# Patient Record
Sex: Female | Born: 1970
Health system: Southern US, Community
[De-identification: ages and names within clinical notes are randomized; demographics above are authoritative.]

## PROBLEM LIST (undated history)

## (undated) DIAGNOSIS — F988 Other specified behavioral and emotional disorders with onset usually occurring in childhood and adolescence: Secondary | ICD-10-CM

## (undated) DIAGNOSIS — I10 Essential (primary) hypertension: Secondary | ICD-10-CM

## (undated) DIAGNOSIS — M549 Dorsalgia, unspecified: Secondary | ICD-10-CM

## (undated) DIAGNOSIS — C539 Malignant neoplasm of cervix uteri, unspecified: Secondary | ICD-10-CM

## (undated) DIAGNOSIS — C55 Malignant neoplasm of uterus, part unspecified: Secondary | ICD-10-CM

## (undated) DIAGNOSIS — J302 Other seasonal allergic rhinitis: Secondary | ICD-10-CM

## (undated) DIAGNOSIS — F329 Major depressive disorder, single episode, unspecified: Secondary | ICD-10-CM

## (undated) DIAGNOSIS — B019 Varicella without complication: Secondary | ICD-10-CM

## (undated) DIAGNOSIS — K829 Disease of gallbladder, unspecified: Secondary | ICD-10-CM

## (undated) DIAGNOSIS — K219 Gastro-esophageal reflux disease without esophagitis: Secondary | ICD-10-CM

## (undated) DIAGNOSIS — F419 Anxiety disorder, unspecified: Secondary | ICD-10-CM

## (undated) HISTORY — DX: Malignant neoplasm of cervix uteri, unspecified: C53.9

## (undated) HISTORY — DX: Essential (primary) hypertension: I10

## (undated) HISTORY — DX: Anxiety disorder, unspecified: F41.9

## (undated) HISTORY — DX: Gastro-esophageal reflux disease without esophagitis: K21.9

## (undated) HISTORY — DX: Other seasonal allergic rhinitis: J30.2

## (undated) HISTORY — DX: Other specified behavioral and emotional disorders with onset usually occurring in childhood and adolescence: F98.8

## (undated) HISTORY — DX: Dorsalgia, unspecified: M54.9

## (undated) HISTORY — PX: CHOLECYSTECTOMY: SHX55

## (undated) HISTORY — DX: Varicella without complication: B01.9

## (undated) HISTORY — PX: ANKLE SURGERY: SHX546

## (undated) HISTORY — DX: Disease of gallbladder, unspecified: K82.9

## (undated) HISTORY — DX: Major depressive disorder, single episode, unspecified: F32.9

## (undated) HISTORY — PX: LEG SURGERY: SHX1003

## (undated) HISTORY — DX: Malignant neoplasm of uterus, part unspecified: C55

---

## 2000-01-12 ENCOUNTER — Other Ambulatory Visit: Admission: RE | Admit: 2000-01-12 | Discharge: 2000-01-12 | Payer: Self-pay | Admitting: Obstetrics and Gynecology

## 2001-02-07 ENCOUNTER — Other Ambulatory Visit: Admission: RE | Admit: 2001-02-07 | Discharge: 2001-02-07 | Payer: Self-pay | Admitting: Obstetrics and Gynecology

## 2002-03-27 ENCOUNTER — Other Ambulatory Visit: Admission: RE | Admit: 2002-03-27 | Discharge: 2002-03-27 | Payer: Self-pay | Admitting: Obstetrics and Gynecology

## 2003-05-05 ENCOUNTER — Other Ambulatory Visit: Admission: RE | Admit: 2003-05-05 | Discharge: 2003-05-05 | Payer: Self-pay | Admitting: Obstetrics and Gynecology

## 2004-05-19 ENCOUNTER — Other Ambulatory Visit: Admission: RE | Admit: 2004-05-19 | Discharge: 2004-05-19 | Payer: Self-pay | Admitting: Obstetrics and Gynecology

## 2005-05-25 ENCOUNTER — Other Ambulatory Visit: Admission: RE | Admit: 2005-05-25 | Discharge: 2005-05-25 | Payer: Self-pay | Admitting: Obstetrics and Gynecology

## 2005-06-23 ENCOUNTER — Observation Stay (HOSPITAL_COMMUNITY): Admission: EM | Admit: 2005-06-23 | Discharge: 2005-06-23 | Payer: Self-pay | Admitting: Emergency Medicine

## 2006-01-11 ENCOUNTER — Ambulatory Visit: Payer: Self-pay | Admitting: Family Medicine

## 2006-02-15 ENCOUNTER — Ambulatory Visit: Payer: Self-pay | Admitting: Family Medicine

## 2006-08-23 ENCOUNTER — Ambulatory Visit: Payer: Self-pay | Admitting: Family Medicine

## 2006-12-25 DIAGNOSIS — M869 Osteomyelitis, unspecified: Secondary | ICD-10-CM | POA: Insufficient documentation

## 2007-01-17 ENCOUNTER — Ambulatory Visit: Payer: Self-pay | Admitting: Family Medicine

## 2007-02-27 DIAGNOSIS — F32A Depression, unspecified: Secondary | ICD-10-CM

## 2007-02-27 HISTORY — DX: Depression, unspecified: F32.A

## 2007-10-26 ENCOUNTER — Emergency Department (HOSPITAL_COMMUNITY): Admission: EM | Admit: 2007-10-26 | Discharge: 2007-10-26 | Payer: Self-pay | Admitting: Emergency Medicine

## 2007-11-18 ENCOUNTER — Ambulatory Visit: Payer: Self-pay | Admitting: Family Medicine

## 2007-11-18 DIAGNOSIS — R300 Dysuria: Secondary | ICD-10-CM | POA: Insufficient documentation

## 2007-11-18 LAB — CONVERTED CEMR LAB
Bilirubin Urine: NEGATIVE
Blood in Urine, dipstick: NEGATIVE
Ketones, urine, test strip: NEGATIVE
Nitrite: NEGATIVE
Protein, U semiquant: NEGATIVE
Specific Gravity, Urine: <1.005
Urobilinogen, UA: 0.2
WBC Urine, dipstick: NEGATIVE

## 2007-11-19 ENCOUNTER — Encounter: Payer: Self-pay | Admitting: Family Medicine

## 2007-11-21 ENCOUNTER — Encounter (INDEPENDENT_AMBULATORY_CARE_PROVIDER_SITE_OTHER): Payer: Self-pay | Admitting: *Deleted

## 2007-11-26 ENCOUNTER — Encounter: Payer: Self-pay | Admitting: Family Medicine

## 2008-02-27 DIAGNOSIS — C55 Malignant neoplasm of uterus, part unspecified: Secondary | ICD-10-CM

## 2008-02-27 DIAGNOSIS — C539 Malignant neoplasm of cervix uteri, unspecified: Secondary | ICD-10-CM

## 2008-02-27 HISTORY — PX: ABDOMINAL HYSTERECTOMY: SHX81

## 2008-02-27 HISTORY — DX: Malignant neoplasm of cervix uteri, unspecified: C53.9

## 2008-02-27 HISTORY — DX: Malignant neoplasm of uterus, part unspecified: C55

## 2008-06-02 ENCOUNTER — Ambulatory Visit: Payer: Self-pay | Admitting: Family Medicine

## 2008-06-02 DIAGNOSIS — R3915 Urgency of urination: Secondary | ICD-10-CM | POA: Insufficient documentation

## 2008-06-02 LAB — CONVERTED CEMR LAB
Bilirubin Urine: NEGATIVE
Blood in Urine, dipstick: NEGATIVE
Protein, U semiquant: NEGATIVE
Specific Gravity, Urine: >=1.03
Urobilinogen, UA: NEGATIVE
WBC Urine, dipstick: NEGATIVE

## 2008-06-03 ENCOUNTER — Encounter: Payer: Self-pay | Admitting: Family Medicine

## 2008-06-07 ENCOUNTER — Encounter (INDEPENDENT_AMBULATORY_CARE_PROVIDER_SITE_OTHER): Payer: Self-pay | Admitting: *Deleted

## 2008-06-24 ENCOUNTER — Ambulatory Visit: Payer: Self-pay | Admitting: Family Medicine

## 2008-06-24 DIAGNOSIS — F411 Generalized anxiety disorder: Secondary | ICD-10-CM

## 2008-06-24 LAB — CONVERTED CEMR LAB
ALT: 20 units/L (ref 0–35)
AST: 21 units/L (ref 0–37)
Alkaline Phosphatase: 66 units/L (ref 39–117)
Bilirubin Urine: NEGATIVE
CO2: 27 meq/L (ref 19–32)
Chloride: 110 meq/L (ref 96–112)
Cholesterol: 150 mg/dL (ref 0–200)
Creatinine, Ser: 0.9 mg/dL (ref 0.4–1.2)
GFR calc non Af Amer: 74.43 mL/min (ref >60)
Glucose, Bld: 99 mg/dL (ref 70–99)
HDL: 54.2 mg/dL (ref >39.00)
LDL Cholesterol: 82 mg/dL (ref 0–99)
Monocytes Absolute: 0.3 10*3/uL (ref 0.1–1.0)
Monocytes Relative: 5.3 % (ref 3.0–12.0)
Neutro Abs: 4.8 10*3/uL (ref 1.4–7.7)
Sodium: 141 meq/L (ref 135–145)
Specific Gravity, Urine: 1.02
TSH: 1.86 microintl units/mL (ref 0.35–5.50)
Total Bilirubin: 0.8 mg/dL (ref 0.3–1.2)
Total CHOL/HDL Ratio: 3
Total Protein: 6.7 g/dL (ref 6.0–8.3)
Triglycerides: 70 mg/dL (ref 0.0–149.0)
Urobilinogen, UA: NEGATIVE
VLDL: 14 mg/dL (ref 0.0–40.0)
WBC: 6.5 10*3/uL (ref 4.5–10.5)

## 2008-06-25 ENCOUNTER — Encounter (INDEPENDENT_AMBULATORY_CARE_PROVIDER_SITE_OTHER): Payer: Self-pay | Admitting: *Deleted

## 2008-07-16 ENCOUNTER — Encounter: Admission: RE | Admit: 2008-07-16 | Discharge: 2008-07-16 | Payer: Self-pay | Admitting: Obstetrics and Gynecology

## 2008-11-15 ENCOUNTER — Ambulatory Visit (HOSPITAL_COMMUNITY): Admission: RE | Admit: 2008-11-15 | Discharge: 2008-11-15 | Payer: Self-pay | Admitting: Obstetrics and Gynecology

## 2008-11-15 ENCOUNTER — Encounter (INDEPENDENT_AMBULATORY_CARE_PROVIDER_SITE_OTHER): Payer: Self-pay | Admitting: Obstetrics and Gynecology

## 2008-12-10 ENCOUNTER — Ambulatory Visit (HOSPITAL_COMMUNITY): Admission: RE | Admit: 2008-12-10 | Discharge: 2008-12-11 | Payer: Self-pay | Admitting: Obstetrics and Gynecology

## 2008-12-10 ENCOUNTER — Encounter (INDEPENDENT_AMBULATORY_CARE_PROVIDER_SITE_OTHER): Payer: Self-pay | Admitting: Obstetrics and Gynecology

## 2009-02-24 ENCOUNTER — Telehealth (INDEPENDENT_AMBULATORY_CARE_PROVIDER_SITE_OTHER): Payer: Self-pay | Admitting: *Deleted

## 2009-11-23 ENCOUNTER — Encounter: Admission: RE | Admit: 2009-11-23 | Discharge: 2009-11-23 | Payer: Self-pay | Admitting: Obstetrics and Gynecology

## 2010-06-01 LAB — CBC
Hemoglobin: 11 g/dL — ABNORMAL LOW (ref 12.0–15.0)
MCHC: 34.3 g/dL (ref 30.0–36.0)
MCHC: 34.5 g/dL (ref 30.0–36.0)
MCV: 86.8 fL (ref 78.0–100.0)
Platelets: 305 10*3/uL (ref 150–400)
RBC: 3.69 MIL/uL — ABNORMAL LOW (ref 3.87–5.11)
RDW: 12.8 % (ref 11.5–15.5)

## 2010-06-01 LAB — PREGNANCY, URINE: Preg Test, Ur: NEGATIVE

## 2010-06-01 LAB — TYPE AND SCREEN: ABO/RH(D): A NEG

## 2010-06-01 LAB — ABO/RH: ABO/RH(D): A NEG

## 2010-06-02 LAB — CBC
HCT: 40 % (ref 36.0–46.0)
MCV: 86.1 fL (ref 78.0–100.0)
RDW: 12.9 % (ref 11.5–15.5)

## 2010-07-14 NOTE — Op Note (Signed)
NAME:  Kristi Ramos, Kristi Ramos NO.:  [id number]   MEDICAL RECORD NO.:  [id number]          PATIENT TYPE:  OBV   LOCATION:  1619                         FACILITY:  [facility]   PHYSICIAN:  Sharolyn Douglas, M.D.        DATE OF BIRTH:  [birth year]   DATE OF PROCEDURE:  06/23/2005  DATE OF DISCHARGE:                                 OPERATIVE REPORT   DIAGNOSIS:  Open right non displaced tibia fracture secondary to projectile  foreign body.   PROCEDURE:  1.  Incision and drainage of right tibia open fracture wound.  2.  Exploration of right posterior calf for foreign body.   SURGEON:  Sharolyn Douglas, MD   ASSISTANT:  Verlin Fester, P.A.   ANESTHESIA:  General endotracheal.   ESTIMATED BLOOD LOSS:  Minimal.   COMPLICATIONS:  None.   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  She felt severe immediate  pain in her right calf.  She looked down and there was a small pinpoint  wound.  She was taken to [facility].  She had x-rays and CT scan  made.  There was evidence of gas in the soft tissues and muscle with a chip  fracture off the tibia.  There appeared to be a small subcutaneous foreign  body in the posterior lateral calf.  We discussed various treatment  modalities including doing nothing and treating with antibiotics versus  I&Ding the open fracture and exploring for the form body.  She was having  significant pain in the emergency room and felt like she could feel  something moving around.  She has elected to proceed with exploration and  I&D of the fracture.  I discussed the risk and benefits.   PROCEDURE:  She was identified in holding area, taken to the operating room  underwent general endotracheal anesthesia without difficulty, given  prophylactic IV antibiotics in the emergency room and also a tetanus shot.  The right lower extremity was prepped and draped usual sterile fashion.   The  we made 1 cm incision over the posterior lateral calf at the site of maximal  tenderness which was identified in the holding area.  There is a small area  of ecchymosis.  The subcutaneous tissues were explored.  We could not  identify any foreign body.  The small wound was irrigated.  Closed with 4-0  interrupted nylon suture.  We then turned our attention to the open fracture  wound.  The laceration was extended in each direction 1 cm.  The edges of  the laceration were excised back to fresh skin.  We explored the wound and  there was a tract down to the tibia which had been dissected by the foreign  body.  The wound was irrigated with copious amounts of irrigation.  All  necrotic tissue was debrided.  We then closed the surgical incision with 4-0  nylon suture.  The penetrating wound was closed very loosely with the nylon  suture.  Sterile dressing was applied.  The  patient was extubated without difficulty,  transferred to recovery in stable condition.  I spoke with Dr. Ninetta Lights from  the infectious disease and he recommended Augmentin.  We will follow her  wound closely in the postoperative period for any signs of infection.      Sharolyn Douglas, M.D.  Electronically Signed     MC/MEDQ  D:  06/23/2005  T:  06/25/2005  Job:  [id number]

## 2010-07-14 NOTE — Op Note (Signed)
NAME:  Kristi Ramos, Kristi Ramos              ACCOUNT NO.:  [id number]   MEDICAL RECORD NO.:  [id number]          PATIENT TYPE:  OBV   LOCATION:  1619                         FACILITY:  [facility]   PHYSICIAN:  Max Cohen, M.D.        DATE OF BIRTH:  05/10/1970   DATE OF PROCEDURE:  06/23/2005  DATE OF DISCHARGE:                                 OPERATIVE REPORT   DIAGNOSIS:  Open right non displaced tibia fracture secondary to projectile  foreign body.   PROCEDURE:  1.  Incision and drainage of right tibia open fracture wound.  2.  Exploration of right posterior calf for foreign body.   SURGEON:  Max Cohen, MD   ASSISTANT:  Colleen Mahar, P.A.   ANESTHESIA:  General endotracheal.   ESTIMATED BLOOD LOSS:  Minimal.   COMPLICATIONS:  None.   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  She felt severe immediate  pain in her right calf.  She looked down and there was a small pinpoint  wound.  She was taken to [facility].  She had x-rays and CT scan  made.  There was evidence of gas in the soft tissues and muscle with a chip  fracture off the tibia.  There appeared to be a small subcutaneous foreign  body in the posterior lateral calf.  We discussed various treatment  modalities including doing nothing and treating with antibiotics versus  I&Ding the open fracture and exploring for the form body.  She was having  significant pain in the emergency room and felt like she could feel  something moving around.  She has elected to proceed with exploration and  I&D of the fracture.  I discussed the risk and benefits.   PROCEDURE:  She was identified in holding area, taken to the operating room  underwent general endotracheal anesthesia without difficulty, given  prophylactic IV antibiotics in the emergency room and also a tetanus shot.  The right lower extremity was prepped and draped usual sterile fashion.   The  we made 1 cm incision over the posterior lateral calf at the site of maximal  tenderness which was identified in the holding area.  There is a small area  of ecchymosis.  The subcutaneous tissues were explored.  We could not  identify any foreign body.  The small wound was irrigated.  Closed with 4-0  interrupted nylon suture.  We then turned our attention to the open fracture  wound.  The laceration was extended in each direction 1 cm.  The edges of  the laceration were excised back to fresh skin.  We explored the wound and  there was a tract down to the tibia which had been dissected by the foreign  body.  The wound was irrigated with copious amounts of irrigation.  All  necrotic tissue was debrided.  We then closed the surgical incision with 4-0  nylon suture.  The penetrating wound was closed very loosely with the nylon  suture.  Sterile dressing was applied.  The   patient was extubated without difficulty,  transferred to recovery in stable condition.  I spoke with Dr. Hatcher from  the infectious disease and he recommended Augmentin.  We will follow her  wound closely in the postoperative period for any signs of infection.      Max Cohen, M.D.  Electronically Signed     MC/MEDQ  D:  06/23/2005  T:  06/25/2005  Job:  [id number] 

## 2011-01-29 ENCOUNTER — Other Ambulatory Visit: Payer: Self-pay | Admitting: Obstetrics and Gynecology

## 2011-01-29 DIAGNOSIS — Z1231 Encounter for screening mammogram for malignant neoplasm of breast: Secondary | ICD-10-CM

## 2011-02-21 ENCOUNTER — Ambulatory Visit
Admission: RE | Admit: 2011-02-21 | Discharge: 2011-02-21 | Disposition: A | Discharge status: Home / Self Care | Payer: 59 | Source: Ambulatory Visit | Attending: Obstetrics and Gynecology | Admitting: Obstetrics and Gynecology

## 2011-02-21 DIAGNOSIS — Z1231 Encounter for screening mammogram for malignant neoplasm of breast: Secondary | ICD-10-CM

## 2012-01-15 ENCOUNTER — Other Ambulatory Visit: Payer: Self-pay | Admitting: Obstetrics and Gynecology

## 2012-01-15 DIAGNOSIS — Z1231 Encounter for screening mammogram for malignant neoplasm of breast: Secondary | ICD-10-CM

## 2012-02-22 ENCOUNTER — Ambulatory Visit: Payer: 59

## 2013-11-20 ENCOUNTER — Ambulatory Visit (INDEPENDENT_AMBULATORY_CARE_PROVIDER_SITE_OTHER): Payer: BC Managed Care – PPO | Admitting: Family Medicine

## 2013-11-20 ENCOUNTER — Ambulatory Visit (HOSPITAL_BASED_OUTPATIENT_CLINIC_OR_DEPARTMENT_OTHER)
Admission: RE | Admit: 2013-11-20 | Discharge: 2013-11-20 | Disposition: A | Discharge status: Home / Self Care | Payer: BC Managed Care – PPO | Source: Ambulatory Visit | Attending: Family Medicine | Admitting: Family Medicine

## 2013-11-20 ENCOUNTER — Encounter: Payer: Self-pay | Admitting: Family Medicine

## 2013-11-20 ENCOUNTER — Other Ambulatory Visit: Payer: Self-pay | Admitting: Family Medicine

## 2013-11-20 VITALS — BP 147/90 | HR 72 | Temp 98.3°F | Ht 63.0 in | Wt 237.4 lb

## 2013-11-20 DIAGNOSIS — Z1231 Encounter for screening mammogram for malignant neoplasm of breast: Secondary | ICD-10-CM | POA: Diagnosis present

## 2013-11-20 DIAGNOSIS — Z0000 Encounter for general adult medical examination without abnormal findings: Secondary | ICD-10-CM

## 2013-11-20 DIAGNOSIS — I10 Essential (primary) hypertension: Secondary | ICD-10-CM

## 2013-11-20 DIAGNOSIS — K219 Gastro-esophageal reflux disease without esophagitis: Secondary | ICD-10-CM

## 2013-11-20 DIAGNOSIS — Z23 Encounter for immunization: Secondary | ICD-10-CM

## 2013-11-20 DIAGNOSIS — G4700 Insomnia, unspecified: Secondary | ICD-10-CM

## 2013-11-20 LAB — CBC WITH DIFFERENTIAL/PLATELET
BASOS ABS: 0 10*3/uL (ref 0.0–0.1)
BASOS PCT: 0.5 % (ref 0.0–3.0)
EOS ABS: 0.2 10*3/uL (ref 0.0–0.7)
Eosinophils Relative: 2.8 % (ref 0.0–5.0)
HCT: 36.2 % (ref 36.0–46.0)
HEMOGLOBIN: 12.3 g/dL (ref 12.0–15.0)
LYMPHS ABS: 1.3 10*3/uL (ref 0.7–4.0)
Lymphocytes Relative: 16.7 % (ref 12.0–46.0)
MCHC: 33.9 g/dL (ref 30.0–36.0)
MCV: 87.3 fl (ref 78.0–100.0)
MONO ABS: 0.3 10*3/uL (ref 0.1–1.0)
MONOS PCT: 4.4 % (ref 3.0–12.0)
NEUTROS ABS: 5.8 10*3/uL (ref 1.4–7.7)
NEUTROS PCT: 75.6 % (ref 43.0–77.0)
PLATELETS: 279 10*3/uL (ref 150.0–400.0)
RBC: 4.15 Mil/uL (ref 3.87–5.11)
RDW: 13.4 % (ref 11.5–15.5)
WBC: 7.6 10*3/uL (ref 4.0–10.5)

## 2013-11-20 LAB — HEPATIC FUNCTION PANEL
ALT: 49 U/L — ABNORMAL HIGH (ref 0–35)
AST: 28 U/L (ref 0–37)
Albumin: 3.9 g/dL (ref 3.5–5.2)
Alkaline Phosphatase: 80 U/L (ref 39–117)
Bilirubin, Direct: 0 mg/dL (ref 0.0–0.3)
Total Bilirubin: 0.3 mg/dL (ref 0.2–1.2)
Total Protein: 6.8 g/dL (ref 6.0–8.3)

## 2013-11-20 LAB — BASIC METABOLIC PANEL
BUN: 12 mg/dL (ref 6–23)
CO2: 24 mEq/L (ref 19–32)
Calcium: 8.7 mg/dL (ref 8.4–10.5)
Chloride: 104 mEq/L (ref 96–112)
Creatinine, Ser: 1 mg/dL (ref 0.4–1.2)
GFR: 63.42 mL/min (ref >60.00)
GLUCOSE: 117 mg/dL — AB (ref 70–99)
Potassium: 3.7 mEq/L (ref 3.5–5.1)
Sodium: 135 mEq/L (ref 135–145)

## 2013-11-20 LAB — TSH: TSH: 0.96 u[IU]/mL (ref 0.35–4.50)

## 2013-11-20 LAB — LIPID PANEL
CHOL/HDL RATIO: 3
CHOLESTEROL: 172 mg/dL (ref 0–200)
HDL: 56.4 mg/dL (ref >39.00)
LDL Cholesterol: 101 mg/dL — ABNORMAL HIGH (ref 0–99)
NonHDL: 115.6
Triglycerides: 75 mg/dL (ref 0.0–149.0)
VLDL: 15 mg/dL (ref 0.0–40.0)

## 2013-11-20 LAB — POCT URINALYSIS DIPSTICK
Bilirubin, UA: NEGATIVE
Glucose, UA: NEGATIVE
Ketones, UA: NEGATIVE
Leukocytes, UA: NEGATIVE
NITRITE UA: NEGATIVE
PH UA: 5
PROTEIN UA: NEGATIVE
RBC UA: NEGATIVE
Spec Grav, UA: >=1.03
Urobilinogen, UA: 2

## 2013-11-20 MED ORDER — OMEPRAZOLE 20 MG PO CPDR: 20.0000 mg | DELAYED_RELEASE_CAPSULE | Freq: Every day | ORAL | Status: DC

## 2013-11-20 MED ORDER — TRAZODONE HCL 50 MG PO TABS: 25.0000 mg | ORAL_TABLET | Freq: Every evening | ORAL | Status: DC | PRN

## 2013-11-20 MED ORDER — LISINOPRIL-HYDROCHLOROTHIAZIDE 10-12.5 MG PO TABS: 1.0000 | ORAL_TABLET | Freq: Every day | ORAL | Status: DC

## 2013-11-20 NOTE — Progress Notes (Signed)
Pre visit review using our clinic review tool, if applicable. No additional management support is needed unless otherwise documented below in the visit note. 

## 2013-11-20 NOTE — Patient Instructions (Signed)
Preventive Care for Adults A healthy lifestyle and preventive care can promote health and wellness. Preventive health guidelines for women include the following key practices.  A routine yearly physical is a good way to check with your health care provider about your health and preventive screening. It is a chance to share any concerns and updates on your health and to receive a thorough exam.  Visit your dentist for a routine exam and preventive care every 6 months. Brush your teeth twice a day and floss once a day. Good oral hygiene prevents tooth decay and gum disease.  The frequency of eye exams is based on your age, health, family medical history, use of contact lenses, and other factors. Follow your health care provider's recommendations for frequency of eye exams.  Eat a healthy diet. Foods like vegetables, fruits, whole grains, low-fat dairy products, and lean protein foods contain the nutrients you need without too many calories. Decrease your intake of foods high in solid fats, added sugars, and salt. Eat the right amount of calories for you.Get information about a proper diet from your health care provider, if necessary.  Regular physical exercise is one of the most important things you can do for your health. Most adults should get at least 150 minutes of moderate-intensity exercise (any activity that increases your heart rate and causes you to sweat) each week. In addition, most adults need muscle-strengthening exercises on 2 or more days a week.  Maintain a healthy weight. The body mass index (BMI) is a screening tool to identify possible weight problems. It provides an estimate of body fat based on height and weight. Your health care provider can find your BMI and can help you achieve or maintain a healthy weight.For adults 20 years and older:  A BMI below 18.5 is considered underweight.  A BMI of 18.5 to 24.9 is normal.  A BMI of 25 to 29.9 is considered overweight.  A BMI of  30 and above is considered obese.  Maintain normal blood lipids and cholesterol levels by exercising and minimizing your intake of saturated fat. Eat a balanced diet with plenty of fruit and vegetables. Blood tests for lipids and cholesterol should begin at age 76 and be repeated every 5 years. If your lipid or cholesterol levels are high, you are over 50, or you are at high risk for heart disease, you may need your cholesterol levels checked more frequently.Ongoing high lipid and cholesterol levels should be treated with medicines if diet and exercise are not working.  If you smoke, find out from your health care provider how to quit. If you do not use tobacco, do not start.  Lung cancer screening is recommended for adults aged 22-80 years who are at high risk for developing lung cancer because of a history of smoking. A yearly low-dose CT scan of the lungs is recommended for people who have at least a 30-pack-year history of smoking and are a current smoker or have quit within the past 15 years. A pack year of smoking is smoking an average of 1 pack of cigarettes a day for 1 year (for example: 1 pack a day for 30 years or 2 packs a day for 15 years). Yearly screening should continue until the smoker has stopped smoking for at least 15 years. Yearly screening should be stopped for people who develop a health problem that would prevent them from having lung cancer treatment.  If you are pregnant, do not drink alcohol. If you are breastfeeding,  be very cautious about drinking alcohol. If you are not pregnant and choose to drink alcohol, do not have more than 1 drink per day. One drink is considered to be 12 ounces (355 mL) of beer, 5 ounces (148 mL) of wine, or 1.5 ounces (44 mL) of liquor.  Avoid use of street drugs. Do not share needles with anyone. Ask for help if you need support or instructions about stopping the use of drugs.  High blood pressure causes heart disease and increases the risk of  stroke. Your blood pressure should be checked at least every 1 to 2 years. Ongoing high blood pressure should be treated with medicines if weight loss and exercise do not work.  If you are 75-52 years old, ask your health care provider if you should take aspirin to prevent strokes.  Diabetes screening involves taking a blood sample to check your fasting blood sugar level. This should be done once every 3 years, after age 15, if you are within normal weight and without risk factors for diabetes. Testing should be considered at a younger age or be carried out more frequently if you are overweight and have at least 1 risk factor for diabetes.  Breast cancer screening is essential preventive care for women. You should practice "breast self-awareness." This means understanding the normal appearance and feel of your breasts and may include breast self-examination. Any changes detected, no matter how small, should be reported to a health care provider. Women in their 58s and 30s should have a clinical breast exam (CBE) by a health care provider as part of a regular health exam every 1 to 3 years. After age 16, women should have a CBE every year. Starting at age 53, women should consider having a mammogram (breast X-ray test) every year. Women who have a family history of breast cancer should talk to their health care provider about genetic screening. Women at a high risk of breast cancer should talk to their health care providers about having an MRI and a mammogram every year.  Breast cancer gene (BRCA)-related cancer risk assessment is recommended for women who have family members with BRCA-related cancers. BRCA-related cancers include breast, ovarian, tubal, and peritoneal cancers. Having family members with these cancers may be associated with an increased risk for harmful changes (mutations) in the breast cancer genes BRCA1 and BRCA2. Results of the assessment will determine the need for genetic counseling and  BRCA1 and BRCA2 testing.  Routine pelvic exams to screen for cancer are no longer recommended for nonpregnant women who are considered low risk for cancer of the pelvic organs (ovaries, uterus, and vagina) and who do not have symptoms. Ask your health care provider if a screening pelvic exam is right for you.  If you have had past treatment for cervical cancer or a condition that could lead to cancer, you need Pap tests and screening for cancer for at least 20 years after your treatment. If Pap tests have been discontinued, your risk factors (such as having a new sexual partner) need to be reassessed to determine if screening should be resumed. Some women have medical problems that increase the chance of getting cervical cancer. In these cases, your health care provider may recommend more frequent screening and Pap tests.  The HPV test is an additional test that may be used for cervical cancer screening. The HPV test looks for the virus that can cause the cell changes on the cervix. The cells collected during the Pap test can be  tested for HPV. The HPV test could be used to screen women aged 30 years and older, and should be used in women of any age who have unclear Pap test results. After the age of 30, women should have HPV testing at the same frequency as a Pap test.  Colorectal cancer can be detected and often prevented. Most routine colorectal cancer screening begins at the age of 50 years and continues through age 75 years. However, your health care provider may recommend screening at an earlier age if you have risk factors for colon cancer. On a yearly basis, your health care provider may provide home test kits to check for hidden blood in the stool. Use of a small camera at the end of a tube, to directly examine the colon (sigmoidoscopy or colonoscopy), can detect the earliest forms of colorectal cancer. Talk to your health care provider about this at age 50, when routine screening begins. Direct  exam of the colon should be repeated every 5-10 years through age 75 years, unless early forms of pre-cancerous polyps or small growths are found.  People who are at an increased risk for hepatitis B should be screened for this virus. You are considered at high risk for hepatitis B if:  You were born in a country where hepatitis B occurs often. Talk with your health care provider about which countries are considered high risk.  Your parents were born in a high-risk country and you have not received a shot to protect against hepatitis B (hepatitis B vaccine).  You have HIV or AIDS.  You use needles to inject street drugs.  You live with, or have sex with, someone who has hepatitis B.  You get hemodialysis treatment.  You take certain medicines for conditions like cancer, organ transplantation, and autoimmune conditions.  Hepatitis C blood testing is recommended for all people born from 1945 through 1965 and any individual with known risks for hepatitis C.  Practice safe sex. Use condoms and avoid high-risk sexual practices to reduce the spread of sexually transmitted infections (STIs). STIs include gonorrhea, chlamydia, syphilis, trichomonas, herpes, HPV, and human immunodeficiency virus (HIV). Herpes, HIV, and HPV are viral illnesses that have no cure. They can result in disability, cancer, and death.  You should be screened for sexually transmitted illnesses (STIs) including gonorrhea and chlamydia if:  You are sexually active and are younger than 24 years.  You are older than 24 years and your health care provider tells you that you are at risk for this type of infection.  Your sexual activity has changed since you were last screened and you are at an increased risk for chlamydia or gonorrhea. Ask your health care provider if you are at risk.  If you are at risk of being infected with HIV, it is recommended that you take a prescription medicine daily to prevent HIV infection. This is  called preexposure prophylaxis (PrEP). You are considered at risk if:  You are a heterosexual woman, are sexually active, and are at increased risk for HIV infection.  You take drugs by injection.  You are sexually active with a partner who has HIV.  Talk with your health care provider about whether you are at high risk of being infected with HIV. If you choose to begin PrEP, you should first be tested for HIV. You should then be tested every 3 months for as long as you are taking PrEP.  Osteoporosis is a disease in which the bones lose minerals and strength   with aging. This can result in serious bone fractures or breaks. The risk of osteoporosis can be identified using a bone density scan. Women ages 65 years and over and women at risk for fractures or osteoporosis should discuss screening with their health care providers. Ask your health care provider whether you should take a calcium supplement or vitamin D to reduce the rate of osteoporosis.  Menopause can be associated with physical symptoms and risks. Hormone replacement therapy is available to decrease symptoms and risks. You should talk to your health care provider about whether hormone replacement therapy is right for you.  Use sunscreen. Apply sunscreen liberally and repeatedly throughout the day. You should seek shade when your shadow is shorter than you. Protect yourself by wearing long sleeves, pants, a wide-brimmed hat, and sunglasses year round, whenever you are outdoors.  Once a month, do a whole body skin exam, using a mirror to look at the skin on your back. Tell your health care provider of new moles, moles that have irregular borders, moles that are larger than a pencil eraser, or moles that have changed in shape or color.  Stay current with required vaccines (immunizations).  Influenza vaccine. All adults should be immunized every year.  Tetanus, diphtheria, and acellular pertussis (Td, Tdap) vaccine. Pregnant women should  receive 1 dose of Tdap vaccine during each pregnancy. The dose should be obtained regardless of the length of time since the last dose. Immunization is preferred during the 27th-36th week of gestation. An adult who has not previously received Tdap or who does not know her vaccine status should receive 1 dose of Tdap. This initial dose should be followed by tetanus and diphtheria toxoids (Td) booster doses every 10 years. Adults with an unknown or incomplete history of completing a 3-dose immunization series with Td-containing vaccines should begin or complete a primary immunization series including a Tdap dose. Adults should receive a Td booster every 10 years.  Varicella vaccine. An adult without evidence of immunity to varicella should receive 2 doses or a second dose if she has previously received 1 dose. Pregnant females who do not have evidence of immunity should receive the first dose after pregnancy. This first dose should be obtained before leaving the health care facility. The second dose should be obtained 4-8 weeks after the first dose.  Human papillomavirus (HPV) vaccine. Females aged 13-26 years who have not received the vaccine previously should obtain the 3-dose series. The vaccine is not recommended for use in pregnant females. However, pregnancy testing is not needed before receiving a dose. If a female is found to be pregnant after receiving a dose, no treatment is needed. In that case, the remaining doses should be delayed until after the pregnancy. Immunization is recommended for any person with an immunocompromised condition through the age of 26 years if she did not get any or all doses earlier. During the 3-dose series, the second dose should be obtained 4-8 weeks after the first dose. The third dose should be obtained 24 weeks after the first dose and 16 weeks after the second dose.  Zoster vaccine. One dose is recommended for adults aged 60 years or older unless certain conditions are  present.  Measles, mumps, and rubella (MMR) vaccine. Adults born before 1957 generally are considered immune to measles and mumps. Adults born in 1957 or later should have 1 or more doses of MMR vaccine unless there is a contraindication to the vaccine or there is laboratory evidence of immunity to   each of the three diseases. A routine second dose of MMR vaccine should be obtained at least 28 days after the first dose for students attending postsecondary schools, health care workers, or international travelers. People who received inactivated measles vaccine or an unknown type of measles vaccine during 1963-1967 should receive 2 doses of MMR vaccine. People who received inactivated mumps vaccine or an unknown type of mumps vaccine before 1979 and are at high risk for mumps infection should consider immunization with 2 doses of MMR vaccine. For females of childbearing age, rubella immunity should be determined. If there is no evidence of immunity, females who are not pregnant should be vaccinated. If there is no evidence of immunity, females who are pregnant should delay immunization until after pregnancy. Unvaccinated health care workers born before 1957 who lack laboratory evidence of measles, mumps, or rubella immunity or laboratory confirmation of disease should consider measles and mumps immunization with 2 doses of MMR vaccine or rubella immunization with 1 dose of MMR vaccine.  Pneumococcal 13-valent conjugate (PCV13) vaccine. When indicated, a person who is uncertain of her immunization history and has no record of immunization should receive the PCV13 vaccine. An adult aged 19 years or older who has certain medical conditions and has not been previously immunized should receive 1 dose of PCV13 vaccine. This PCV13 should be followed with a dose of pneumococcal polysaccharide (PPSV23) vaccine. The PPSV23 vaccine dose should be obtained at least 8 weeks after the dose of PCV13 vaccine. An adult aged 19  years or older who has certain medical conditions and previously received 1 or more doses of PPSV23 vaccine should receive 1 dose of PCV13. The PCV13 vaccine dose should be obtained 1 or more years after the last PPSV23 vaccine dose.  Pneumococcal polysaccharide (PPSV23) vaccine. When PCV13 is also indicated, PCV13 should be obtained first. All adults aged 65 years and older should be immunized. An adult younger than age 65 years who has certain medical conditions should be immunized. Any person who resides in a nursing home or long-term care facility should be immunized. An adult smoker should be immunized. People with an immunocompromised condition and certain other conditions should receive both PCV13 and PPSV23 vaccines. People with human immunodeficiency virus (HIV) infection should be immunized as soon as possible after diagnosis. Immunization during chemotherapy or radiation therapy should be avoided. Routine use of PPSV23 vaccine is not recommended for American Indians, [place] Natives, or people younger than 65 years unless there are medical conditions that require PPSV23 vaccine. When indicated, people who have unknown immunization and have no record of immunization should receive PPSV23 vaccine. One-time revaccination 5 years after the first dose of PPSV23 is recommended for people aged 19-64 years who have chronic kidney failure, nephrotic syndrome, asplenia, or immunocompromised conditions. People who received 1-2 doses of PPSV23 before age 65 years should receive another dose of PPSV23 vaccine at age 65 years or later if at least 5 years have passed since the previous dose. Doses of PPSV23 are not needed for people immunized with PPSV23 at or after age 65 years.  Meningococcal vaccine. Adults with asplenia or persistent complement component deficiencies should receive 2 doses of quadrivalent meningococcal conjugate (MenACWY-D) vaccine. The doses should be obtained at least 2 months apart.  Microbiologists working with certain meningococcal bacteria, military recruits, people at risk during an outbreak, and people who travel to or live in countries with a high rate of meningitis should be immunized. A first-year college student up through age   21 years who is living in a residence hall should receive a dose if she did not receive a dose on or after her 16th birthday. Adults who have certain high-risk conditions should receive one or more doses of vaccine.  Hepatitis A vaccine. Adults who wish to be protected from this disease, have certain high-risk conditions, work with hepatitis A-infected animals, work in hepatitis A research labs, or travel to or work in countries with a high rate of hepatitis A should be immunized. Adults who were previously unvaccinated and who anticipate close contact with an international adoptee during the first 60 days after arrival in the [place] from a country with a high rate of hepatitis A should be immunized.  Hepatitis B vaccine. Adults who wish to be protected from this disease, have certain high-risk conditions, may be exposed to blood or other infectious body fluids, are household contacts or sex partners of hepatitis B positive people, are clients or workers in certain care facilities, or travel to or work in countries with a high rate of hepatitis B should be immunized.  Haemophilus influenzae type b (Hib) vaccine. A previously unvaccinated person with asplenia or sickle cell disease or having a scheduled splenectomy should receive 1 dose of Hib vaccine. Regardless of previous immunization, a recipient of a hematopoietic stem cell transplant should receive a 3-dose series 6-12 months after her successful transplant. Hib vaccine is not recommended for adults with HIV infection. Preventive Services / Frequency Ages 64 to 68 years  Blood pressure check.** / Every 1 to 2 years.  Lipid and cholesterol check.** / Every 5 years beginning at age  22.  Clinical breast exam.** / Every 3 years for women in their 88s and 53s.  BRCA-related cancer risk assessment.** / For women who have family members with a BRCA-related cancer (breast, ovarian, tubal, or peritoneal cancers).  Pap test.** / Every 2 years from ages 90 through 51. Every 3 years starting at age 21 through age 56 or 3 with a history of 3 consecutive normal Pap tests.  HPV screening.** / Every 3 years from ages 24 through ages 1 to 46 with a history of 3 consecutive normal Pap tests.  Hepatitis C blood test.** / For any individual with known risks for hepatitis C.  Skin self-exam. / Monthly.  Influenza vaccine. / Every year.  Tetanus, diphtheria, and acellular pertussis (Tdap, Td) vaccine.** / Consult your health care provider. Pregnant women should receive 1 dose of Tdap vaccine during each pregnancy. 1 dose of Td every 10 years.  Varicella vaccine.** / Consult your health care provider. Pregnant females who do not have evidence of immunity should receive the first dose after pregnancy.  HPV vaccine. / 3 doses over 6 months, if 72 and younger. The vaccine is not recommended for use in pregnant females. However, pregnancy testing is not needed before receiving a dose.  Measles, mumps, rubella (MMR) vaccine.** / You need at least 1 dose of MMR if you were born in 1957 or later. You may also need a 2nd dose. For females of childbearing age, rubella immunity should be determined. If there is no evidence of immunity, females who are not pregnant should be vaccinated. If there is no evidence of immunity, females who are pregnant should delay immunization until after pregnancy.  Pneumococcal 13-valent conjugate (PCV13) vaccine.** / Consult your health care provider.  Pneumococcal polysaccharide (PPSV23) vaccine.** / 1 to 2 doses if you smoke cigarettes or if you have certain conditions.  Meningococcal vaccine.** /  1 dose if you are age 19 to 21 years and a first-year college  student living in a residence hall, or have one of several medical conditions, you need to get vaccinated against meningococcal disease. You may also need additional booster doses.  Hepatitis A vaccine.** / Consult your health care provider.  Hepatitis B vaccine.** / Consult your health care provider.  Haemophilus influenzae type b (Hib) vaccine.** / Consult your health care provider. Ages 40 to 64 years  Blood pressure check.** / Every 1 to 2 years.  Lipid and cholesterol check.** / Every 5 years beginning at age 20 years.  Lung cancer screening. / Every year if you are aged 55-80 years and have a 30-pack-year history of smoking and currently smoke or have quit within the past 15 years. Yearly screening is stopped once you have quit smoking for at least 15 years or develop a health problem that would prevent you from having lung cancer treatment.  Clinical breast exam.** / Every year after age 40 years.  BRCA-related cancer risk assessment.** / For women who have family members with a BRCA-related cancer (breast, ovarian, tubal, or peritoneal cancers).  Mammogram.** / Every year beginning at age 40 years and continuing for as long as you are in good health. Consult with your health care provider.  Pap test.** / Every 3 years starting at age 30 years through age 65 or 70 years with a history of 3 consecutive normal Pap tests.  HPV screening.** / Every 3 years from ages 30 years through ages 65 to 70 years with a history of 3 consecutive normal Pap tests.  Fecal occult blood test (FOBT) of stool. / Every year beginning at age 50 years and continuing until age 75 years. You may not need to do this test if you get a colonoscopy every 10 years.  Flexible sigmoidoscopy or colonoscopy.** / Every 5 years for a flexible sigmoidoscopy or every 10 years for a colonoscopy beginning at age 50 years and continuing until age 75 years.  Hepatitis C blood test.** / For all people born from 1945 through  1965 and any individual with known risks for hepatitis C.  Skin self-exam. / Monthly.  Influenza vaccine. / Every year.  Tetanus, diphtheria, and acellular pertussis (Tdap/Td) vaccine.** / Consult your health care provider. Pregnant women should receive 1 dose of Tdap vaccine during each pregnancy. 1 dose of Td every 10 years.  Varicella vaccine.** / Consult your health care provider. Pregnant females who do not have evidence of immunity should receive the first dose after pregnancy.  Zoster vaccine.** / 1 dose for adults aged 60 years or older.  Measles, mumps, rubella (MMR) vaccine.** / You need at least 1 dose of MMR if you were born in 1957 or later. You may also need a 2nd dose. For females of childbearing age, rubella immunity should be determined. If there is no evidence of immunity, females who are not pregnant should be vaccinated. If there is no evidence of immunity, females who are pregnant should delay immunization until after pregnancy.  Pneumococcal 13-valent conjugate (PCV13) vaccine.** / Consult your health care provider.  Pneumococcal polysaccharide (PPSV23) vaccine.** / 1 to 2 doses if you smoke cigarettes or if you have certain conditions.  Meningococcal vaccine.** / Consult your health care provider.  Hepatitis A vaccine.** / Consult your health care provider.  Hepatitis B vaccine.** / Consult your health care provider.  Haemophilus influenzae type b (Hib) vaccine.** / Consult your health care provider. Ages 65   years and over  Blood pressure check.** / Every 1 to 2 years.  Lipid and cholesterol check.** / Every 5 years beginning at age 22 years.  Lung cancer screening. / Every year if you are aged 73-80 years and have a 30-pack-year history of smoking and currently smoke or have quit within the past 15 years. Yearly screening is stopped once you have quit smoking for at least 15 years or develop a health problem that would prevent you from having lung cancer  treatment.  Clinical breast exam.** / Every year after age 4 years.  BRCA-related cancer risk assessment.** / For women who have family members with a BRCA-related cancer (breast, ovarian, tubal, or peritoneal cancers).  Mammogram.** / Every year beginning at age 40 years and continuing for as long as you are in good health. Consult with your health care provider.  Pap test.** / Every 3 years starting at age 9 years through age 34 or 91 years with 3 consecutive normal Pap tests. Testing can be stopped between 65 and 70 years with 3 consecutive normal Pap tests and no abnormal Pap or HPV tests in the past 10 years.  HPV screening.** / Every 3 years from ages 57 years through ages 64 or 45 years with a history of 3 consecutive normal Pap tests. Testing can be stopped between 65 and 70 years with 3 consecutive normal Pap tests and no abnormal Pap or HPV tests in the past 10 years.  Fecal occult blood test (FOBT) of stool. / Every year beginning at age 15 years and continuing until age 17 years. You may not need to do this test if you get a colonoscopy every 10 years.  Flexible sigmoidoscopy or colonoscopy.** / Every 5 years for a flexible sigmoidoscopy or every 10 years for a colonoscopy beginning at age 86 years and continuing until age 71 years.  Hepatitis C blood test.** / For all people born from [birth year] through 1965 and any individual with known risks for hepatitis C.  Osteoporosis screening.** / A one-time screening for women ages 83 years and over and women at risk for fractures or osteoporosis.  Skin self-exam. / Monthly.  Influenza vaccine. / Every year.  Tetanus, diphtheria, and acellular pertussis (Tdap/Td) vaccine.** / 1 dose of Td every 10 years.  Varicella vaccine.** / Consult your health care provider.  Zoster vaccine.** / 1 dose for adults aged 61 years or older.  Pneumococcal 13-valent conjugate (PCV13) vaccine.** / Consult your health care provider.  Pneumococcal  polysaccharide (PPSV23) vaccine.** / 1 dose for all adults aged 28 years and older.  Meningococcal vaccine.** / Consult your health care provider.  Hepatitis A vaccine.** / Consult your health care provider.  Hepatitis B vaccine.** / Consult your health care provider.  Haemophilus influenzae type b (Hib) vaccine.** / Consult your health care provider. ** Family history and personal history of risk and conditions may change your health care provider's recommendations. Document Released: 04/10/2001 Document Revised: 06/29/2013 Document Reviewed: 07/10/2010 [facility] Patient Information 2015 [place], [place]. This information is not intended to replace advice given to you by your health care provider. Make sure you discuss any questions you have with your health care provider.

## 2013-11-20 NOTE — Progress Notes (Signed)
Subjective:     Kristi Ramos is a 43 y.o. female and is here for a comprehensive physical exam. The patient reports problems - elevated bp for last several months,  cyst on index finger.  History   Social History  . Marital Status: Divorced    Spouse Name: N/A    Number of Children: N/A  . Years of Education: N/A   Occupational History  .      dental hygiene   Social History Main Topics  . Smoking status: Never Smoker   . Smokeless tobacco: Never Used  . Alcohol Use: Yes     Comment: Occ  . Drug Use: No  . Sexual Activity: Yes    Partners: Male   Other Topics Concern  . Not on file   Social History Narrative   Exercise--- just started back   Health Maintenance  Topic Date Due  . Influenza Vaccine  09/27/2014  . Pap Smear  12/28/2014  . Tetanus/tdap  06/24/2015    The following portions of the patient's history were reviewed and updated as appropriate:  She  has a past medical history of Uterine cancer (2010); Cervical cancer (2010); Chicken pox; Depression (2009); GERD (gastroesophageal reflux disease); Seasonal allergies; and HTN (hypertension). She  does not have any pertinent problems on file. She  has past surgical history that includes Cholecystectomy; Ankle surgery (Right); Leg Surgery; and Abdominal hysterectomy (2010). Her family history includes Alcohol abuse in her maternal grandfather and paternal grandmother; Arthritis in her father and mother; Depression in her son; Drug abuse in her son; Heart attack in her paternal grandfather; Heart disease in her paternal grandfather; Hyperlipidemia in her father; Hypertension in her brother and mother; Stroke in her mother. She  reports that she has never smoked. She has never used smokeless tobacco. She reports that she drinks alcohol. She reports that she does not use illicit drugs. She has a current medication list which includes the following prescription(s): omeprazole, lisinopril-hydrochlorothiazide, omeprazole,  and trazodone. No current outpatient prescriptions on file prior to visit.   No current facility-administered medications on file prior to visit.   She is allergic to moxifloxacin..  Review of Systems Review of Systems  Constitutional: Negative for activity change, appetite change and fatigue.  HENT: Negative for hearing loss, congestion, tinnitus and ear discharge.  dentist q34m Eyes: Negative for visual disturbance (see optho q1y -- vision corrected to 20/20 with glasses).  Respiratory: Negative for cough, chest tightness and shortness of breath.   Cardiovascular: Negative for chest pain, palpitations and leg swelling.  Gastrointestinal: Negative for abdominal pain, diarrhea, constipation and abdominal distention.  Genitourinary: Negative for urgency, frequency, decreased urine volume and difficulty urinating.  Musculoskeletal: Negative for back pain, arthralgias and gait problem.  Skin: Negative for color change, pallor and rash.  Neurological: Negative for dizziness, light-headedness, numbness and headaches.  Hematological: Negative for adenopathy. Does not bruise/bleed easily.  Psychiatric/Behavioral: Negative for suicidal ideas, confusion, sleep disturbance, self-injury, dysphoric mood, decreased concentration and agitation.       Objective:    BP 147/90  Pulse 72  Temp(Src) 98.3 F (36.8 C) (Oral)  Ht 5\' 3"  (1.6 m)  Wt 237 lb 7 oz (107.7 kg)  BMI 42.07 kg/m2  SpO2 100% General appearance: alert, cooperative, appears stated age and no distress, obese Head: Normocephalic, without obvious abnormality, atraumatic Eyes: conjunctivae/corneas clear. PERRL, EOM's intact. Fundi benign. Ears: normal TM's and external ear canals both ears Nose: Nares normal. Septum midline. Mucosa normal. No drainage or  sinus tenderness. Throat: lips, mucosa, and tongue normal; teeth and gums normal Neck: no adenopathy, no carotid bruit, no JVD, supple, symmetrical, trachea midline and thyroid  not enlarged, symmetric, no tenderness/mass/nodules Back: symmetric, no curvature. ROM normal. No CVA tenderness. Lungs: clear to auscultation bilaterally Breasts: gyn Heart: regular rate and rhythm, S1, S2 normal, no murmur, click, rub or gallop Abdomen: soft, non-tender; bowel sounds normal; no masses,  no organomegaly Pelvic: deferred-gyn Extremities: extremities normal, atraumatic, no cyanosis or edema Pulses: 2+ and symmetric Skin: Skin color, texture, turgor normal. No rashes or lesions Lymph nodes: Cervical, supraclavicular, and axillary nodes normal. Neurologic: Alert and oriented X 3, normal strength and tone. Normal symmetric reflexes. Normal coordination and gait Psych--no depression      Assessment:    Healthy female exam.      Plan:    ghm utd  Check labs See After Visit Summary for Counseling Recommendations   1. Need for prophylactic vaccination and inoculation against influenza  - Flu Vaccine QUAD 36+ mos PF IM (Fluarix Quad PF)  2. Essential hypertension Start med-- recheck 2 weeks - lisinopril-hydrochlorothiazide (PRINZIDE,ZESTORETIC) 10-12.5 MG per tablet; Take 1 tablet by mouth daily.  Dispense: 30 tablet; Refill: 2 - Basic metabolic panel - CBC with Differential  3. Insomnia  - traZODone (DESYREL) 50 MG tablet; Take 0.5-1 tablets (25-50 mg total) by mouth at bedtime as needed for sleep.  Dispense: 30 tablet; Refill: 3  4. Preventative health care  - Basic metabolic panel - CBC with Differential - Hepatic function panel - Lipid panel - POCT urinalysis dipstick - TSH  5. Gastroesophageal reflux disease without esophagitis  - omeprazole (PRILOSEC) 20 MG capsule; Take 1 capsule (20 mg total) by mouth daily.  Dispense: 90 capsule; Refill: 3

## 2013-11-27 ENCOUNTER — Telehealth: Payer: Self-pay | Admitting: Family Medicine

## 2013-11-27 NOTE — Telephone Encounter (Signed)
Caller name:  Chessa Relation to pt: self Call back number: [phone] Pharmacy:  Reason for call:   Patient has questions regarding last labs. She states that she did receive the mychart message and the letter that was mailed but does not understand what they results mean

## 2013-11-30 NOTE — Telephone Encounter (Signed)
Notes Recorded by Ewing Schlein, CMA on 11/23/2013 at 2:48 PM Copy mailed to the address on file KP Notes Recorded by Rosalita Chessman, DO on 11/20/2013 at 9:24 PM Great! Recheck 1 year   Msg left to call the office     KP

## 2013-11-30 NOTE — Telephone Encounter (Signed)
Spoke with patient and she voiced understanding, she will be in the office in 3 weeks and will discuss her elevated BS at that time. Labs have already been received.      KP

## 2013-12-24 ENCOUNTER — Telehealth: Payer: Self-pay | Admitting: Family Medicine

## 2013-12-24 ENCOUNTER — Encounter: Payer: Self-pay | Admitting: Family Medicine

## 2013-12-24 ENCOUNTER — Ambulatory Visit (INDEPENDENT_AMBULATORY_CARE_PROVIDER_SITE_OTHER): Payer: BC Managed Care – PPO | Admitting: Family Medicine

## 2013-12-24 VITALS — BP 120/86 | HR 74 | Temp 98.0°F | Wt 239.0 lb

## 2013-12-24 DIAGNOSIS — R739 Hyperglycemia, unspecified: Secondary | ICD-10-CM

## 2013-12-24 DIAGNOSIS — I10 Essential (primary) hypertension: Secondary | ICD-10-CM

## 2013-12-24 DIAGNOSIS — G4700 Insomnia, unspecified: Secondary | ICD-10-CM

## 2013-12-24 LAB — BASIC METABOLIC PANEL
BUN: 12 mg/dL (ref 6–23)
CALCIUM: 8.6 mg/dL (ref 8.4–10.5)
CO2: 20 meq/L (ref 19–32)
Chloride: 103 mEq/L (ref 96–112)
Creatinine, Ser: 0.8 mg/dL (ref 0.4–1.2)
GFR: 84.18 mL/min (ref >60.00)
GLUCOSE: 106 mg/dL — AB (ref 70–99)
Potassium: 4 mEq/L (ref 3.5–5.1)
SODIUM: 136 meq/L (ref 135–145)

## 2013-12-24 LAB — HEMOGLOBIN A1C: Hgb A1c MFr Bld: 5.8 % (ref 4.6–6.5)

## 2013-12-24 MED ORDER — NALTREXONE-BUPROPION HCL ER 8-90 MG PO TB12: ORAL_TABLET | ORAL | Status: DC

## 2013-12-24 NOTE — Progress Notes (Signed)
Pre visit review using our clinic review tool, if applicable. No additional management support is needed unless otherwise documented below in the visit note. 

## 2013-12-24 NOTE — Patient Instructions (Signed)

## 2013-12-24 NOTE — Telephone Encounter (Signed)
Caller name:Broadhurst, Efrata Relation to pt: self  Call back number: [phone] Pharmacy:[facility] [phone]   Reason for call: Naltrexone-Bupropion HCl ER (CONTRAVE) 8-90 MG TB12 needs prio auth

## 2013-12-24 NOTE — Progress Notes (Signed)
  Subjective:    Patient here for follow-up of elevated blood pressure.  She is exercising and is adherent to a low-salt diet.  Blood pressure is well controlled at home. Cardiac symptoms: none. Patient denies: chest pain, chest pressure/discomfort, claudication, dyspnea, exertional chest pressure/discomfort, fatigue, irregular heart beat, lower extremity edema, near-syncope, orthopnea, palpitations, paroxysmal nocturnal dyspnea, syncope and tachypnea. Cardiovascular risk factors: hypertension and obesity (BMI >= 30 kg/m2). Use of agents associated with hypertension: none. History of target organ damage: none.  The following portions of the patient's history were reviewed and updated as appropriate: allergies, current medications, past family history, past medical history, past social history, past surgical history and problem list.  Review of Systems Pertinent items are noted in HPI.     Objective:    BP 120/86  Pulse 74  Temp(Src) 98 F (36.7 C) (Oral)  Wt 239 lb (108.41 kg)  SpO2 97% General appearance: alert, cooperative, appears stated age and no distress Throat: lips, mucosa, and tongue normal; teeth and gums normal Neck: no adenopathy, supple, symmetrical, trachea midline and thyroid not enlarged, symmetric, no tenderness/mass/nodules Lungs: clear to auscultation bilaterally Heart: S1, S2 normal Extremities: extremities normal, atraumatic, no cyanosis or edema    Assessment:    Hypertension, normal blood pressure . Evidence of target organ damage: none.    Plan:    Medication: no change. Dietary sodium restriction. Regular aerobic exercise. Follow up: 3 months and as needed.   1. Essential hypertension Check K-- eat banana or drink oj daily - Basic metabolic panel  2. Insomnia trazadone helps-- she occasionally ( on sun) has to take 100 mg to sleep  3. Hyperglycemia Check labs-  Watch simple sugars and starches  - Hemoglobin A1c  4. Morbid obesity Diet and  exercise discussed-- pt really struggles with wine and carbs - Naltrexone-Bupropion HCl ER (CONTRAVE) 8-90 MG TB12; 2 po bid  Dispense: 120 tablet; Refill: 2

## 2013-12-25 NOTE — Telephone Encounter (Signed)
Caller name: Broadhurst,Marcea  Relation to pt: self  Call back number: [phone] [facility]: [facility] [phone]  Reason for call: pt checking the status of prior-auth

## 2013-12-28 ENCOUNTER — Encounter: Payer: Self-pay | Admitting: Medical

## 2013-12-28 ENCOUNTER — Ambulatory Visit (INDEPENDENT_AMBULATORY_CARE_PROVIDER_SITE_OTHER): Payer: BC Managed Care – PPO | Admitting: Medical

## 2013-12-28 VITALS — BP 130/83 | HR 73 | Temp 98.6°F | Ht 63.0 in | Wt 237.6 lb

## 2013-12-28 DIAGNOSIS — R197 Diarrhea, unspecified: Secondary | ICD-10-CM | POA: Insufficient documentation

## 2013-12-28 DIAGNOSIS — R1033 Periumbilical pain: Secondary | ICD-10-CM

## 2013-12-28 DIAGNOSIS — R109 Unspecified abdominal pain: Secondary | ICD-10-CM | POA: Insufficient documentation

## 2013-12-28 DIAGNOSIS — R1013 Epigastric pain: Secondary | ICD-10-CM

## 2013-12-28 MED ORDER — OMEPRAZOLE 20 MG PO CPDR: 20.0000 mg | DELAYED_RELEASE_CAPSULE | Freq: Every day | ORAL | Status: DC

## 2013-12-28 MED ORDER — ONDANSETRON 8 MG PO TBDP: 8.0000 mg | ORAL_TABLET | Freq: Three times a day (TID) | ORAL | Status: DC | PRN

## 2013-12-28 MED ORDER — DIPHENOXYLATE-ATROPINE 2.5-0.025 MG PO TABS
1.0000 | ORAL_TABLET | Freq: Four times a day (QID) | ORAL | Status: DC | PRN
Start: 2013-12-28 — End: 2014-03-26

## 2013-12-28 NOTE — Telephone Encounter (Signed)
PA for contrave initiated through [employer]. Awaiting determination. JG//CMA

## 2013-12-28 NOTE — Telephone Encounter (Signed)
PT calling checking on the status of Naltrexone-Bupropion HCl ER (CONTRAVE) 8-90 MG TB12

## 2013-12-28 NOTE — Assessment & Plan Note (Signed)
Likely viral gastroenteritis. Will treat diarrhea with lomotil. Nausea and vomiting with zofran. Stool cultures pending and got labs today.(If symptoms worsen pending stool study results then consider antibiotic) Not indicated presently.

## 2013-12-28 NOTE — Progress Notes (Signed)
Pre visit review using our clinic review tool, if applicable. No additional management support is needed unless otherwise documented below in the visit note. 

## 2013-12-28 NOTE — Telephone Encounter (Signed)
Please advise patient that PA was started today and can take up to 3-7 business days to complete. JG//CMA

## 2013-12-28 NOTE — Progress Notes (Signed)
   Subjective:    Patient ID: Kristi Ramos, female    DOB: 28-Mar-1970, 43 y.o.   MRN: [id number]  HPI   Pt in sick since yesterday. She states just got some abdominal pain. Pain is around her umbilical area. Pain yesterday mid morning. Pt states pain present now. Has nausea. Pt has been vomiting a lot. Pt vomited about about 8 times yesterday. Loose stools/diarrhea yesterday. Water stools today. LMP- partial hysterectomy 5 yrs ago. No antibiotics recently. No preceding suspicious foods. No family or friends sick. Pt did eat out twice last week  Mild constant level pain. No back pain.  No urinary symptoms.      Review of Systems  Constitutional: Negative for fever, chills and fatigue.  HENT: Negative.   Respiratory: Negative for cough, chest tightness, shortness of breath and wheezing.   Cardiovascular: Negative for chest pain and palpitations.  Gastrointestinal: Positive for nausea, vomiting, abdominal pain and diarrhea. Negative for blood in stool, abdominal distention, anal bleeding and rectal pain.  Genitourinary: Negative.   Musculoskeletal: Negative.   Neurological: Negative.   Hematological: Negative for adenopathy. Does not bruise/bleed easily.  Psychiatric/Behavioral: Negative.        Objective:   Physical Exam  General Appearance- Not in acute distress.  HEENT Eyes- Scleraeral/Conjuntiva-bilat- Not Yellow. Mouth & Throat- Normal.  Chest and Lung Exam Auscultation: Breath sounds:-Normal. Adventitious sounds:- No Adventitious sounds.  Cardiovascular Auscultation:Rythm - Regular. Heart Sounds -Normal heart sounds.  Abdomen Inspection:-Inspection Normal.  Palpation/Perucssion: Palpation and Percussion of the abdomen reveal- only mild  Tender left of the umbilicus, No Rebound tenderness, No rigidity(Guarding) and No Palpable abdominal masses.  Liver:-Normal.  Spleen:- Normal.   Back- no cva tenderness.  Skin- moist.           Assessment & Plan:

## 2013-12-28 NOTE — Patient Instructions (Signed)
For your abdominal painm, nausea/vomiting and diarrhea, I prescribed prilosec, zofran and lomotil.  You likely have viral illness but I do want to go ahead and get stool studies(R/O other infectious causes). Please turn those in as soon as possible.  Please get labs today.  Follow up in 7 days or as needed.

## 2013-12-29 ENCOUNTER — Other Ambulatory Visit: Payer: Self-pay | Admitting: Medical

## 2013-12-29 LAB — COMPREHENSIVE METABOLIC PANEL
ALT: 34 U/L (ref 0–35)
AST: 25 U/L (ref 0–37)
Albumin: 3.5 g/dL (ref 3.5–5.2)
Alkaline Phosphatase: 83 U/L (ref 39–117)
BUN: 11 mg/dL (ref 6–23)
CHLORIDE: 104 meq/L (ref 96–112)
CO2: 29 mEq/L (ref 19–32)
Calcium: 8.4 mg/dL (ref 8.4–10.5)
Creatinine, Ser: 0.8 mg/dL (ref 0.4–1.2)
GFR: 82.96 mL/min (ref >60.00)
Glucose, Bld: 90 mg/dL (ref 70–99)
Potassium: 4 mEq/L (ref 3.5–5.1)
Sodium: 139 mEq/L (ref 135–145)
Total Bilirubin: 0.6 mg/dL (ref 0.2–1.2)
Total Protein: 7 g/dL (ref 6.0–8.3)

## 2013-12-29 LAB — CBC WITH DIFFERENTIAL/PLATELET
BASOS ABS: 0 10*3/uL (ref 0.0–0.1)
Basophils Relative: 0.5 % (ref 0.0–3.0)
EOS ABS: 0.1 10*3/uL (ref 0.0–0.7)
Eosinophils Relative: 1 % (ref 0.0–5.0)
HCT: 38.3 % (ref 36.0–46.0)
HEMOGLOBIN: 12.9 g/dL (ref 12.0–15.0)
LYMPHS PCT: 15.8 % (ref 12.0–46.0)
Lymphs Abs: 1.4 10*3/uL (ref 0.7–4.0)
MCHC: 33.6 g/dL (ref 30.0–36.0)
MCV: 88.3 fl (ref 78.0–100.0)
MONO ABS: 0.3 10*3/uL (ref 0.1–1.0)
Monocytes Relative: 3.9 % (ref 3.0–12.0)
NEUTROS ABS: 6.8 10*3/uL (ref 1.4–7.7)
NEUTROS PCT: 78.8 % — AB (ref 43.0–77.0)
Platelets: 324 10*3/uL (ref 150.0–400.0)
RBC: 4.33 Mil/uL (ref 3.87–5.11)
RDW: 13.6 % (ref 11.5–15.5)
WBC: 8.7 10*3/uL (ref 4.0–10.5)

## 2013-12-29 NOTE — Telephone Encounter (Signed)
PA for Contrave approved effective 12/28/2013 through 06/25/2014. Pharmacy notified patient. JG//CMA

## 2013-12-30 ENCOUNTER — Encounter: Payer: Self-pay | Admitting: Medical

## 2013-12-30 LAB — OVA AND PARASITE EXAMINATION: OP: NONE SEEN

## 2013-12-30 LAB — CLOSTRIDIUM DIFFICILE BY PCR: Toxigenic C. Difficile by PCR: NOT DETECTED

## 2014-01-02 LAB — STOOL CULTURE

## 2014-01-07 ENCOUNTER — Ambulatory Visit: Payer: BC Managed Care – PPO | Admitting: Medical

## 2014-01-07 NOTE — Telephone Encounter (Signed)
Did not understand her message?? She states she did not see panel and checked. If not back yet will you check with lab.

## 2014-02-15 ENCOUNTER — Telehealth: Payer: Self-pay

## 2014-02-15 DIAGNOSIS — I10 Essential (primary) hypertension: Secondary | ICD-10-CM

## 2014-02-15 MED ORDER — LISINOPRIL-HYDROCHLOROTHIAZIDE 10-12.5 MG PO TABS: 1.0000 | ORAL_TABLET | Freq: Every day | ORAL | Status: DC

## 2014-02-15 NOTE — Telephone Encounter (Signed)
[facility]   [facility] called about a refill on Kristi Ramos's lisinopril-hydrochlorothiazide (PRINZIDE,ZESTORETIC) 10-12.5 MG per tablet, he tried to fax in and their fax is down.

## 2014-02-15 NOTE — Telephone Encounter (Signed)
Rx called into [facility] at [id number] with 5 refills.       KP

## 2014-03-09 ENCOUNTER — Other Ambulatory Visit: Payer: Self-pay | Admitting: Family Medicine

## 2014-03-10 NOTE — Telephone Encounter (Signed)
Patient requesting Trazodone.   Patient last seen on 12/24/13.  Rx refilled 11/20/13 for 30 and 3 refills.  Please advise.      eal

## 2014-03-26 ENCOUNTER — Ambulatory Visit (INDEPENDENT_AMBULATORY_CARE_PROVIDER_SITE_OTHER): Payer: 59 | Admitting: Family Medicine

## 2014-03-26 ENCOUNTER — Encounter: Payer: Self-pay | Admitting: Family Medicine

## 2014-03-26 DIAGNOSIS — K219 Gastro-esophageal reflux disease without esophagitis: Secondary | ICD-10-CM

## 2014-03-26 DIAGNOSIS — G4700 Insomnia, unspecified: Secondary | ICD-10-CM

## 2014-03-26 DIAGNOSIS — I10 Essential (primary) hypertension: Secondary | ICD-10-CM

## 2014-03-26 MED ORDER — TRAZODONE HCL 50 MG PO TABS: ORAL_TABLET | ORAL | Status: DC

## 2014-03-26 MED ORDER — NALTREXONE-BUPROPION HCL ER 8-90 MG PO TB12: ORAL_TABLET | ORAL | Status: DC

## 2014-03-26 MED ORDER — OMEPRAZOLE 20 MG PO CPDR: 20.0000 mg | DELAYED_RELEASE_CAPSULE | Freq: Every day | ORAL | Status: DC

## 2014-03-26 MED ORDER — LISINOPRIL-HYDROCHLOROTHIAZIDE 10-12.5 MG PO TABS: 1.0000 | ORAL_TABLET | Freq: Every day | ORAL | Status: DC

## 2014-03-26 NOTE — Progress Notes (Signed)
Pre visit review using our clinic review tool, if applicable. No additional management support is needed unless otherwise documented below in the visit note. 

## 2014-03-26 NOTE — Progress Notes (Signed)
Subjective:    Patient ID: Kristi Ramos, female    DOB: Sep 17, 1970, 44 y.o.   MRN: [id number]  HPI  Patient here for weight check and f/u bp .  She is c/o of pain in r breast -- feels like a bruise.  No trauma noted.  She had her mammogram in October and it was normal.     Past Medical History  Diagnosis Date  . Uterine cancer 2010  . Cervical cancer 2010  . Chicken pox   . Depression 2009  . GERD (gastroesophageal reflux disease)   . Seasonal allergies   . HTN (hypertension)     Review of Systems  Constitutional: Negative for fever, chills, diaphoresis, activity change, appetite change, fatigue and unexpected weight change.  Respiratory: Negative for cough and shortness of breath.   Cardiovascular: Negative for chest pain, palpitations and leg swelling.  Genitourinary: Negative for dysuria, urgency, frequency, hematuria, flank pain, decreased urine volume, vaginal bleeding, vaginal discharge, enuresis, difficulty urinating, vaginal pain and dyspareunia.  Psychiatric/Behavioral: Negative for behavioral problems and dysphoric mood. The patient is not nervous/anxious.        Objective:    Physical Exam  Constitutional: She is oriented to person, place, and time. She appears well-developed and well-nourished. No distress.  HENT:  Right Ear: External ear normal.  Left Ear: External ear normal.  Nose: Nose normal.  Mouth/Throat: Oropharynx is clear and moist.  Eyes: EOM are normal. Pupils are equal, round, and reactive to light.  Neck: Normal range of motion. Neck supple.  Cardiovascular: Normal rate, regular rhythm and normal heart sounds.   No murmur heard. Pulmonary/Chest: Effort normal and breath sounds normal. No respiratory distress. She has no wheezes. She has no rales. She exhibits no tenderness.  Genitourinary: No breast swelling, tenderness, discharge or bleeding.  Breast tenderness at 6 o'clock R breast No errythema, no mass palpated No nipple discharge     Neurological: She is alert and oriented to person, place, and time.  Psychiatric: She has a normal mood and affect. Her behavior is normal. Judgment and thought content normal.  Nursing note and vitals reviewed.   BP 122/84 mmHg  Pulse 88  Temp(Src) 98.5 F (36.9 C) (Oral)  Wt 224 lb 6.4 oz (101.787 kg)  SpO2 95% Wt Readings from Last 3 Encounters:  03/26/14 224 lb 6.4 oz (101.787 kg)  12/28/13 237 lb 9.6 oz (107.775 kg)  12/24/13 239 lb (108.41 kg)     Lab Results  Component Value Date   WBC 8.7 12/28/2013   HGB 12.9 12/28/2013   HCT 38.3 12/28/2013   PLT 324.0 12/28/2013   GLUCOSE 90 12/28/2013   CHOL 172 11/20/2013   TRIG 75.0 11/20/2013   HDL 56.40 11/20/2013   LDLCALC 101* 11/20/2013   ALT 34 12/28/2013   AST 25 12/28/2013   NA 139 12/28/2013   K 4.0 12/28/2013   CL 104 12/28/2013   CREATININE 0.8 12/28/2013   BUN 11 12/28/2013   CO2 29 12/28/2013   TSH 0.96 11/20/2013   HGBA1C 5.8 12/24/2013    Mm Digital Screening Bilateral  11/23/2013   CLINICAL DATA:  Screening.  EXAM: DIGITAL SCREENING BILATERAL MAMMOGRAM WITH CAD  COMPARISON:  Previous exam(s).  ACR Breast Density Category b: There are scattered areas of fibroglandular density.  FINDINGS: There are no findings suspicious for malignancy. Images were processed with CAD.  IMPRESSION: No mammographic evidence of malignancy. A result letter of this screening mammogram will be mailed directly to the patient.  RECOMMENDATION: Screening mammogram in one year. (Code:SM-B-01Y)  BI-RADS CATEGORY  1: Negative.   Electronically Signed   By: Conchita Paris M.D.   On: 11/23/2013 15:08       Assessment & Plan:   Problem List Items Addressed This Visit    Morbid obesity - Primary    con't diet and exercise      Relevant Medications   Naltrexone-Bupropion HCl ER (CONTRAVE) 8-90 MG TB12   Insomnia   Relevant Medications   traZODone (DESYREL) tablet    Other Visit Diagnoses    Essential hypertension         Relevant Medications    lisinopril-hydrochlorothiazide (PRINZIDE,ZESTORETIC) 10-12.5 MG per tablet    Gastroesophageal reflux disease without esophagitis        Relevant Medications    omeprazole (PRILOSEC) capsule        Garnet Koyanagi, DO

## 2014-03-26 NOTE — Patient Instructions (Signed)

## 2014-03-27 DIAGNOSIS — G4700 Insomnia, unspecified: Secondary | ICD-10-CM | POA: Insufficient documentation

## 2014-03-27 NOTE — Assessment & Plan Note (Signed)
con't diet and exercise  

## 2014-04-20 ENCOUNTER — Other Ambulatory Visit: Payer: Self-pay | Admitting: Family Medicine

## 2014-06-04 ENCOUNTER — Encounter: Payer: Self-pay | Admitting: Family Medicine

## 2014-06-04 ENCOUNTER — Ambulatory Visit (INDEPENDENT_AMBULATORY_CARE_PROVIDER_SITE_OTHER): Payer: 59 | Admitting: Family Medicine

## 2014-06-04 VITALS — BP 130/82 | HR 82 | Temp 98.3°F

## 2014-06-04 DIAGNOSIS — I10 Essential (primary) hypertension: Secondary | ICD-10-CM | POA: Diagnosis not present

## 2014-06-04 DIAGNOSIS — G4700 Insomnia, unspecified: Secondary | ICD-10-CM

## 2014-06-04 DIAGNOSIS — L237 Allergic contact dermatitis due to plants, except food: Secondary | ICD-10-CM | POA: Diagnosis not present

## 2014-06-04 MED ORDER — TRAZODONE HCL 50 MG PO TABS: ORAL_TABLET | ORAL | Status: DC

## 2014-06-04 MED ORDER — PREDNISONE 10 MG PO TABS: ORAL_TABLET | ORAL | Status: DC

## 2014-06-04 MED ORDER — METHYLPREDNISOLONE ACETATE 80 MG/ML IJ SUSP
80.0000 mg | Freq: Once | INTRAMUSCULAR | Status: AC
  Administered 2014-06-04: 80 mg via INTRAMUSCULAR

## 2014-06-04 MED ORDER — LISINOPRIL-HYDROCHLOROTHIAZIDE 10-12.5 MG PO TABS: 1.0000 | ORAL_TABLET | Freq: Every day | ORAL | Status: DC

## 2014-06-04 NOTE — Patient Instructions (Signed)

## 2014-06-04 NOTE — Progress Notes (Signed)
Pre visit review using our clinic review tool, if applicable. No additional management support is needed unless otherwise documented below in the visit note. 

## 2014-06-04 NOTE — Progress Notes (Signed)
Subjective:    Patient ID: Kristi Ramos, female    DOB: [birth year], 44 y.o.   MRN: [id number]  HPI  Patient here c/o poison sumac/ ivy.    Past Medical History  Diagnosis Date  . Uterine cancer 2010  . Cervical cancer 2010  . Chicken pox   . Depression 2009  . GERD (gastroesophageal reflux disease)   . Seasonal allergies   . HTN (hypertension)     Review of Systems  Constitutional: Negative for activity change, appetite change, fatigue and unexpected weight change.  Respiratory: Negative for cough and shortness of breath.   Cardiovascular: Negative for chest pain and palpitations.  Skin: Positive for rash. Negative for color change.  Psychiatric/Behavioral: Negative for behavioral problems and dysphoric mood. The patient is not nervous/anxious.     Current Outpatient Prescriptions on File Prior to Visit  Medication Sig Dispense Refill  . Naltrexone-Bupropion HCl ER (CONTRAVE) 8-90 MG TB12 2 po bid 120 tablet 2  . omeprazole (PRILOSEC) 20 MG capsule Take 1 capsule (20 mg total) by mouth daily. 30 capsule 11   No current facility-administered medications on file prior to visit.       Objective:    Physical Exam  Constitutional: She is oriented to person, place, and time. She appears well-developed and well-nourished. No distress.  HENT:  Right Ear: External ear normal.  Left Ear: External ear normal.  Nose: Nose normal.  Mouth/Throat: Oropharynx is clear and moist.  Eyes: EOM are normal. Pupils are equal, round, and reactive to light.  Neck: Normal range of motion. Neck supple.  Cardiovascular: Normal rate, regular rhythm and normal heart sounds.   No murmur heard. Pulmonary/Chest: Effort normal and breath sounds normal. No respiratory distress. She has no wheezes. She has no rales. She exhibits no tenderness.  Neurological: She is alert and oriented to person, place, and time.  Skin:     Psychiatric: She has a normal mood and affect. Her behavior is normal.  Judgment and thought content normal.    BP 130/82 mmHg  Pulse 82  Temp(Src) 98.3 F (36.8 C) (Oral)  Wt   SpO2 97% Wt Readings from Last 3 Encounters:  03/26/14 224 lb 6.4 oz (101.787 kg)  12/28/13 237 lb 9.6 oz (107.775 kg)  12/24/13 239 lb (108.41 kg)     Lab Results  Component Value Date   WBC 8.7 12/28/2013   HGB 12.9 12/28/2013   HCT 38.3 12/28/2013   PLT 324.0 12/28/2013   GLUCOSE 90 12/28/2013   CHOL 172 11/20/2013   TRIG 75.0 11/20/2013   HDL 56.40 11/20/2013   LDLCALC 101* 11/20/2013   ALT 34 12/28/2013   AST 25 12/28/2013   NA 139 12/28/2013   K 4.0 12/28/2013   CL 104 12/28/2013   CREATININE 0.8 12/28/2013   BUN 11 12/28/2013   CO2 29 12/28/2013   TSH 0.96 11/20/2013   HGBA1C 5.8 12/24/2013       Assessment & Plan:   Problem List Items Addressed This Visit    Insomnia   Relevant Medications   traZODone (DESYREL) tablet    Other Visit Diagnoses    Essential hypertension    -  Primary    Relevant Medications    lisinopril-hydrochlorothiazide (PRINZIDE,ZESTORETIC) 10-12.5 MG per tablet    Poison sumac        Relevant Medications    methylPREDNISolone acetate (DEPO-MEDROL) injection 80 mg (Completed) (Start on 06/04/2014  1:45 PM)    predniSONE (DELTASONE) tablet  I am having Ms. Kristi Ramos start on predniSONE. I am also having her maintain her Naltrexone-Bupropion HCl ER, omeprazole, lisinopril-hydrochlorothiazide, and traZODone. We administered methylPREDNISolone acetate.  Meds ordered this encounter  Medications  . lisinopril-hydrochlorothiazide (PRINZIDE,ZESTORETIC) 10-12.5 MG per tablet    Sig: Take 1 tablet by mouth daily.    Dispense:  30 tablet    Refill:  5    D/C PREVIOUS SCRIPTS FOR THIS MEDICATION  . traZODone (DESYREL) 50 MG tablet    Sig: 1-2 po qhs prn for sleep    Dispense:  60 tablet    Refill:  1  . methylPREDNISolone acetate (DEPO-MEDROL) injection 80 mg    Sig:   . predniSONE (DELTASONE) 10 MG tablet    Sig: 3 po  qd for 3 days then 2 po qd for 3 days the 1 po qd for 3 days    Dispense:  18 tablet    Refill:  0     Garnet Koyanagi, DO

## 2014-06-10 ENCOUNTER — Other Ambulatory Visit: Payer: Self-pay | Admitting: Family Medicine

## 2014-08-17 ENCOUNTER — Other Ambulatory Visit: Payer: Self-pay | Admitting: Family Medicine

## 2014-08-18 NOTE — Telephone Encounter (Signed)
Last seen 06/04/14 and filled 03/26/14 #120 with 2 refills.   Please advise    KP

## 2014-08-31 ENCOUNTER — Other Ambulatory Visit: Payer: Self-pay | Admitting: Family Medicine

## 2014-08-31 NOTE — Telephone Encounter (Signed)
Okay #60 and one refill 

## 2014-08-31 NOTE — Telephone Encounter (Signed)
Pt is requesting refill on Trazodone. Dr. Etter Sjogren Pt.   Last OV: 06/04/2014 Last Fill: 06/04/2014 #60 1RF   Please advise.

## 2014-09-24 ENCOUNTER — Encounter: Payer: Self-pay | Admitting: Family Medicine

## 2014-09-24 ENCOUNTER — Ambulatory Visit (INDEPENDENT_AMBULATORY_CARE_PROVIDER_SITE_OTHER): Payer: 59 | Admitting: Family Medicine

## 2014-09-24 DIAGNOSIS — D2211 Melanocytic nevi of right eyelid, including canthus: Secondary | ICD-10-CM

## 2014-09-24 DIAGNOSIS — G4700 Insomnia, unspecified: Secondary | ICD-10-CM

## 2014-09-24 DIAGNOSIS — R0683 Snoring: Secondary | ICD-10-CM

## 2014-09-24 DIAGNOSIS — D2210 Melanocytic nevi of unspecified eyelid, including canthus: Secondary | ICD-10-CM

## 2014-09-24 MED ORDER — SUVOREXANT 20 MG PO TABS: 1.0000 | ORAL_TABLET | Freq: Every evening | ORAL | Status: DC | PRN

## 2014-09-24 MED ORDER — NALTREXONE-BUPROPION HCL ER 8-90 MG PO TB12: 2.0000 | ORAL_TABLET | Freq: Two times a day (BID) | ORAL | Status: DC

## 2014-09-24 NOTE — Patient Instructions (Signed)

## 2014-09-24 NOTE — Progress Notes (Signed)
Patient ID: Kristi Ramos, female    DOB: [birth year]  Age: 44 y.o. MRN: [id number]    Subjective:  Subjective HPI Kristi Ramos presents for f/u bp , anxiety/depression , weight and c/o mole on eye lid.  She feels the wellbutrin in the contrave is reallly helping.  She is not sleeping well because of all the stress with her son and is waking up to go to [place] and is snoring a lot.    Review of Systems  Constitutional: Negative for fever, chills, activity change, appetite change, fatigue and unexpected weight change.  HENT: Negative for congestion, postnasal drip, rhinorrhea and sinus pressure.   Respiratory: Negative for cough, chest tightness, shortness of breath and wheezing.   Cardiovascular: Negative for chest pain, palpitations and leg swelling.  Gastrointestinal: Negative for abdominal pain and abdominal distention.  Endocrine: Negative for polydipsia, polyphagia and polyuria.  Genitourinary: Negative for hematuria, flank pain, difficulty urinating, genital sores, vaginal pain, menstrual problem, pelvic pain and dyspareunia.  Musculoskeletal: Negative for back pain.  Allergic/Immunologic: Negative for environmental allergies.  Psychiatric/Behavioral: Negative for behavioral problems and dysphoric mood. The patient is not nervous/anxious.     History Past Medical History  Diagnosis Date  . Uterine cancer 2010  . Cervical cancer 2010  . Chicken pox   . Depression 2009  . GERD (gastroesophageal reflux disease)   . Seasonal allergies   . HTN (hypertension)     She has past surgical history that includes Cholecystectomy; Ankle surgery (Right); Leg Surgery; and Abdominal hysterectomy (2010).   Her family history includes Alcohol abuse in her maternal grandfather and paternal grandmother; Arthritis in her father and mother; Depression in her son; Drug abuse in her son; Heart attack in her paternal grandfather; Heart disease in her paternal grandfather; Hyperlipidemia in her father;  Hypertension in her brother and mother; Stroke in her mother.She reports that she has never smoked. She has never used smokeless tobacco. She reports that she drinks alcohol. She reports that she does not use illicit drugs.  Current Outpatient Prescriptions on File Prior to Visit  Medication Sig Dispense Refill  . lisinopril-hydrochlorothiazide (PRINZIDE,ZESTORETIC) 10-12.5 MG per tablet Take 1 tablet by mouth daily. 30 tablet 5  . omeprazole (PRILOSEC) 20 MG capsule Take 1 capsule (20 mg total) by mouth daily. 30 capsule 11   No current facility-administered medications on file prior to visit.     Objective:  Objective Physical Exam  Constitutional: She is oriented to person, place, and time. She appears well-developed and well-nourished.  HENT:  Head: Normocephalic and atraumatic.  Eyes: Conjunctivae and EOM are normal.  Neck: Normal range of motion. Neck supple. No JVD present. Carotid bruit is not present. No thyromegaly present.  Cardiovascular: Normal rate, regular rhythm and normal heart sounds.   No murmur heard. Pulmonary/Chest: Effort normal and breath sounds normal. No respiratory distress. She has no wheezes. She has no rales. She exhibits no tenderness.  Musculoskeletal: She exhibits no edema.  Neurological: She is alert and oriented to person, place, and time.  Psychiatric: She has a normal mood and affect. Her behavior is normal.   BP 120/80 mmHg  Pulse 70  Temp(Src) 99 F (37.2 C) (Oral)  Ht 5\' 3"  (1.6 m)  Wt 234 lb (106.142 kg)  BMI 41.46 kg/m2  SpO2 98% Wt Readings from Last 3 Encounters:  09/24/14 234 lb (106.142 kg)  03/26/14 224 lb 6.4 oz (101.787 kg)  12/28/13 237 lb 9.6 oz (107.775 kg)     Lab Results  Component Value Date   WBC 8.7 12/28/2013   HGB 12.9 12/28/2013   HCT 38.3 12/28/2013   PLT 324.0 12/28/2013   GLUCOSE 90 12/28/2013   CHOL 172 11/20/2013   TRIG 75.0 11/20/2013   HDL 56.40 11/20/2013   LDLCALC 101* 11/20/2013   ALT 34  12/28/2013   AST 25 12/28/2013   NA 139 12/28/2013   K 4.0 12/28/2013   CL 104 12/28/2013   CREATININE 0.8 12/28/2013   BUN 11 12/28/2013   CO2 29 12/28/2013   TSH 0.96 11/20/2013   HGBA1C 5.8 12/24/2013    Mm Digital Screening Bilateral  11/23/2013   CLINICAL DATA:  Screening.  EXAM: DIGITAL SCREENING BILATERAL MAMMOGRAM WITH CAD  COMPARISON:  Previous exam(s).  ACR Breast Density Category b: There are scattered areas of fibroglandular density.  FINDINGS: There are no findings suspicious for malignancy. Images were processed with CAD.  IMPRESSION: No mammographic evidence of malignancy. A result letter of this screening mammogram will be mailed directly to the patient.  RECOMMENDATION: Screening mammogram in one year. (Code:SM-B-01Y)  BI-RADS CATEGORY  1: Negative.   Electronically Signed   By: Conchita Paris M.D.   On: 11/23/2013 15:08     Assessment & Plan:  Plan I have discontinued Kristi Ramos's predniSONE and traZODone. I have also changed her CONTRAVE to Naltrexone-Bupropion HCl ER. Additionally, I am having her start on Suvorexant and Suvorexant. Lastly, I am having her maintain her omeprazole and lisinopril-hydrochlorothiazide.  Meds ordered this encounter  Medications  . Naltrexone-Bupropion HCl ER (CONTRAVE) 8-90 MG TB12    Sig: Take 2 tablets by mouth 2 (two) times daily.    Dispense:  120 tablet    Refill:  5  . Suvorexant (BELSOMRA) 20 MG TABS    Sig: Take 1 tablet by mouth at bedtime as needed.    Dispense:  30 tablet    Refill:  5  . Suvorexant (BELSOMRA) 20 MG TABS    Sig: Take 1 tablet by mouth at bedtime as needed.    Dispense:  10 tablet    Refill:  0    Problem List Items Addressed This Visit    Morbid obesity - Primary   Relevant Medications   Naltrexone-Bupropion HCl ER (CONTRAVE) 8-90 MG TB12   Insomnia   Relevant Medications   Suvorexant (BELSOMRA) 20 MG TABS   Suvorexant (BELSOMRA) 20 MG TABS   Other Relevant Orders   Ambulatory referral to  Pulmonology    Other Visit Diagnoses    Snoring        Relevant Orders    Ambulatory referral to Pulmonology    Nevus of eyelid, right        Relevant Orders    Ambulatory referral to Ophthalmology       Follow-up: Return in about 6 months (around 03/27/2015), or if symptoms worsen or fail to improve, for annual exam, fasting.  Garnet Koyanagi, DO

## 2014-09-24 NOTE — Progress Notes (Signed)
Pre visit review using our clinic review tool, if applicable. No additional management support is needed unless otherwise documented below in the visit note. 

## 2014-11-03 ENCOUNTER — Telehealth: Payer: Self-pay | Admitting: Family Medicine

## 2014-11-03 ENCOUNTER — Other Ambulatory Visit: Payer: Self-pay | Admitting: Internal Medicine

## 2014-11-03 NOTE — Telephone Encounter (Signed)
traZODone (DESYREL) 50 MG tablet [Pharmacy Med Name: TRAZODONE 50MG  TABLETS]  Pt states that she thinks the Contrave was disrupting her sleeping because she was taking it too late. She is taking it as soon as she gets home from work now and she is sleeping better. 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 She has 2 nights of trazodone.

## 2014-11-04 MED ORDER — TRAZODONE HCL 50 MG PO TABS: 50.0000 mg | ORAL_TABLET | Freq: Every evening | ORAL | Status: DC | PRN

## 2014-11-04 NOTE — Telephone Encounter (Signed)
Had we given her the coupon for belsomra? We can refill trazadone x1

## 2014-11-04 NOTE — Telephone Encounter (Signed)
Last seen 09/24/14. Please advise    KP

## 2014-11-04 NOTE — Telephone Encounter (Signed)
Trazodone has been sent to the pharmacy, and a message for the patient to call the office.      KP

## 2014-11-05 ENCOUNTER — Other Ambulatory Visit: Payer: Self-pay | Admitting: Family Medicine

## 2014-11-05 DIAGNOSIS — Z1231 Encounter for screening mammogram for malignant neoplasm of breast: Secondary | ICD-10-CM

## 2014-11-22 ENCOUNTER — Telehealth: Payer: Self-pay | Admitting: *Deleted

## 2014-11-22 NOTE — Telephone Encounter (Signed)
PA for Contrave initiated with [facility]. Awaiting determination. JG//CMA

## 2014-11-24 NOTE — Telephone Encounter (Signed)
Let pt know--- she can call her ins co and find out if they will pay for anything

## 2014-11-24 NOTE — Telephone Encounter (Signed)
Contrave is an exclusion on pt's plan and insurance will not cover. Please advise. JG//CMA

## 2014-11-26 ENCOUNTER — Inpatient Hospital Stay (HOSPITAL_BASED_OUTPATIENT_CLINIC_OR_DEPARTMENT_OTHER): Admission: RE | Admit: 2014-11-26 | Payer: 59 | Source: Ambulatory Visit

## 2014-12-09 ENCOUNTER — Ambulatory Visit (HOSPITAL_BASED_OUTPATIENT_CLINIC_OR_DEPARTMENT_OTHER): Payer: 59

## 2014-12-14 ENCOUNTER — Other Ambulatory Visit: Payer: Self-pay | Admitting: Family Medicine

## 2014-12-16 ENCOUNTER — Ambulatory Visit (HOSPITAL_BASED_OUTPATIENT_CLINIC_OR_DEPARTMENT_OTHER)
Admission: RE | Admit: 2014-12-16 | Discharge: 2014-12-16 | Disposition: A | Discharge status: Home / Self Care | Payer: PRIVATE HEALTH INSURANCE | Source: Ambulatory Visit | Attending: Family Medicine | Admitting: Family Medicine

## 2014-12-16 ENCOUNTER — Other Ambulatory Visit: Payer: Self-pay | Admitting: Family Medicine

## 2014-12-16 ENCOUNTER — Telehealth: Payer: Self-pay | Admitting: Family Medicine

## 2014-12-16 DIAGNOSIS — R928 Other abnormal and inconclusive findings on diagnostic imaging of breast: Secondary | ICD-10-CM | POA: Insufficient documentation

## 2014-12-16 DIAGNOSIS — K219 Gastro-esophageal reflux disease without esophagitis: Secondary | ICD-10-CM

## 2014-12-16 DIAGNOSIS — Z1231 Encounter for screening mammogram for malignant neoplasm of breast: Secondary | ICD-10-CM | POA: Diagnosis not present

## 2014-12-16 MED ORDER — OMEPRAZOLE 20 MG PO CPDR: 20.0000 mg | DELAYED_RELEASE_CAPSULE | Freq: Every day | ORAL | Status: DC

## 2014-12-16 MED ORDER — LISINOPRIL-HYDROCHLOROTHIAZIDE 10-12.5 MG PO TABS: 1.0000 | ORAL_TABLET | Freq: Every day | ORAL | Status: DC

## 2014-12-16 NOTE — Telephone Encounter (Signed)
Caller name: Vikki  Relation to pt: self Call back number: [phone]  Pharmacy: [facility] [address], [facility]   Reason for call: Pt is requesting med refill on LISINOPRIL-10/12.5 mg Tablets and Omeprazole 20 mg. Please advise.

## 2014-12-16 NOTE — Telephone Encounter (Signed)
Rx faxed.    KP 

## 2014-12-17 ENCOUNTER — Other Ambulatory Visit: Payer: Self-pay | Admitting: Family Medicine

## 2014-12-17 NOTE — Telephone Encounter (Signed)
Last seen 09/24/14 and filled 11/04/14#60 with 1 refill   Please advise    KP

## 2014-12-21 ENCOUNTER — Other Ambulatory Visit: Payer: Self-pay | Admitting: Family Medicine

## 2014-12-21 DIAGNOSIS — R928 Other abnormal and inconclusive findings on diagnostic imaging of breast: Secondary | ICD-10-CM

## 2014-12-23 ENCOUNTER — Ambulatory Visit
Admission: RE | Admit: 2014-12-23 | Discharge: 2014-12-23 | Disposition: A | Discharge status: Home / Self Care | Payer: PRIVATE HEALTH INSURANCE | Source: Ambulatory Visit | Attending: Family Medicine | Admitting: Family Medicine

## 2014-12-23 ENCOUNTER — Ambulatory Visit: Payer: PRIVATE HEALTH INSURANCE | Admitting: Family Medicine

## 2014-12-23 DIAGNOSIS — R928 Other abnormal and inconclusive findings on diagnostic imaging of breast: Secondary | ICD-10-CM

## 2015-01-07 ENCOUNTER — Other Ambulatory Visit (HOSPITAL_COMMUNITY)
Admission: RE | Admit: 2015-01-07 | Discharge: 2015-01-07 | Disposition: A | Discharge status: Home / Self Care | Payer: PRIVATE HEALTH INSURANCE | Source: Ambulatory Visit | Attending: Family Medicine | Admitting: Family Medicine

## 2015-01-07 ENCOUNTER — Ambulatory Visit (INDEPENDENT_AMBULATORY_CARE_PROVIDER_SITE_OTHER): Payer: PRIVATE HEALTH INSURANCE | Admitting: Family Medicine

## 2015-01-07 ENCOUNTER — Encounter: Payer: Self-pay | Admitting: Family Medicine

## 2015-01-07 ENCOUNTER — Ambulatory Visit (INDEPENDENT_AMBULATORY_CARE_PROVIDER_SITE_OTHER): Payer: PRIVATE HEALTH INSURANCE | Admitting: Psychology

## 2015-01-07 VITALS — BP 130/80 | HR 93 | Temp 98.3°F | Ht 63.0 in | Wt 237.4 lb

## 2015-01-07 DIAGNOSIS — F4323 Adjustment disorder with mixed anxiety and depressed mood: Secondary | ICD-10-CM | POA: Diagnosis not present

## 2015-01-07 DIAGNOSIS — Z124 Encounter for screening for malignant neoplasm of cervix: Secondary | ICD-10-CM

## 2015-01-07 DIAGNOSIS — Z01419 Encounter for gynecological examination (general) (routine) without abnormal findings: Secondary | ICD-10-CM | POA: Insufficient documentation

## 2015-01-07 DIAGNOSIS — G4700 Insomnia, unspecified: Secondary | ICD-10-CM

## 2015-01-07 DIAGNOSIS — Z0000 Encounter for general adult medical examination without abnormal findings: Secondary | ICD-10-CM | POA: Diagnosis not present

## 2015-01-07 DIAGNOSIS — I10 Essential (primary) hypertension: Secondary | ICD-10-CM

## 2015-01-07 MED ORDER — PHENTERMINE HCL 37.5 MG PO CAPS: 37.5000 mg | ORAL_CAPSULE | ORAL | Status: DC

## 2015-01-07 MED ORDER — TRAZODONE HCL 50 MG PO TABS: ORAL_TABLET | ORAL | Status: DC

## 2015-01-07 NOTE — Progress Notes (Signed)
Subjective:     Kristi Ramos is a 44 y.o. female and is here for a comprehensive physical exam. The patient reports no problems.  Social History   Social History  . Marital Status: Divorced    Spouse Name: N/A  . Number of Children: N/A  . Years of Education: N/A   Occupational History  .      dental hygiene   Social History Main Topics  . Smoking status: Never Smoker   . Smokeless tobacco: Never Used  . Alcohol Use: Yes     Comment: Occ  . Drug Use: No  . Sexual Activity:    Partners: Male   Other Topics Concern  . Not on file   Social History Narrative   Exercise--- just started back   Health Maintenance  Topic Date Due  . PAP SMEAR  12/28/2014  . HIV Screening  06/04/2015 (Originally [birth year]/1985)  . TETANUS/TDAP  06/24/2015  . INFLUENZA VACCINE  09/27/2015  . MAMMOGRAM  12/16/2015    The following portions of the patient's history were reviewed and updated as appropriate:  She  has a past medical history of Uterine cancer ([place]) (2010); Cervical cancer ([place]) (2010); Chicken pox; Depression (2009); GERD (gastroesophageal reflux disease); Seasonal allergies; and HTN (hypertension). She  does not have any pertinent problems on file. She  has past surgical history that includes Cholecystectomy; Ankle surgery (Right); Leg Surgery; and Abdominal hysterectomy (2010). Her family history includes Alcohol abuse in her maternal grandfather and paternal grandmother; Arthritis in her father and mother; Depression in her son; Drug abuse in her son; Heart attack in her paternal grandfather; Heart attack (age of onset: 77) in her father; Heart disease in her father and paternal grandfather; Hyperlipidemia in her father; Hypertension in her brother and mother; Stroke in her mother; Thyroid disease in her mother. She  reports that she has never smoked. She has never used smokeless tobacco. She reports that she drinks alcohol. She reports that she does not use illicit drugs. She  has a current medication list which includes the following prescription(s): lisinopril-hydrochlorothiazide, omeprazole, and trazodone. Current Outpatient Prescriptions on File Prior to Visit  Medication Sig Dispense Refill  . lisinopril-hydrochlorothiazide (PRINZIDE,ZESTORETIC) 10-12.5 MG tablet Take 1 tablet by mouth daily. 30 tablet 5  . omeprazole (PRILOSEC) 20 MG capsule Take 1 capsule (20 mg total) by mouth daily. 30 capsule 11  . traZODone (DESYREL) 50 MG tablet TAKE 1 TO 2 TABLETS(50 TO 100 MG) BY MOUTH AT BEDTIME AS NEEDED FOR SLEEP 60 tablet 0   No current facility-administered medications on file prior to visit.   She is allergic to moxifloxacin..  Review of Systems Review of Systems  Constitutional: Negative for activity change, appetite change and fatigue.  HENT: Negative for hearing loss, congestion, tinnitus and ear discharge.  dentist q56mEyes: Negative for visual disturbance (see optho q1y -- vision corrected to 20/20 with glasses).  Respiratory: Negative for cough, chest tightness and shortness of breath.   Cardiovascular: Negative for chest pain, palpitations and leg swelling.  Gastrointestinal: Negative for abdominal pain, diarrhea, constipation and abdominal distention.  Genitourinary: Negative for urgency, frequency, decreased urine volume and difficulty urinating.  Musculoskeletal: Negative for back pain, arthralgias and gait problem.  Skin: Negative for color change, pallor and rash.  Neurological: Negative for dizziness, light-headedness, numbness and headaches.  Hematological: Negative for adenopathy. Does not bruise/bleed easily.  Psychiatric/Behavioral: Negative for suicidal ideas, confusion, sleep disturbance, self-injury, dysphoric mood, decreased concentration and agitation.  Objective:    BP 130/80 mmHg  Pulse 93  Temp(Src) 98.3 F (36.8 C) (Oral)  Ht 5' 3" (1.6 m)  Wt 237 lb 6.4 oz (107.684 kg)  BMI 42.06 kg/m2  SpO2 98% General appearance:  alert, cooperative, appears stated age and no distress Head: Normocephalic, without obvious abnormality, atraumatic Eyes: conjunctivae/corneas clear. PERRL, EOM's intact. Fundi benign. Ears: normal TM's and external ear canals both ears Nose: Nares normal. Septum midline. Mucosa normal. No drainage or sinus tenderness. Throat: lips, mucosa, and tongue normal; teeth and gums normal Neck: no adenopathy, no carotid bruit, no JVD, supple, symmetrical, trachea midline and thyroid not enlarged, symmetric, no tenderness/mass/nodules Back: symmetric, no curvature. ROM normal. No CVA tenderness. Lungs: clear to auscultation bilaterally Breasts: normal appearance, no masses or tenderness Heart: regular rate and rhythm, S1, S2 normal, no murmur, click, rub or gallop Abdomen: soft, non-tender; bowel sounds normal; no masses,  no organomegaly Pelvic: deferred Extremities: extremities normal, atraumatic, no cyanosis or edema Pulses: 2+ and symmetric Skin: Skin color, texture, turgor normal. No rashes or lesions Lymph nodes: Cervical, supraclavicular, and axillary nodes normal. Neurologic: Alert and oriented X 3, normal strength and tone. Normal symmetric reflexes. Normal coordination and gait Psych- no depression, no anxiety      Assessment:    Healthy female exam.      Plan:    ghm utd Check labs See After Visit Summary for Counseling Recommendations    1. Essential hypertension con't lisinopril hct stable - Comp Met (CMET); Future - CBC with Differential/Platelet; Future - Lipid panel; Future - POCT urinalysis dipstick; Future - TSH; Future  2. Preventative health care   - Comp Met (CMET); Future - CBC with Differential/Platelet; Future - Lipid panel; Future - POCT urinalysis dipstick; Future - TSH; Future  3. Obesity, Class III, BMI 40-49.9 (morbid obesity) (HCC) Discussed diet and exercise - phentermine 37.5 MG capsule; Take 1 capsule (37.5 mg total) by mouth every morning.   Dispense: 30 capsule; Refill: 0  4. Insomnia   - traZODone (DESYREL) 50 MG tablet; TAKE 1 TO 2 TABLETS(50 TO 100 MG) BY MOUTH AT BEDTIME AS NEEDED FOR SLEEP  Dispense: 60 tablet; Refill: 0  5. Screening for malignant neoplasm of cervix   - Cytology - PAP  

## 2015-01-07 NOTE — Patient Instructions (Signed)
Preventive Care for Adults, Female A healthy lifestyle and preventive care can promote health and wellness. Preventive health guidelines for women include the following key practices.  A routine yearly physical is a good way to check with your health care provider about your health and preventive screening. It is a chance to share any concerns and updates on your health and to receive a thorough exam.  Visit your dentist for a routine exam and preventive care every 6 months. Brush your teeth twice a day and floss once a day. Good oral hygiene prevents tooth decay and gum disease.  The frequency of eye exams is based on your age, health, family medical history, use of contact lenses, and other factors. Follow your health care provider's recommendations for frequency of eye exams.  Eat a healthy diet. Foods like vegetables, fruits, whole grains, low-fat dairy products, and lean protein foods contain the nutrients you need without too many calories. Decrease your intake of foods high in solid fats, added sugars, and salt. Eat the right amount of calories for you.Get information about a proper diet from your health care provider, if necessary.  Regular physical exercise is one of the most important things you can do for your health. Most adults should get at least 150 minutes of moderate-intensity exercise (any activity that increases your heart rate and causes you to sweat) each week. In addition, most adults need muscle-strengthening exercises on 2 or more days a week.  Maintain a healthy weight. The body mass index (BMI) is a screening tool to identify possible weight problems. It provides an estimate of body fat based on height and weight. Your health care provider can find your BMI and can help you achieve or maintain a healthy weight.For adults 20 years and older:  A BMI below 18.5 is considered underweight.  A BMI of 18.5 to 24.9 is normal.  A BMI of 25 to 29.9 is considered overweight.  A  BMI of 30 and above is considered obese.  Maintain normal blood lipids and cholesterol levels by exercising and minimizing your intake of saturated fat. Eat a balanced diet with plenty of fruit and vegetables. Blood tests for lipids and cholesterol should begin at age 45 and be repeated every 5 years. If your lipid or cholesterol levels are high, you are over 50, or you are at high risk for heart disease, you may need your cholesterol levels checked more frequently.Ongoing high lipid and cholesterol levels should be treated with medicines if diet and exercise are not working.  If you smoke, find out from your health care provider how to quit. If you do not use tobacco, do not start.  Lung cancer screening is recommended for adults aged 45-80 years who are at high risk for developing lung cancer because of a history of smoking. A yearly low-dose CT scan of the lungs is recommended for people who have at least a 30-pack-year history of smoking and are a current smoker or have quit within the past 15 years. A pack year of smoking is smoking an average of 1 pack of cigarettes a day for 1 year (for example: 1 pack a day for 30 years or 2 packs a day for 15 years). Yearly screening should continue until the smoker has stopped smoking for at least 15 years. Yearly screening should be stopped for people who develop a health problem that would prevent them from having lung cancer treatment.  If you are pregnant, do not drink alcohol. If you are  breastfeeding, be very cautious about drinking alcohol. If you are not pregnant and choose to drink alcohol, do not have more than 1 drink per day. One drink is considered to be 12 ounces (355 mL) of beer, 5 ounces (148 mL) of wine, or 1.5 ounces (44 mL) of liquor.  Avoid use of street drugs. Do not share needles with anyone. Ask for help if you need support or instructions about stopping the use of drugs.  High blood pressure causes heart disease and increases the risk  of stroke. Your blood pressure should be checked at least every 1 to 2 years. Ongoing high blood pressure should be treated with medicines if weight loss and exercise do not work.  If you are 55-79 years old, ask your health care provider if you should take aspirin to prevent strokes.  Diabetes screening is done by taking a blood sample to check your blood glucose level after you have not eaten for a certain period of time (fasting). If you are not overweight and you do not have risk factors for diabetes, you should be screened once every 3 years starting at age 45. If you are overweight or obese and you are 40-70 years of age, you should be screened for diabetes every year as part of your cardiovascular risk assessment.  Breast cancer screening is essential preventive care for women. You should practice "breast self-awareness." This means understanding the normal appearance and feel of your breasts and may include breast self-examination. Any changes detected, no matter how small, should be reported to a health care provider. Women in their 20s and 30s should have a clinical breast exam (CBE) by a health care provider as part of a regular health exam every 1 to 3 years. After age 40, women should have a CBE every year. Starting at age 40, women should consider having a mammogram (breast X-ray test) every year. Women who have a family history of breast cancer should talk to their health care provider about genetic screening. Women at a high risk of breast cancer should talk to their health care providers about having an MRI and a mammogram every year.  Breast cancer gene (BRCA)-related cancer risk assessment is recommended for women who have family members with BRCA-related cancers. BRCA-related cancers include breast, ovarian, tubal, and peritoneal cancers. Having family members with these cancers may be associated with an increased risk for harmful changes (mutations) in the breast cancer genes BRCA1 and  BRCA2. Results of the assessment will determine the need for genetic counseling and BRCA1 and BRCA2 testing.  Your health care provider may recommend that you be screened regularly for cancer of the pelvic organs (ovaries, uterus, and vagina). This screening involves a pelvic examination, including checking for microscopic changes to the surface of your cervix (Pap test). You may be encouraged to have this screening done every 3 years, beginning at age 21.  For women ages 30-65, health care providers may recommend pelvic exams and Pap testing every 3 years, or they may recommend the Pap and pelvic exam, combined with testing for human papilloma virus (HPV), every 5 years. Some types of HPV increase your risk of cervical cancer. Testing for HPV may also be done on women of any age with unclear Pap test results.  Other health care providers may not recommend any screening for nonpregnant women who are considered low risk for pelvic cancer and who do not have symptoms. Ask your health care provider if a screening pelvic exam is right for   you.  If you have had past treatment for cervical cancer or a condition that could lead to cancer, you need Pap tests and screening for cancer for at least 20 years after your treatment. If Pap tests have been discontinued, your risk factors (such as having a new sexual partner) need to be reassessed to determine if screening should resume. Some women have medical problems that increase the chance of getting cervical cancer. In these cases, your health care provider may recommend more frequent screening and Pap tests.  Colorectal cancer can be detected and often prevented. Most routine colorectal cancer screening begins at the age of 50 years and continues through age 75 years. However, your health care provider may recommend screening at an earlier age if you have risk factors for colon cancer. On a yearly basis, your health care provider may provide home test kits to check  for hidden blood in the stool. Use of a small camera at the end of a tube, to directly examine the colon (sigmoidoscopy or colonoscopy), can detect the earliest forms of colorectal cancer. Talk to your health care provider about this at age 50, when routine screening begins. Direct exam of the colon should be repeated every 5-10 years through age 75 years, unless early forms of precancerous polyps or small growths are found.  People who are at an increased risk for hepatitis B should be screened for this virus. You are considered at high risk for hepatitis B if:  You were born in a country where hepatitis B occurs often. Talk with your health care provider about which countries are considered high risk.  Your parents were born in a high-risk country and you have not received a shot to protect against hepatitis B (hepatitis B vaccine).  You have HIV or AIDS.  You use needles to inject street drugs.  You live with, or have sex with, someone who has hepatitis B.  You get hemodialysis treatment.  You take certain medicines for conditions like cancer, organ transplantation, and autoimmune conditions.  Hepatitis C blood testing is recommended for all people born from 1945 through 1965 and any individual with known risks for hepatitis C.  Practice safe sex. Use condoms and avoid high-risk sexual practices to reduce the spread of sexually transmitted infections (STIs). STIs include gonorrhea, chlamydia, syphilis, trichomonas, herpes, HPV, and human immunodeficiency virus (HIV). Herpes, HIV, and HPV are viral illnesses that have no cure. They can result in disability, cancer, and death.  You should be screened for sexually transmitted illnesses (STIs) including gonorrhea and chlamydia if:  You are sexually active and are younger than 24 years.  You are older than 24 years and your health care provider tells you that you are at risk for this type of infection.  Your sexual activity has changed  since you were last screened and you are at an increased risk for chlamydia or gonorrhea. Ask your health care provider if you are at risk.  If you are at risk of being infected with HIV, it is recommended that you take a prescription medicine daily to prevent HIV infection. This is called preexposure prophylaxis (PrEP). You are considered at risk if:  You are sexually active and do not regularly use condoms or know the HIV status of your partner(s).  You take drugs by injection.  You are sexually active with a partner who has HIV.  Talk with your health care provider about whether you are at high risk of being infected with HIV. If   you choose to begin PrEP, you should first be tested for HIV. You should then be tested every 3 months for as long as you are taking PrEP.  Osteoporosis is a disease in which the bones lose minerals and strength with aging. This can result in serious bone fractures or breaks. The risk of osteoporosis can be identified using a bone density scan. Women ages 67 years and over and women at risk for fractures or osteoporosis should discuss screening with their health care providers. Ask your health care provider whether you should take a calcium supplement or vitamin D to reduce the rate of osteoporosis.  Menopause can be associated with physical symptoms and risks. Hormone replacement therapy is available to decrease symptoms and risks. You should talk to your health care provider about whether hormone replacement therapy is right for you.  Use sunscreen. Apply sunscreen liberally and repeatedly throughout the day. You should seek shade when your shadow is shorter than you. Protect yourself by wearing long sleeves, pants, a wide-brimmed hat, and sunglasses year round, whenever you are outdoors.  Once a month, do a whole body skin exam, using a mirror to look at the skin on your back. Tell your health care provider of new moles, moles that have irregular borders, moles that  are larger than a pencil eraser, or moles that have changed in shape or color.  Stay current with required vaccines (immunizations).  Influenza vaccine. All adults should be immunized every year.  Tetanus, diphtheria, and acellular pertussis (Td, Tdap) vaccine. Pregnant women should receive 1 dose of Tdap vaccine during each pregnancy. The dose should be obtained regardless of the length of time since the last dose. Immunization is preferred during the 27th-36th week of gestation. An adult who has not previously received Tdap or who does not know her vaccine status should receive 1 dose of Tdap. This initial dose should be followed by tetanus and diphtheria toxoids (Td) booster doses every 10 years. Adults with an unknown or incomplete history of completing a 3-dose immunization series with Td-containing vaccines should begin or complete a primary immunization series including a Tdap dose. Adults should receive a Td booster every 10 years.  Varicella vaccine. An adult without evidence of immunity to varicella should receive 2 doses or a second dose if she has previously received 1 dose. Pregnant females who do not have evidence of immunity should receive the first dose after pregnancy. This first dose should be obtained before leaving the health care facility. The second dose should be obtained 4-8 weeks after the first dose.  Human papillomavirus (HPV) vaccine. Females aged 13-26 years who have not received the vaccine previously should obtain the 3-dose series. The vaccine is not recommended for use in pregnant females. However, pregnancy testing is not needed before receiving a dose. If a female is found to be pregnant after receiving a dose, no treatment is needed. In that case, the remaining doses should be delayed until after the pregnancy. Immunization is recommended for any person with an immunocompromised condition through the age of 61 years if she did not get any or all doses earlier. During the  3-dose series, the second dose should be obtained 4-8 weeks after the first dose. The third dose should be obtained 24 weeks after the first dose and 16 weeks after the second dose.  Zoster vaccine. One dose is recommended for adults aged 30 years or older unless certain conditions are present.  Measles, mumps, and rubella (MMR) vaccine. Adults born  before 1957 generally are considered immune to measles and mumps. Adults born in 1957 or later should have 1 or more doses of MMR vaccine unless there is a contraindication to the vaccine or there is laboratory evidence of immunity to each of the three diseases. A routine second dose of MMR vaccine should be obtained at least 28 days after the first dose for students attending postsecondary schools, health care workers, or international travelers. People who received inactivated measles vaccine or an unknown type of measles vaccine during 1963-1967 should receive 2 doses of MMR vaccine. People who received inactivated mumps vaccine or an unknown type of mumps vaccine before 1979 and are at high risk for mumps infection should consider immunization with 2 doses of MMR vaccine. For females of childbearing age, rubella immunity should be determined. If there is no evidence of immunity, females who are not pregnant should be vaccinated. If there is no evidence of immunity, females who are pregnant should delay immunization until after pregnancy. Unvaccinated health care workers born before 1957 who lack laboratory evidence of measles, mumps, or rubella immunity or laboratory confirmation of disease should consider measles and mumps immunization with 2 doses of MMR vaccine or rubella immunization with 1 dose of MMR vaccine.  Pneumococcal 13-valent conjugate (PCV13) vaccine. When indicated, a person who is uncertain of his immunization history and has no record of immunization should receive the PCV13 vaccine. All adults 65 years of age and older should receive this  vaccine. An adult aged 19 years or older who has certain medical conditions and has not been previously immunized should receive 1 dose of PCV13 vaccine. This PCV13 should be followed with a dose of pneumococcal polysaccharide (PPSV23) vaccine. Adults who are at high risk for pneumococcal disease should obtain the PPSV23 vaccine at least 8 weeks after the dose of PCV13 vaccine. Adults older than 44 years of age who have normal immune system function should obtain the PPSV23 vaccine dose at least 1 year after the dose of PCV13 vaccine.  Pneumococcal polysaccharide (PPSV23) vaccine. When PCV13 is also indicated, PCV13 should be obtained first. All adults aged 65 years and older should be immunized. An adult younger than age 65 years who has certain medical conditions should be immunized. Any person who resides in a nursing home or long-term care facility should be immunized. An adult smoker should be immunized. People with an immunocompromised condition and certain other conditions should receive both PCV13 and PPSV23 vaccines. People with human immunodeficiency virus (HIV) infection should be immunized as soon as possible after diagnosis. Immunization during chemotherapy or radiation therapy should be avoided. Routine use of PPSV23 vaccine is not recommended for American Indians, [place] Natives, or people younger than 65 years unless there are medical conditions that require PPSV23 vaccine. When indicated, people who have unknown immunization and have no record of immunization should receive PPSV23 vaccine. One-time revaccination 5 years after the first dose of PPSV23 is recommended for people aged 19-64 years who have chronic kidney failure, nephrotic syndrome, asplenia, or immunocompromised conditions. People who received 1-2 doses of PPSV23 before age 65 years should receive another dose of PPSV23 vaccine at age 65 years or later if at least 5 years have passed since the previous dose. Doses of PPSV23 are not  needed for people immunized with PPSV23 at or after age 65 years.  Meningococcal vaccine. Adults with asplenia or persistent complement component deficiencies should receive 2 doses of quadrivalent meningococcal conjugate (MenACWY-D) vaccine. The doses should be obtained   at least 2 months apart. Microbiologists working with certain meningococcal bacteria, [place] recruits, people at risk during an outbreak, and people who travel to or live in countries with a high rate of meningitis should be immunized. A first-year college student up through age 34 years who is living in a residence hall should receive a dose if she did not receive a dose on or after her 16th birthday. Adults who have certain high-risk conditions should receive one or more doses of vaccine.  Hepatitis A vaccine. Adults who wish to be protected from this disease, have certain high-risk conditions, work with hepatitis A-infected animals, work in hepatitis A research labs, or travel to or work in countries with a high rate of hepatitis A should be immunized. Adults who were previously unvaccinated and who anticipate close contact with an international adoptee during the first 60 days after arrival in the [place] from a country with a high rate of hepatitis A should be immunized.  Hepatitis B vaccine. Adults who wish to be protected from this disease, have certain high-risk conditions, may be exposed to blood or other infectious body fluids, are household contacts or sex partners of hepatitis B positive people, are clients or workers in certain care facilities, or travel to or work in countries with a high rate of hepatitis B should be immunized.  Haemophilus influenzae type b (Hib) vaccine. A previously unvaccinated person with asplenia or sickle cell disease or having a scheduled splenectomy should receive 1 dose of Hib vaccine. Regardless of previous immunization, a recipient of a hematopoietic stem cell transplant should receive a  3-dose series 6-12 months after her successful transplant. Hib vaccine is not recommended for adults with HIV infection. Preventive Services / Frequency Ages 35 to 4 years  Blood pressure check.** / Every 3-5 years.  Lipid and cholesterol check.** / Every 5 years beginning at age 60.  Clinical breast exam.** / Every 3 years for women in their 71s and 10s.  BRCA-related cancer risk assessment.** / For women who have family members with a BRCA-related cancer (breast, ovarian, tubal, or peritoneal cancers).  Pap test.** / Every 2 years from ages 76 through 26. Every 3 years starting at age 61 through age 76 or 93 with a history of 3 consecutive normal Pap tests.  HPV screening.** / Every 3 years from ages 37 through ages 60 to 51 with a history of 3 consecutive normal Pap tests.  Hepatitis C blood test.** / For any individual with known risks for hepatitis C.  Skin self-exam. / Monthly.  Influenza vaccine. / Every year.  Tetanus, diphtheria, and acellular pertussis (Tdap, Td) vaccine.** / Consult your health care provider. Pregnant women should receive 1 dose of Tdap vaccine during each pregnancy. 1 dose of Td every 10 years.  Varicella vaccine.** / Consult your health care provider. Pregnant females who do not have evidence of immunity should receive the first dose after pregnancy.  HPV vaccine. / 3 doses over 6 months, if 93 and younger. The vaccine is not recommended for use in pregnant females. However, pregnancy testing is not needed before receiving a dose.  Measles, mumps, rubella (MMR) vaccine.** / You need at least 1 dose of MMR if you were born in 1957 or later. You may also need a 2nd dose. For females of childbearing age, rubella immunity should be determined. If there is no evidence of immunity, females who are not pregnant should be vaccinated. If there is no evidence of immunity, females who are  pregnant should delay immunization until after pregnancy.  Pneumococcal  13-valent conjugate (PCV13) vaccine.** / Consult your health care provider.  Pneumococcal polysaccharide (PPSV23) vaccine.** / 1 to 2 doses if you smoke cigarettes or if you have certain conditions.  Meningococcal vaccine.** / 1 dose if you are age 68 to 8 years and a Market researcher living in a residence hall, or have one of several medical conditions, you need to get vaccinated against meningococcal disease. You may also need additional booster doses.  Hepatitis A vaccine.** / Consult your health care provider.  Hepatitis B vaccine.** / Consult your health care provider.  Haemophilus influenzae type b (Hib) vaccine.** / Consult your health care provider. Ages 7 to 53 years  Blood pressure check.** / Every year.  Lipid and cholesterol check.** / Every 5 years beginning at age 25 years.  Lung cancer screening. / Every year if you are aged 11-80 years and have a 30-pack-year history of smoking and currently smoke or have quit within the past 15 years. Yearly screening is stopped once you have quit smoking for at least 15 years or develop a health problem that would prevent you from having lung cancer treatment.  Clinical breast exam.** / Every year after age 48 years.  BRCA-related cancer risk assessment.** / For women who have family members with a BRCA-related cancer (breast, ovarian, tubal, or peritoneal cancers).  Mammogram.** / Every year beginning at age 41 years and continuing for as long as you are in good health. Consult with your health care provider.  Pap test.** / Every 3 years starting at age 65 years through age 37 or 70 years with a history of 3 consecutive normal Pap tests.  HPV screening.** / Every 3 years from ages 72 years through ages 60 to 40 years with a history of 3 consecutive normal Pap tests.  Fecal occult blood test (FOBT) of stool. / Every year beginning at age 21 years and continuing until age 5 years. You may not need to do this test if you get  a colonoscopy every 10 years.  Flexible sigmoidoscopy or colonoscopy.** / Every 5 years for a flexible sigmoidoscopy or every 10 years for a colonoscopy beginning at age 35 years and continuing until age 48 years.  Hepatitis C blood test.** / For all people born from [birth year] through 1965 and any individual with known risks for hepatitis C.  Skin self-exam. / Monthly.  Influenza vaccine. / Every year.  Tetanus, diphtheria, and acellular pertussis (Tdap/Td) vaccine.** / Consult your health care provider. Pregnant women should receive 1 dose of Tdap vaccine during each pregnancy. 1 dose of Td every 10 years.  Varicella vaccine.** / Consult your health care provider. Pregnant females who do not have evidence of immunity should receive the first dose after pregnancy.  Zoster vaccine.** / 1 dose for adults aged 30 years or older.  Measles, mumps, rubella (MMR) vaccine.** / You need at least 1 dose of MMR if you were born in 1957 or later. You may also need a second dose. For females of childbearing age, rubella immunity should be determined. If there is no evidence of immunity, females who are not pregnant should be vaccinated. If there is no evidence of immunity, females who are pregnant should delay immunization until after pregnancy.  Pneumococcal 13-valent conjugate (PCV13) vaccine.** / Consult your health care provider.  Pneumococcal polysaccharide (PPSV23) vaccine.** / 1 to 2 doses if you smoke cigarettes or if you have certain conditions.  Meningococcal vaccine.** /  Consult your health care provider.  Hepatitis A vaccine.** / Consult your health care provider.  Hepatitis B vaccine.** / Consult your health care provider.  Haemophilus influenzae type b (Hib) vaccine.** / Consult your health care provider. Ages 64 years and over  Blood pressure check.** / Every year.  Lipid and cholesterol check.** / Every 5 years beginning at age 23 years.  Lung cancer screening. / Every year if you  are aged 16-80 years and have a 30-pack-year history of smoking and currently smoke or have quit within the past 15 years. Yearly screening is stopped once you have quit smoking for at least 15 years or develop a health problem that would prevent you from having lung cancer treatment.  Clinical breast exam.** / Every year after age 74 years.  BRCA-related cancer risk assessment.** / For women who have family members with a BRCA-related cancer (breast, ovarian, tubal, or peritoneal cancers).  Mammogram.** / Every year beginning at age 44 years and continuing for as long as you are in good health. Consult with your health care provider.  Pap test.** / Every 3 years starting at age 58 years through age 22 or 39 years with 3 consecutive normal Pap tests. Testing can be stopped between 65 and 70 years with 3 consecutive normal Pap tests and no abnormal Pap or HPV tests in the past 10 years.  HPV screening.** / Every 3 years from ages 64 years through ages 70 or 61 years with a history of 3 consecutive normal Pap tests. Testing can be stopped between 65 and 70 years with 3 consecutive normal Pap tests and no abnormal Pap or HPV tests in the past 10 years.  Fecal occult blood test (FOBT) of stool. / Every year beginning at age 40 years and continuing until age 27 years. You may not need to do this test if you get a colonoscopy every 10 years.  Flexible sigmoidoscopy or colonoscopy.** / Every 5 years for a flexible sigmoidoscopy or every 10 years for a colonoscopy beginning at age 7 years and continuing until age 32 years.  Hepatitis C blood test.** / For all people born from [birth year] through 1965 and any individual with known risks for hepatitis C.  Osteoporosis screening.** / A one-time screening for women ages 30 years and over and women at risk for fractures or osteoporosis.  Skin self-exam. / Monthly.  Influenza vaccine. / Every year.  Tetanus, diphtheria, and acellular pertussis (Tdap/Td)  vaccine.** / 1 dose of Td every 10 years.  Varicella vaccine.** / Consult your health care provider.  Zoster vaccine.** / 1 dose for adults aged 35 years or older.  Pneumococcal 13-valent conjugate (PCV13) vaccine.** / Consult your health care provider.  Pneumococcal polysaccharide (PPSV23) vaccine.** / 1 dose for all adults aged 46 years and older.  Meningococcal vaccine.** / Consult your health care provider.  Hepatitis A vaccine.** / Consult your health care provider.  Hepatitis B vaccine.** / Consult your health care provider.  Haemophilus influenzae type b (Hib) vaccine.** / Consult your health care provider. ** Family history and personal history of risk and conditions may change your health care provider's recommendations.   This information is not intended to replace advice given to you by your health care provider. Make sure you discuss any questions you have with your health care provider.   Document Released: 04/10/2001 Document Revised: 03/05/2014 Document Reviewed: 07/10/2010 Elsevier Interactive Patient Education [employer].

## 2015-01-07 NOTE — Progress Notes (Signed)
Pre visit review using our clinic review tool, if applicable. No additional management support is needed unless otherwise documented below in the visit note. 

## 2015-01-12 LAB — CYTOLOGY - PAP

## 2015-01-13 ENCOUNTER — Other Ambulatory Visit: Payer: Self-pay | Admitting: Family Medicine

## 2015-01-13 ENCOUNTER — Telehealth: Payer: Self-pay | Admitting: Family Medicine

## 2015-01-13 ENCOUNTER — Other Ambulatory Visit (INDEPENDENT_AMBULATORY_CARE_PROVIDER_SITE_OTHER): Payer: PRIVATE HEALTH INSURANCE

## 2015-01-13 DIAGNOSIS — I10 Essential (primary) hypertension: Secondary | ICD-10-CM

## 2015-01-13 DIAGNOSIS — Z0000 Encounter for general adult medical examination without abnormal findings: Secondary | ICD-10-CM | POA: Diagnosis not present

## 2015-01-13 LAB — CBC WITH DIFFERENTIAL/PLATELET
BASOS ABS: 0 10*3/uL (ref 0.0–0.1)
BASOS PCT: 0.4 % (ref 0.0–3.0)
EOS ABS: 0.1 10*3/uL (ref 0.0–0.7)
Eosinophils Relative: 1.4 % (ref 0.0–5.0)
HEMATOCRIT: 39.3 % (ref 36.0–46.0)
HEMOGLOBIN: 13.3 g/dL (ref 12.0–15.0)
LYMPHS PCT: 19.8 % (ref 12.0–46.0)
Lymphs Abs: 1.3 10*3/uL (ref 0.7–4.0)
MCHC: 33.7 g/dL (ref 30.0–36.0)
MCV: 87.1 fl (ref 78.0–100.0)
MONO ABS: 0.4 10*3/uL (ref 0.1–1.0)
Monocytes Relative: 6.5 % (ref 3.0–12.0)
Neutro Abs: 4.9 10*3/uL (ref 1.4–7.7)
Neutrophils Relative %: 71.9 % (ref 43.0–77.0)
Platelets: 304 10*3/uL (ref 150.0–400.0)
RBC: 4.51 Mil/uL (ref 3.87–5.11)
RDW: 12.8 % (ref 11.5–15.5)
WBC: 6.8 10*3/uL (ref 4.0–10.5)

## 2015-01-13 LAB — COMPREHENSIVE METABOLIC PANEL
ALBUMIN: 4.2 g/dL (ref 3.5–5.2)
ALK PHOS: 70 U/L (ref 39–117)
ALT: 47 U/L — AB (ref 0–35)
AST: 28 U/L (ref 0–37)
BILIRUBIN TOTAL: 0.5 mg/dL (ref 0.2–1.2)
BUN: 12 mg/dL (ref 6–23)
CALCIUM: 8.9 mg/dL (ref 8.4–10.5)
CO2: 25 mEq/L (ref 19–32)
CREATININE: 0.82 mg/dL (ref 0.40–1.20)
Chloride: 102 mEq/L (ref 96–112)
GFR: 80.24 mL/min (ref >60.00)
Glucose, Bld: 110 mg/dL — ABNORMAL HIGH (ref 70–99)
Potassium: 3.8 mEq/L (ref 3.5–5.1)
SODIUM: 136 meq/L (ref 135–145)
TOTAL PROTEIN: 6.8 g/dL (ref 6.0–8.3)

## 2015-01-13 LAB — LIPID PANEL
CHOLESTEROL: 163 mg/dL (ref 0–200)
HDL: 47.9 mg/dL (ref >39.00)
LDL CALC: 100 mg/dL — AB (ref 0–99)
NonHDL: 114.78
TRIGLYCERIDES: 74 mg/dL (ref 0.0–149.0)
Total CHOL/HDL Ratio: 3
VLDL: 14.8 mg/dL (ref 0.0–40.0)

## 2015-01-13 LAB — POCT URINALYSIS DIPSTICK
BILIRUBIN UA: NEGATIVE
Blood, UA: NEGATIVE
GLUCOSE UA: NEGATIVE
Ketones, UA: NEGATIVE
Leukocytes, UA: NEGATIVE
Nitrite, UA: NEGATIVE
Protein, UA: NEGATIVE
UROBILINOGEN UA: 0.2
pH, UA: 5.5

## 2015-01-13 LAB — TSH: TSH: 1.87 u[IU]/mL (ref 0.35–4.50)

## 2015-01-13 NOTE — Telephone Encounter (Signed)
Form filled out as much as possible, awaiting lab results to complete form. JG/CMA

## 2015-01-13 NOTE — Telephone Encounter (Signed)
Pt dropped off a preventive care confirmation form for Dr. Etter Sjogren to fill out states to mail the form to her, form was left in your tray at the front desk.

## 2015-01-28 ENCOUNTER — Ambulatory Visit (INDEPENDENT_AMBULATORY_CARE_PROVIDER_SITE_OTHER): Payer: PRIVATE HEALTH INSURANCE | Admitting: Psychology

## 2015-01-28 DIAGNOSIS — F4323 Adjustment disorder with mixed anxiety and depressed mood: Secondary | ICD-10-CM | POA: Diagnosis not present

## 2015-02-01 ENCOUNTER — Other Ambulatory Visit: Payer: Self-pay | Admitting: Family Medicine

## 2015-02-02 NOTE — Telephone Encounter (Signed)
Pt is requesting refill on Phentermine. Lowne Pt.  Last OV: 01/07/2015 Last Fill: 01/07/2015 #30 and 0RF   Please advise.

## 2015-02-02 NOTE — Telephone Encounter (Signed)
Completed form mailed to pt's home address, per pt request. Copy sent for scanning. JG//CMA

## 2015-02-02 NOTE — Telephone Encounter (Signed)
Rx faxed to [facility] pharmacy.  

## 2015-02-02 NOTE — Telephone Encounter (Signed)
OK #15, further refills per PCP

## 2015-02-10 ENCOUNTER — Ambulatory Visit (INDEPENDENT_AMBULATORY_CARE_PROVIDER_SITE_OTHER): Payer: PRIVATE HEALTH INSURANCE | Admitting: Family Medicine

## 2015-02-10 ENCOUNTER — Encounter: Payer: Self-pay | Admitting: Family Medicine

## 2015-02-10 DIAGNOSIS — G4700 Insomnia, unspecified: Secondary | ICD-10-CM

## 2015-02-10 MED ORDER — TRAZODONE HCL 50 MG PO TABS: ORAL_TABLET | ORAL | Status: DC

## 2015-02-10 MED ORDER — PHENTERMINE HCL 37.5 MG PO CAPS: 37.5000 mg | ORAL_CAPSULE | Freq: Every morning | ORAL | Status: DC

## 2015-02-10 NOTE — Assessment & Plan Note (Signed)
Refill phenteramine  con't diet and exercise. rto 1 month

## 2015-02-10 NOTE — Progress Notes (Signed)
Pre visit review using our clinic review tool, if applicable. No additional management support is needed unless otherwise documented below in the visit note. 

## 2015-02-10 NOTE — Progress Notes (Signed)
Patient ID: Kristi Ramos, female    DOB: 1970/07/10  Age: 44 y.o. MRN: [id number]    Subjective:  Subjective HPI Kristi Ramos presents for weight check.  She is doing a clean eating diet.  Pt has been doing well with diet and they are walking the dogs.     Review of Systems  Constitutional: Negative for diaphoresis, appetite change, fatigue and unexpected weight change.  Eyes: Negative for pain, redness and visual disturbance.  Respiratory: Negative for cough, chest tightness, shortness of breath and wheezing.   Cardiovascular: Negative for chest pain, palpitations and leg swelling.  Endocrine: Negative for cold intolerance, heat intolerance, polydipsia, polyphagia and polyuria.  Genitourinary: Negative for dysuria, frequency and difficulty urinating.  Neurological: Negative for dizziness, light-headedness, numbness and headaches.    History Past Medical History  Diagnosis Date  . Uterine cancer ([place]) 2010  . Cervical cancer ([place]) 2010  . Chicken pox   . Depression 2009  . GERD (gastroesophageal reflux disease)   . Seasonal allergies   . HTN (hypertension)     She has past surgical history that includes Cholecystectomy; Ankle surgery (Right); Leg Surgery; and Abdominal hysterectomy (2010).   Her family history includes Alcohol abuse in her maternal grandfather and paternal grandmother; Arthritis in her father and mother; Depression in her son; Drug abuse in her son; Heart attack in her paternal grandfather; Heart attack (age of onset: 71) in her father; Heart disease in her father and paternal grandfather; Hyperlipidemia in her father; Hypertension in her brother and mother; Stroke in her mother; Thyroid disease in her mother.She reports that she has never smoked. She has never used smokeless tobacco. She reports that she drinks alcohol. She reports that she does not use illicit drugs.  Current Outpatient Prescriptions on File Prior to Visit  Medication Sig  Dispense Refill  . lisinopril-hydrochlorothiazide (PRINZIDE,ZESTORETIC) 10-12.5 MG tablet TAKE 1 TABLET BY MOUTH DAILY 30 tablet 5  . omeprazole (PRILOSEC) 20 MG capsule Take 1 capsule (20 mg total) by mouth daily. 30 capsule 11   No current facility-administered medications on file prior to visit.     Objective:  Objective Physical Exam  Constitutional: She is oriented to person, place, and time. She appears well-developed and well-nourished.  HENT:  Head: Normocephalic and atraumatic.  Eyes: Conjunctivae and EOM are normal.  Neck: Normal range of motion. Neck supple. No JVD present. Carotid bruit is not present. No thyromegaly present.  Cardiovascular: Normal rate, regular rhythm and normal heart sounds.   No murmur heard. Pulmonary/Chest: Effort normal and breath sounds normal. No respiratory distress. She has no wheezes. She has no rales. She exhibits no tenderness.  Musculoskeletal: She exhibits no edema.  Neurological: She is alert and oriented to person, place, and time.  Psychiatric: She has a normal mood and affect. Her behavior is normal.  Nursing note and vitals reviewed.  BP 120/70 mmHg  Pulse 81  Temp(Src) 98.2 F (36.8 C) (Oral)  Ht 5\' 3"  (1.6 m)  Wt 229 lb 12.8 oz (104.237 kg)  BMI 40.72 kg/m2  SpO2 98% Wt Readings from Last 3 Encounters:  02/10/15 229 lb 12.8 oz (104.237 kg)  01/07/15 237 lb 6.4 oz (107.684 kg)  09/24/14 234 lb (106.142 kg)     Lab Results  Component Value Date   WBC 6.8 01/13/2015   HGB 13.3 01/13/2015   HCT 39.3 01/13/2015   PLT 304.0 01/13/2015   GLUCOSE 110* 01/13/2015   CHOL 163 01/13/2015   TRIG 74.0  01/13/2015   HDL 47.90 01/13/2015   LDLCALC 100* 01/13/2015   ALT 47* 01/13/2015   AST 28 01/13/2015   NA 136 01/13/2015   K 3.8 01/13/2015   CL 102 01/13/2015   CREATININE 0.82 01/13/2015   BUN 12 01/13/2015   CO2 25 01/13/2015   TSH 1.87 01/13/2015   HGBA1C 5.8 12/24/2013    No results found.   Assessment & Plan:    Plan I am having Ms. Stecklein maintain her omeprazole, lisinopril-hydrochlorothiazide, phentermine, and traZODone.  Meds ordered this encounter  Medications  . phentermine 37.5 MG capsule    Sig: Take 1 capsule (37.5 mg total) by mouth every morning.    Dispense:  30 capsule    Refill:  0  . traZODone (DESYREL) 50 MG tablet    Sig: TAKE 1 TO 2 TABLETS(50 TO 100 MG) BY MOUTH AT BEDTIME AS NEEDED FOR SLEEP    Dispense:  60 tablet    Refill:  3    Problem List Items Addressed This Visit    Morbid obesity ([place]) - Primary    Refill phenteramine  con't diet and exercise. rto 1 month      Relevant Medications   phentermine 37.5 MG capsule   Insomnia   Relevant Medications   traZODone (DESYREL) 50 MG tablet      Follow-up: Return in about 4 weeks (around 03/10/2015), or if symptoms worsen or fail to improve.  Garnet Koyanagi, DO

## 2015-02-10 NOTE — Patient Instructions (Signed)
Obesity Obesity is defined as having too much total body fat and a body mass index (BMI) of 30 or more. BMI is an estimate of body fat and is calculated from your height and weight. BMI is typically calculated by your health care provider during regular wellness visits. Obesity happens when you consume more calories than you can burn by exercising or performing daily physical tasks. Prolonged obesity can cause major illnesses or emergencies, such as:  Stroke.  Heart disease.  Diabetes.  Cancer.  Arthritis.  High blood pressure (hypertension).  High cholesterol.  Sleep apnea.  Erectile dysfunction.  Infertility problems. CAUSES   Regularly eating unhealthy foods.  Physical inactivity.  Certain disorders, such as an underactive thyroid (hypothyroidism), Cushing's syndrome, and polycystic ovarian syndrome.  Certain medicines, such as steroids, some depression medicines, and antipsychotics.  Genetics.  Lack of sleep. DIAGNOSIS A health care provider can diagnose obesity after calculating your BMI. Obesity will be diagnosed if your BMI is 30 or higher. There are other methods of measuring obesity levels. Some other methods include measuring your skinfold thickness, your waist circumference, and comparing your hip circumference to your waist circumference. TREATMENT  A healthy treatment program includes some or all of the following:  Long-term dietary changes.  Exercise and physical activity.  Behavioral and lifestyle changes.  Medicine only under the supervision of your health care provider. Medicines may help, but only if they are used with diet and exercise programs. If your BMI is 40 or higher, your health care provider may recommend specialized surgery or programs to help with weight loss. An unhealthy treatment program includes:  Fasting.  Fad diets.  Supplements and drugs. These choices do not succeed in long-term weight control. HOME CARE  INSTRUCTIONS  Exercise and perform physical activity as directed by your health care provider. To increase physical activity, try the following:  Use stairs instead of elevators.  Park farther away from store entrances.  Garden, bike, or walk instead of watching television or using the computer.  Eat healthy, low-calorie foods and drinks on a regular basis. Eat more fruits and vegetables. Use low-calorie cookbooks or take healthy cooking classes.  Limit fast food, sweets, and processed snack foods.  Eat smaller portions.  Keep a daily journal of everything you eat. There are many free websites to help you with this. It may be helpful to measure your foods so you can determine if you are eating the correct portion sizes.  Avoid drinking alcohol. Drink more water and drinks without calories.  Take vitamins and supplements only as recommended by your health care provider.  Weight-loss support groups, registered dietitians, counselors, and stress reduction education can also be very helpful. SEEK IMMEDIATE MEDICAL CARE IF:  You have chest pain or tightness.  You have trouble breathing or feel short of breath.  You have weakness or leg numbness.  You feel confused or have trouble talking.  You have sudden changes in your vision.   This information is not intended to replace advice given to you by your health care provider. Make sure you discuss any questions you have with your health care provider.   Document Released: 03/22/2004 Document Revised: 03/05/2014 Document Reviewed: 03/21/2011 Elsevier Interactive Patient Education 2016 Elsevier Inc.  

## 2015-02-11 ENCOUNTER — Ambulatory Visit: Payer: PRIVATE HEALTH INSURANCE | Admitting: Psychology

## 2015-03-10 ENCOUNTER — Ambulatory Visit (INDEPENDENT_AMBULATORY_CARE_PROVIDER_SITE_OTHER): Payer: Managed Care, Other (non HMO) | Admitting: Family Medicine

## 2015-03-10 ENCOUNTER — Encounter: Payer: Self-pay | Admitting: Family Medicine

## 2015-03-10 DIAGNOSIS — I10 Essential (primary) hypertension: Secondary | ICD-10-CM

## 2015-03-10 MED ORDER — PHENTERMINE HCL 37.5 MG PO CAPS: 37.5000 mg | ORAL_CAPSULE | Freq: Every morning | ORAL | Status: DC

## 2015-03-10 NOTE — Assessment & Plan Note (Signed)
con't with diet and exercise Refill med rto 1 month

## 2015-03-10 NOTE — Assessment & Plan Note (Signed)
Stable Cont' lisinopril hct  

## 2015-03-10 NOTE — Progress Notes (Signed)
Pre visit review using our clinic review tool, if applicable. No additional management support is needed unless otherwise documented below in the visit note. 

## 2015-03-10 NOTE — Progress Notes (Signed)
Patient ID: Kristi Ramos, female    DOB: 12/15/1970  Age: 45 y.o. MRN: [id number]    Subjective:  Subjective HPI Kristi Ramos presents to discuss weight loss -- she started "the next 56 days" and needs a refill  Review of Systems  Constitutional: Negative for diaphoresis, appetite change, fatigue and unexpected weight change.  Eyes: Negative for pain, redness and visual disturbance.  Respiratory: Negative for cough, chest tightness, shortness of breath and wheezing.   Cardiovascular: Negative for chest pain, palpitations and leg swelling.  Endocrine: Negative for cold intolerance, heat intolerance, polydipsia, polyphagia and polyuria.  Genitourinary: Negative for dysuria, frequency and difficulty urinating.  Neurological: Negative for dizziness, light-headedness, numbness and headaches.    History Past Medical History  Diagnosis Date  . Uterine cancer ([place]) 2010  . Cervical cancer ([place]) 2010  . Chicken pox   . Depression 2009  . GERD (gastroesophageal reflux disease)   . Seasonal allergies   . HTN (hypertension)     She has past surgical history that includes Cholecystectomy; Ankle surgery (Right); Leg Surgery; and Abdominal hysterectomy (2010).   Her family history includes Alcohol abuse in her maternal grandfather and paternal grandmother; Arthritis in her father and mother; Depression in her son; Drug abuse in her son; Heart attack in her paternal grandfather; Heart attack (age of onset: 72) in her father; Heart disease in her father and paternal grandfather; Hyperlipidemia in her father; Hypertension in her brother and mother; Stroke in her mother; Thyroid disease in her mother.She reports that she has never smoked. She has never used smokeless tobacco. She reports that she drinks alcohol. She reports that she does not use illicit drugs.  Current Outpatient Prescriptions on File Prior to Visit  Medication Sig Dispense Refill  . lisinopril-hydrochlorothiazide  (PRINZIDE,ZESTORETIC) 10-12.5 MG tablet TAKE 1 TABLET BY MOUTH DAILY 30 tablet 5  . omeprazole (PRILOSEC) 20 MG capsule Take 1 capsule (20 mg total) by mouth daily. 30 capsule 11  . traZODone (DESYREL) 50 MG tablet TAKE 1 TO 2 TABLETS(50 TO 100 MG) BY MOUTH AT BEDTIME AS NEEDED FOR SLEEP 60 tablet 3   No current facility-administered medications on file prior to visit.     Objective:  Objective Physical Exam  Constitutional: She is oriented to person, place, and time. She appears well-developed and well-nourished.  HENT:  Head: Normocephalic and atraumatic.  Eyes: Conjunctivae and EOM are normal.  Neck: Normal range of motion. Neck supple. No JVD present. Carotid bruit is not present. No thyromegaly present.  Cardiovascular: Normal rate, regular rhythm and normal heart sounds.   No murmur heard. Pulmonary/Chest: Effort normal and breath sounds normal. No respiratory distress. She has no wheezes. She has no rales. She exhibits no tenderness.  Musculoskeletal: She exhibits no edema.  Neurological: She is alert and oriented to person, place, and time.  Psychiatric: She has a normal mood and affect. Her behavior is normal.  Nursing note and vitals reviewed.  BP 120/78 mmHg  Pulse 100  Temp(Src) 98.2 F (36.8 C) (Oral)  Ht 5\' 3"  (1.6 m)  Wt 225 lb 9.6 oz (102.331 kg)  BMI 39.97 kg/m2  SpO2 98% Wt Readings from Last 3 Encounters:  03/10/15 225 lb 9.6 oz (102.331 kg)  02/10/15 229 lb 12.8 oz (104.237 kg)  01/07/15 237 lb 6.4 oz (107.684 kg)     Lab Results  Component Value Date   WBC 6.8 01/13/2015   HGB 13.3 01/13/2015   HCT 39.3 01/13/2015   PLT 304.0  01/13/2015   GLUCOSE 110* 01/13/2015   CHOL 163 01/13/2015   TRIG 74.0 01/13/2015   HDL 47.90 01/13/2015   LDLCALC 100* 01/13/2015   ALT 47* 01/13/2015   AST 28 01/13/2015   NA 136 01/13/2015   K 3.8 01/13/2015   CL 102 01/13/2015   CREATININE 0.82 01/13/2015   BUN 12 01/13/2015   CO2 25 01/13/2015   TSH 1.87  01/13/2015   HGBA1C 5.8 12/24/2013    No results found.   Assessment & Plan:  Plan I am having Kristi Ramos maintain her omeprazole, lisinopril-hydrochlorothiazide, traZODone, and phentermine.  Meds ordered this encounter  Medications  . phentermine 37.5 MG capsule    Sig: Take 1 capsule (37.5 mg total) by mouth every morning.    Dispense:  30 capsule    Refill:  0    Problem List Items Addressed This Visit    Morbid obesity ([place]) - Primary    con't with diet and exercise Refill med rto 1 month      Relevant Medications   phentermine 37.5 MG capsule   HTN (hypertension)    Stable Con'[t lisinopril hct         Follow-up: Return in about 4 weeks (around 04/07/2015), or if symptoms worsen or fail to improve.  Garnet Koyanagi, DO

## 2015-03-10 NOTE — Patient Instructions (Signed)

## 2015-03-11 ENCOUNTER — Encounter: Payer: 59 | Admitting: Family Medicine

## 2015-03-17 ENCOUNTER — Telehealth: Payer: Self-pay | Admitting: Family Medicine

## 2015-03-17 MED ORDER — PHENTERMINE HCL 37.5 MG PO CAPS: 37.5000 mg | ORAL_CAPSULE | Freq: Every morning | ORAL | Status: DC

## 2015-03-17 NOTE — Telephone Encounter (Signed)
Relation to [id number]  Call back [phone]   Reason for call:  Patient states she is sorry but she threw away the Rx for phentermine 37.5 MG capsule, requesting to pick up Rx today.

## 2015-03-17 NOTE — Telephone Encounter (Signed)
Patient aware Rx  Being faxed.    KP

## 2015-03-17 NOTE — Telephone Encounter (Signed)
We will do it one time

## 2015-03-17 NOTE — Telephone Encounter (Signed)
Last seen 03/10/15. Please advise      KP

## 2015-04-07 ENCOUNTER — Encounter: Payer: Self-pay | Admitting: Family Medicine

## 2015-04-07 ENCOUNTER — Ambulatory Visit (INDEPENDENT_AMBULATORY_CARE_PROVIDER_SITE_OTHER): Payer: Managed Care, Other (non HMO) | Admitting: Family Medicine

## 2015-04-07 VITALS — BP 114/74 | HR 78 | Temp 98.0°F | Ht 63.0 in | Wt 214.8 lb

## 2015-04-07 DIAGNOSIS — R350 Frequency of micturition: Secondary | ICD-10-CM | POA: Diagnosis not present

## 2015-04-07 LAB — POC URINALSYSI DIPSTICK (AUTOMATED)
Bilirubin, UA: NEGATIVE
Glucose, UA: NEGATIVE
Ketones, UA: NEGATIVE
LEUKOCYTES UA: NEGATIVE
NITRITE UA: NEGATIVE
PH UA: 6
PROTEIN UA: NEGATIVE
RBC UA: NEGATIVE
Spec Grav, UA: 1.025
UROBILINOGEN UA: 2

## 2015-04-07 MED ORDER — PHENTERMINE HCL 37.5 MG PO CAPS: 37.5000 mg | ORAL_CAPSULE | Freq: Every morning | ORAL | Status: DC

## 2015-04-07 NOTE — Progress Notes (Signed)
Patient ID: Kristi Ramos, female    DOB: 12-Dec-1970  Age: 45 y.o. MRN: [id number]    Subjective:  Subjective HPI Rashidah Tortoriello presents for weight check ---- she is doing well with diet and has lost 23 lbs.    Review of Systems  Constitutional: Negative for diaphoresis, appetite change, fatigue and unexpected weight change.  Eyes: Negative for pain, redness and visual disturbance.  Respiratory: Negative for cough, chest tightness, shortness of breath and wheezing.   Cardiovascular: Negative for chest pain, palpitations and leg swelling.  Endocrine: Negative for cold intolerance, heat intolerance, polydipsia, polyphagia and polyuria.  Genitourinary: Negative for dysuria, frequency and difficulty urinating.  Neurological: Negative for dizziness, light-headedness, numbness and headaches.    History Past Medical History  Diagnosis Date  . Uterine cancer ([place]) 2010  . Cervical cancer ([place]) 2010  . Chicken pox   . Depression 2009  . GERD (gastroesophageal reflux disease)   . Seasonal allergies   . HTN (hypertension)     She has past surgical history that includes Cholecystectomy; Ankle surgery (Right); Leg Surgery; and Abdominal hysterectomy (2010).   Her family history includes Alcohol abuse in her maternal grandfather and paternal grandmother; Arthritis in her father and mother; Depression in her son; Drug abuse in her son; Heart attack in her paternal grandfather; Heart attack (age of onset: 21) in her father; Heart disease in her father and paternal grandfather; Hyperlipidemia in her father; Hypertension in her brother and mother; Stroke in her mother; Thyroid disease in her mother.She reports that she has never smoked. She has never used smokeless tobacco. She reports that she drinks alcohol. She reports that she does not use illicit drugs.  Current Outpatient Prescriptions on File Prior to Visit  Medication Sig Dispense Refill  . lisinopril-hydrochlorothiazide  (PRINZIDE,ZESTORETIC) 10-12.5 MG tablet TAKE 1 TABLET BY MOUTH DAILY 30 tablet 5  . omeprazole (PRILOSEC) 20 MG capsule Take 1 capsule (20 mg total) by mouth daily. 30 capsule 11  . traZODone (DESYREL) 50 MG tablet TAKE 1 TO 2 TABLETS(50 TO 100 MG) BY MOUTH AT BEDTIME AS NEEDED FOR SLEEP 60 tablet 3   No current facility-administered medications on file prior to visit.     Objective:  Objective Physical Exam  Constitutional: She is oriented to person, place, and time. She appears well-developed and well-nourished.  HENT:  Head: Normocephalic and atraumatic.  Eyes: Conjunctivae and EOM are normal.  Neck: Normal range of motion. Neck supple. No JVD present. Carotid bruit is not present. No thyromegaly present.  Cardiovascular: Normal rate, regular rhythm and normal heart sounds.   No murmur heard. Pulmonary/Chest: Effort normal and breath sounds normal. No respiratory distress. She has no wheezes. She has no rales. She exhibits no tenderness.  Musculoskeletal: She exhibits no edema.  Neurological: She is alert and oriented to person, place, and time.  Psychiatric: She has a normal mood and affect. Her behavior is normal. Judgment and thought content normal.  Nursing note and vitals reviewed.  BP 114/74 mmHg  Pulse 78  Temp(Src) 98 F (36.7 C) (Oral)  Ht 5\' 3"  (1.6 m)  Wt 214 lb 12.8 oz (97.433 kg)  BMI 38.06 kg/m2  SpO2 98% Wt Readings from Last 3 Encounters:  04/07/15 214 lb 12.8 oz (97.433 kg)  03/10/15 225 lb 9.6 oz (102.331 kg)  02/10/15 229 lb 12.8 oz (104.237 kg)     Lab Results  Component Value Date   WBC 6.8 01/13/2015   HGB 13.3 01/13/2015  HCT 39.3 01/13/2015   PLT 304.0 01/13/2015   GLUCOSE 110* 01/13/2015   CHOL 163 01/13/2015   TRIG 74.0 01/13/2015   HDL 47.90 01/13/2015   LDLCALC 100* 01/13/2015   ALT 47* 01/13/2015   AST 28 01/13/2015   NA 136 01/13/2015   K 3.8 01/13/2015   CL 102 01/13/2015   CREATININE 0.82 01/13/2015   BUN 12 01/13/2015   CO2  25 01/13/2015   TSH 1.87 01/13/2015   HGBA1C 5.8 12/24/2013    No results found.   Assessment & Plan:  Plan I am having Ms. Brittingham maintain her omeprazole, lisinopril-hydrochlorothiazide, traZODone, and phentermine.  Meds ordered this encounter  Medications  . phentermine 37.5 MG capsule    Sig: Take 1 capsule (37.5 mg total) by mouth every morning.    Dispense:  30 capsule    Refill:  0    Problem List Items Addressed This Visit      Unprioritized   Morbid obesity ([place])    phenteramine daily rto 1 month con't exercise and diet      Relevant Medications   phentermine 37.5 MG capsule    Other Visit Diagnoses    Urine frequency    -  Primary    Relevant Orders    POCT Urinalysis Dipstick (Automated) (Completed)       Follow-up: No Follow-up on file.  Garnet Koyanagi, DO

## 2015-04-07 NOTE — Assessment & Plan Note (Signed)
phenteramine daily rto 1 month con't exercise and diet

## 2015-04-07 NOTE — Progress Notes (Signed)
Pre visit review using our clinic review tool, if applicable. No additional management support is needed unless otherwise documented below in the visit note. 

## 2015-04-15 ENCOUNTER — Ambulatory Visit: Payer: Self-pay | Admitting: Family Medicine

## 2015-05-05 ENCOUNTER — Encounter: Payer: Self-pay | Admitting: Family Medicine

## 2015-05-05 ENCOUNTER — Ambulatory Visit (INDEPENDENT_AMBULATORY_CARE_PROVIDER_SITE_OTHER): Payer: 59 | Admitting: Family Medicine

## 2015-05-05 DIAGNOSIS — I10 Essential (primary) hypertension: Secondary | ICD-10-CM | POA: Diagnosis not present

## 2015-05-05 DIAGNOSIS — J069 Acute upper respiratory infection, unspecified: Secondary | ICD-10-CM | POA: Diagnosis not present

## 2015-05-05 DIAGNOSIS — R1031 Right lower quadrant pain: Secondary | ICD-10-CM | POA: Diagnosis not present

## 2015-05-05 MED ORDER — PHENTERMINE HCL 37.5 MG PO CAPS: 37.5000 mg | ORAL_CAPSULE | Freq: Every morning | ORAL | Status: DC

## 2015-05-05 MED FILL — PHENTERMINE 37.5 MG CAPSULE: 37.5 | 30 days supply | Qty: 30 | Fill #0

## 2015-05-05 NOTE — Assessment & Plan Note (Signed)
Refill phenteramine Discussed diet and exercise rto 1 month

## 2015-05-05 NOTE — Patient Instructions (Signed)

## 2015-05-05 NOTE — Assessment & Plan Note (Signed)
Antihistamine  mucinex Drink plenty of fluids

## 2015-05-05 NOTE — Progress Notes (Signed)
Patient ID: Kristi Ramos, female    DOB: [birth year]  Age: 45 y.o. MRN: [id number]    Subjective:  Subjective HPI Beulah Sybert presents for weight check.  She is doing great with diet and will start exercising after the wedding.  She is c/o cough and clear mucous.  She had the flu a few weeks ago-- she had bodyaches and fever -- that has resolved -- her boss gave her a z pack and she is still coughing.   She also c/o pain in RLQ x several days.  She noticed it is worse with walking etc.  No nv, no more fever.    Review of Systems  Constitutional: Negative for diaphoresis, appetite change, fatigue and unexpected weight change.  Eyes: Negative for pain, redness and visual disturbance.  Respiratory: Negative for cough, chest tightness, shortness of breath and wheezing.   Cardiovascular: Negative for chest pain, palpitations and leg swelling.  Gastrointestinal: Positive for abdominal pain. Negative for nausea, vomiting, diarrhea, constipation, blood in stool, abdominal distention and rectal pain.  Endocrine: Negative for cold intolerance, heat intolerance, polydipsia, polyphagia and polyuria.  Genitourinary: Negative for dysuria, frequency and difficulty urinating.  Neurological: Negative for dizziness, light-headedness, numbness and headaches.    History Past Medical History  Diagnosis Date  . Uterine cancer ([place]) 2010  . Cervical cancer ([place]) 2010  . Chicken pox   . Depression 2009  . GERD (gastroesophageal reflux disease)   . Seasonal allergies   . HTN (hypertension)     She has past surgical history that includes Cholecystectomy; Ankle surgery (Right); Leg Surgery; and Abdominal hysterectomy (2010).   Her family history includes Alcohol abuse in her maternal grandfather and paternal grandmother; Arthritis in her father and mother; Depression in her son; Drug abuse in her son; Heart attack in her paternal grandfather; Heart attack (age of onset: 68) in her father;  Heart disease in her father and paternal grandfather; Hyperlipidemia in her father; Hypertension in her brother and mother; Stroke in her mother; Thyroid disease in her mother.She reports that she has never smoked. She has never used smokeless tobacco. She reports that she drinks alcohol. She reports that she does not use illicit drugs.  Current Outpatient Prescriptions on File Prior to Visit  Medication Sig Dispense Refill  . lisinopril-hydrochlorothiazide (PRINZIDE,ZESTORETIC) 10-12.5 MG tablet TAKE 1 TABLET BY MOUTH DAILY 30 tablet 5  . omeprazole (PRILOSEC) 20 MG capsule Take 1 capsule (20 mg total) by mouth daily. 30 capsule 11  . traZODone (DESYREL) 50 MG tablet TAKE 1 TO 2 TABLETS(50 TO 100 MG) BY MOUTH AT BEDTIME AS NEEDED FOR SLEEP 60 tablet 3   No current facility-administered medications on file prior to visit.     Objective:  Objective Physical Exam  Constitutional: She is oriented to person, place, and time. She appears well-developed and well-nourished.  HENT:  Head: Normocephalic and atraumatic.  Eyes: Conjunctivae and EOM are normal.  Neck: Normal range of motion. Neck supple. No JVD present. Carotid bruit is not present. No thyromegaly present.  Cardiovascular: Normal rate, regular rhythm and normal heart sounds.   No murmur heard. Pulmonary/Chest: Effort normal and breath sounds normal. No respiratory distress. She has no wheezes. She has no rales. She exhibits no tenderness.  Abdominal: Soft. Bowel sounds are normal. She exhibits no distension and no mass. There is no tenderness. There is no rebound and no guarding.  Musculoskeletal: She exhibits no edema.  Neurological: She is alert and oriented to person, place, and  time.  Psychiatric: She has a normal mood and affect.  Nursing note and vitals reviewed.  BP 120/76 mmHg  Pulse 96  Temp(Src) 98 F (36.7 C) (Oral)  Ht 5\' 3"  (1.6 m)  Wt 210 lb 6.4 oz (95.437 kg)  BMI 37.28 kg/m2  SpO2 99% Wt Readings from Last 3  Encounters:  05/05/15 210 lb 6.4 oz (95.437 kg)  04/07/15 214 lb 12.8 oz (97.433 kg)  03/10/15 225 lb 9.6 oz (102.331 kg)     Lab Results  Component Value Date   WBC 6.8 01/13/2015   HGB 13.3 01/13/2015   HCT 39.3 01/13/2015   PLT 304.0 01/13/2015   GLUCOSE 110* 01/13/2015   CHOL 163 01/13/2015   TRIG 74.0 01/13/2015   HDL 47.90 01/13/2015   LDLCALC 100* 01/13/2015   ALT 47* 01/13/2015   AST 28 01/13/2015   NA 136 01/13/2015   K 3.8 01/13/2015   CL 102 01/13/2015   CREATININE 0.82 01/13/2015   BUN 12 01/13/2015   CO2 25 01/13/2015   TSH 1.87 01/13/2015   HGBA1C 5.8 12/24/2013    No results found.   Assessment & Plan:  Plan I am having Ms. Mroczka maintain her omeprazole, lisinopril-hydrochlorothiazide, traZODone, and phentermine.  Meds ordered this encounter  Medications  . phentermine 37.5 MG capsule    Sig: Take 1 capsule (37.5 mg total) by mouth every morning.    Dispense:  30 capsule    Refill:  0    Problem List Items Addressed This Visit    Morbid obesity ([place]) - Primary    Refill phenteramine Discussed diet and exercise rto 1 month      Relevant Medications   phentermine 37.5 MG capsule   Acute upper respiratory infection    Antihistamine  mucinex Drink plenty of fluids       Other Visit Diagnoses    RLQ abdominal pain        Relevant Orders    US Pelvis Complete    US Transvaginal Non-OB       Follow-up: Return in about 4 weeks (around 06/02/2015), or if symptoms worsen or fail to improve, for weight loss.  Garnet Koyanagi, DO

## 2015-05-05 NOTE — Assessment & Plan Note (Signed)
Stable con't lisinopril hct con't diet and exercise

## 2015-05-05 NOTE — Progress Notes (Signed)
Pre visit review using our clinic review tool, if applicable. No additional management support is needed unless otherwise documented below in the visit note. 

## 2015-05-07 ENCOUNTER — Ambulatory Visit (HOSPITAL_BASED_OUTPATIENT_CLINIC_OR_DEPARTMENT_OTHER)
Admission: RE | Admit: 2015-05-07 | Discharge: 2015-05-07 | Disposition: A | Discharge status: Home / Self Care | Payer: 59 | Source: Ambulatory Visit | Attending: Family Medicine | Admitting: Family Medicine

## 2015-05-07 ENCOUNTER — Ambulatory Visit (HOSPITAL_BASED_OUTPATIENT_CLINIC_OR_DEPARTMENT_OTHER): Admission: RE | Admit: 2015-05-07 | Payer: 59 | Source: Ambulatory Visit

## 2015-05-07 DIAGNOSIS — R1031 Right lower quadrant pain: Secondary | ICD-10-CM

## 2015-05-07 DIAGNOSIS — Z90710 Acquired absence of both cervix and uterus: Secondary | ICD-10-CM | POA: Insufficient documentation

## 2015-05-11 ENCOUNTER — Other Ambulatory Visit: Payer: Self-pay

## 2015-05-11 DIAGNOSIS — N8300 Follicular cyst of ovary, unspecified side: Secondary | ICD-10-CM

## 2015-05-12 ENCOUNTER — Telehealth: Payer: Self-pay | Admitting: Family Medicine

## 2015-05-12 NOTE — Telephone Encounter (Signed)
If she thinks it could be AP she the pain is severe--- she should be seen or go ER

## 2015-05-12 NOTE — Telephone Encounter (Signed)
Relation to [id number] Call back number:[phone]   Reason for call:  Patient experiencing lower abdominal pain periodical and would like to rule out appendicitis. Please advise.

## 2015-05-12 NOTE — Telephone Encounter (Signed)
Spoke with patient and she stated she has pain in the pelvis on the right side that comes and goes and she wanted to know if it could potentially come from the ovarian cyst. I advised yes and she could take OTC ibuprofen to see if it helps with the discomfort. She verbalized understanding and has an apt with the GYN on Tuesday.    KP

## 2015-05-13 NOTE — Telephone Encounter (Signed)
Original note from tiffany said r/o AP

## 2015-05-13 NOTE — Telephone Encounter (Signed)
She did not say anything about appendicitis, she said she had a cyst on her ovary.    KP

## 2015-06-02 ENCOUNTER — Other Ambulatory Visit: Payer: Self-pay | Admitting: Family Medicine

## 2015-06-02 ENCOUNTER — Other Ambulatory Visit: Payer: Self-pay | Admitting: Obstetrics and Gynecology

## 2015-06-02 DIAGNOSIS — N63 Unspecified lump in unspecified breast: Secondary | ICD-10-CM

## 2015-06-03 ENCOUNTER — Encounter: Payer: Self-pay | Admitting: Family Medicine

## 2015-06-03 ENCOUNTER — Ambulatory Visit (INDEPENDENT_AMBULATORY_CARE_PROVIDER_SITE_OTHER): Payer: 59 | Admitting: Family Medicine

## 2015-06-03 DIAGNOSIS — I10 Essential (primary) hypertension: Secondary | ICD-10-CM | POA: Diagnosis not present

## 2015-06-03 DIAGNOSIS — G4700 Insomnia, unspecified: Secondary | ICD-10-CM | POA: Diagnosis not present

## 2015-06-03 MED ORDER — TRAZODONE HCL 50 MG PO TABS: ORAL_TABLET | ORAL | Status: DC

## 2015-06-03 MED ORDER — LISINOPRIL-HYDROCHLOROTHIAZIDE 10-12.5 MG PO TABS: 1.0000 | ORAL_TABLET | Freq: Every day | ORAL | Status: DC

## 2015-06-03 MED ORDER — PHENTERMINE HCL 37.5 MG PO CAPS: 37.5000 mg | ORAL_CAPSULE | Freq: Every morning | ORAL | Status: DC

## 2015-06-03 MED FILL — PHENTERMINE 37.5 MG CAPSULE: 37.5 | 30 days supply | Qty: 30 | Fill #0

## 2015-06-03 NOTE — Progress Notes (Signed)
Patient ID: Kristi Ramos, female    DOB: November 13, 1970  Age: 45 y.o. MRN: [id number]    Subjective:  Subjective HPI Kristi Ramos presents for f/u weight.  She gained 8 lbs because of wedding showers etc.     Review of Systems  Constitutional: Negative for diaphoresis, appetite change, fatigue and unexpected weight change.  Eyes: Negative for pain, redness and visual disturbance.  Respiratory: Negative for cough, chest tightness, shortness of breath and wheezing.   Cardiovascular: Negative for chest pain, palpitations and leg swelling.  Endocrine: Negative for cold intolerance, heat intolerance, polydipsia, polyphagia and polyuria.  Genitourinary: Negative for dysuria, frequency and difficulty urinating.  Neurological: Negative for dizziness, light-headedness, numbness and headaches.    History Past Medical History  Diagnosis Date  . Uterine cancer ([place]) 2010  . Cervical cancer ([place]) 2010  . Chicken pox   . Depression 2009  . GERD (gastroesophageal reflux disease)   . Seasonal allergies   . HTN (hypertension)     She has past surgical history that includes Cholecystectomy; Ankle surgery (Right); Leg Surgery; and Abdominal hysterectomy (2010).   Her family history includes Alcohol abuse in her maternal grandfather and paternal grandmother; Arthritis in her father and mother; Depression in her son; Drug abuse in her son; Heart attack in her paternal grandfather; Heart attack (age of onset: 61) in her father; Heart disease in her father and paternal grandfather; Hyperlipidemia in her father; Hypertension in her brother and mother; Stroke in her mother; Thyroid disease in her mother.She reports that she has never smoked. She has never used smokeless tobacco. She reports that she drinks alcohol. She reports that she does not use illicit drugs.  Current Outpatient Prescriptions on File Prior to Visit  Medication Sig Dispense Refill  . omeprazole (PRILOSEC) 20 MG capsule  Take 1 capsule (20 mg total) by mouth daily. 30 capsule 11   No current facility-administered medications on file prior to visit.     Objective:  Objective Physical Exam  Constitutional: She is oriented to person, place, and time. She appears well-developed and well-nourished.  HENT:  Head: Normocephalic and atraumatic.  Eyes: Conjunctivae and EOM are normal.  Neck: Normal range of motion. Neck supple. No JVD present. Carotid bruit is not present. No thyromegaly present.  Cardiovascular: Normal rate, regular rhythm and normal heart sounds.   No murmur heard. Pulmonary/Chest: Effort normal and breath sounds normal. No respiratory distress. She has no wheezes. She has no rales. She exhibits no tenderness.  Musculoskeletal: She exhibits no edema.  Neurological: She is alert and oriented to person, place, and time.  Psychiatric: She has a normal mood and affect. Her behavior is normal. Judgment and thought content normal.  Nursing note and vitals reviewed.  BP 120/74 mmHg  Pulse 85  Temp(Src) 98.1 F (36.7 C) (Oral)  Ht 5\' 3"  (1.6 m)  Wt 218 lb (98.884 kg)  BMI 38.63 kg/m2  SpO2 98% Wt Readings from Last 3 Encounters:  06/03/15 218 lb (98.884 kg)  05/05/15 210 lb 6.4 oz (95.437 kg)  04/07/15 214 lb 12.8 oz (97.433 kg)     Lab Results  Component Value Date   WBC 6.8 01/13/2015   HGB 13.3 01/13/2015   HCT 39.3 01/13/2015   PLT 304.0 01/13/2015   GLUCOSE 110* 01/13/2015   CHOL 163 01/13/2015   TRIG 74.0 01/13/2015   HDL 47.90 01/13/2015   LDLCALC 100* 01/13/2015   ALT 47* 01/13/2015   AST 28 01/13/2015   NA 136  01/13/2015   K 3.8 01/13/2015   CL 102 01/13/2015   CREATININE 0.82 01/13/2015   BUN 12 01/13/2015   CO2 25 01/13/2015   TSH 1.87 01/13/2015   HGBA1C 5.8 12/24/2013    US Transvaginal Non-ob  05/07/2015  CLINICAL DATA:  Right lower quadrant pain intermittent for 1 month. History of uterine cancer and hysterectomy EXAM: TRANSABDOMINAL AND TRANSVAGINAL  ULTRASOUND OF PELVIS TECHNIQUE: Both transabdominal and transvaginal ultrasound examinations of the pelvis were performed. Transabdominal technique was performed for global imaging of the pelvis including uterus, ovaries, adnexal regions, and pelvic cul-de-sac. It was necessary to proceed with endovaginal exam following the transabdominal exam to visualize the ovaries. COMPARISON:  None FINDINGS: Uterus Measurements: Prior hysterectomy. Endometrium Thickness: N/A. Right ovary Measurements: 3.3 x 2.9 x 2.6 cm. Small follicles. 2.3 cm complex area within the right ovary likely reflects a hemorrhagic follicle or cyst. Left ovary Measurements: 3.6 x 1.6 x 2.1 cm. Normal appearance/no adnexal mass. Other findings Trace free fluid. IMPRESSION: Small follicles including a complex follicle within the right ovary. Prior hysterectomy. No acute findings. Electronically Signed   By: Rolm Baptise M.D.   On: 05/07/2015 17:17   US Pelvis Complete  05/07/2015  CLINICAL DATA:  Right lower quadrant pain intermittent for 1 month. History of uterine cancer and hysterectomy EXAM: TRANSABDOMINAL AND TRANSVAGINAL ULTRASOUND OF PELVIS TECHNIQUE: Both transabdominal and transvaginal ultrasound examinations of the pelvis were performed. Transabdominal technique was performed for global imaging of the pelvis including uterus, ovaries, adnexal regions, and pelvic cul-de-sac. It was necessary to proceed with endovaginal exam following the transabdominal exam to visualize the ovaries. COMPARISON:  None FINDINGS: Uterus Measurements: Prior hysterectomy. Endometrium Thickness: N/A. Right ovary Measurements: 3.3 x 2.9 x 2.6 cm. Small follicles. 2.3 cm complex area within the right ovary likely reflects a hemorrhagic follicle or cyst. Left ovary Measurements: 3.6 x 1.6 x 2.1 cm. Normal appearance/no adnexal mass. Other findings Trace free fluid. IMPRESSION: Small follicles including a complex follicle within the right ovary. Prior hysterectomy.  No acute findings. Electronically Signed   By: Rolm Baptise M.D.   On: 05/07/2015 17:17     Assessment & Plan:  Plan I am having Ms. Panos maintain her omeprazole, phentermine, traZODone, and lisinopril-hydrochlorothiazide.  Meds ordered this encounter  Medications  . phentermine 37.5 MG capsule    Sig: Take 1 capsule (37.5 mg total) by mouth every morning.    Dispense:  30 capsule    Refill:  0  . traZODone (DESYREL) 50 MG tablet    Sig: TAKE 1 TO 2 TABLETS(50 TO 100 MG) BY MOUTH AT BEDTIME AS NEEDED FOR SLEEP    Dispense:  60 tablet    Refill:  3  . lisinopril-hydrochlorothiazide (PRINZIDE,ZESTORETIC) 10-12.5 MG tablet    Sig: Take 1 tablet by mouth daily.    Dispense:  30 tablet    Refill:  5    Problem List Items Addressed This Visit      Unprioritized   Morbid obesity ([place]) - Primary   Relevant Medications   phentermine 37.5 MG capsule   Insomnia   Relevant Medications   traZODone (DESYREL) 50 MG tablet    Other Visit Diagnoses    Essential hypertension        Relevant Medications    lisinopril-hydrochlorothiazide (PRINZIDE,ZESTORETIC) 10-12.5 MG tablet     pt will con't diet and exercise  Follow-up: Return in about 4 weeks (around 07/01/2015), or if symptoms worsen or fail to improve, for weight loss.  Ann Held, DO

## 2015-06-03 NOTE — Patient Instructions (Addendum)

## 2015-06-03 NOTE — Progress Notes (Signed)
Pre visit review using our clinic review tool, if applicable. No additional management support is needed unless otherwise documented below in the visit note. 

## 2015-06-16 ENCOUNTER — Ambulatory Visit
Admission: RE | Admit: 2015-06-16 | Discharge: 2015-06-16 | Disposition: A | Discharge status: Home / Self Care | Payer: 59 | Source: Ambulatory Visit | Attending: Obstetrics and Gynecology | Admitting: Obstetrics and Gynecology

## 2015-06-16 DIAGNOSIS — N63 Unspecified lump in unspecified breast: Secondary | ICD-10-CM

## 2015-06-21 ENCOUNTER — Encounter: Payer: Self-pay | Admitting: Family Medicine

## 2015-06-21 ENCOUNTER — Ambulatory Visit (INDEPENDENT_AMBULATORY_CARE_PROVIDER_SITE_OTHER): Payer: 59 | Admitting: Family Medicine

## 2015-06-21 VITALS — BP 126/74 | HR 83 | Temp 98.4°F | Ht 63.0 in | Wt 218.0 lb

## 2015-06-21 DIAGNOSIS — L918 Other hypertrophic disorders of the skin: Secondary | ICD-10-CM

## 2015-06-21 NOTE — Patient Instructions (Signed)
Keep dry 24 h--- then you can shower as usual

## 2015-06-21 NOTE — Progress Notes (Signed)
  Skin Tag Removal Procedure Note  Pre-operative Diagnosis: Classic skin tags (acrochordon)  Post-operative Diagnosis: Classic skin tags (acrochordon)  Locations: 1 in both axillas, L side of neck and R side chest   Indications: irritated and red  Anesthesia: Lidocaine 1% with epinephrine without added sodium bicarbonate  Procedure Details  The risks (including bleeding and infection) and benefits of the procedure and Written informed consent obtained. Using sterile iris scissors, multiple skin tags were snipped off at their bases after cleansing with Betadine.  Bleeding was controlled by pressure and silver nitrate stick.     Findings: Pathognomonic benign lesions  not sent for pathological exam.  Condition: Stable  Complications: none.  Plan: 1. Instructed to keep the wounds dry and covered for 24-48h and clean thereafter. 2. Warning signs of infection were reviewed.   3. Recommended that the patient use OTC acetaminophen as needed for pain.  4. Return as needed.

## 2015-06-21 NOTE — Progress Notes (Signed)
Pre visit review using our clinic review tool, if applicable. No additional management support is needed unless otherwise documented below in the visit note. 

## 2015-07-01 ENCOUNTER — Ambulatory Visit (INDEPENDENT_AMBULATORY_CARE_PROVIDER_SITE_OTHER): Payer: 59 | Admitting: Family Medicine

## 2015-07-01 ENCOUNTER — Encounter: Payer: Self-pay | Admitting: Family Medicine

## 2015-07-01 MED ORDER — PHENTERMINE HCL 37.5 MG PO CAPS: 37.5000 mg | ORAL_CAPSULE | Freq: Every morning | ORAL | Status: DC

## 2015-07-01 MED FILL — PHENTERMINE 37.5 MG CAPSULE: 37.5 | 30 days supply | Qty: 30 | Fill #0

## 2015-07-01 NOTE — Progress Notes (Signed)
Patient ID: Kristi Ramos, female    DOB: [birth year]  Age: 45 y.o. MRN: [id number]    Subjective:  Subjective HPI Kristi Ramos presents for weight check.  Pt has had a stressful month.  She plans to get better with diet and exercise.    Review of Systems  Constitutional: Negative for diaphoresis, appetite change, fatigue and unexpected weight change.  Eyes: Negative for pain, redness and visual disturbance.  Respiratory: Negative for cough, chest tightness, shortness of breath and wheezing.   Cardiovascular: Negative for chest pain, palpitations and leg swelling.  Endocrine: Negative for cold intolerance, heat intolerance, polydipsia, polyphagia and polyuria.  Genitourinary: Negative for dysuria, frequency and difficulty urinating.  Neurological: Negative for dizziness, light-headedness, numbness and headaches.    History Past Medical History  Diagnosis Date  . Uterine cancer ([place]) 2010  . Cervical cancer ([place]) 2010  . Chicken pox   . Depression 2009  . GERD (gastroesophageal reflux disease)   . Seasonal allergies   . HTN (hypertension)     She has past surgical history that includes Cholecystectomy; Ankle surgery (Right); Leg Surgery; and Abdominal hysterectomy (2010).   Her family history includes Alcohol abuse in her maternal grandfather and paternal grandmother; Arthritis in her father and mother; Depression in her son; Drug abuse in her son; Heart attack in her paternal grandfather; Heart attack (age of onset: 64) in her father; Heart disease in her father and paternal grandfather; Hyperlipidemia in her father; Hypertension in her brother and mother; Stroke in her mother; Thyroid disease in her mother.She reports that she has never smoked. She has never used smokeless tobacco. She reports that she drinks alcohol. She reports that she does not use illicit drugs.  Current Outpatient Prescriptions on File Prior to Visit  Medication Sig Dispense Refill  .  lisinopril-hydrochlorothiazide (PRINZIDE,ZESTORETIC) 10-12.5 MG tablet Take 1 tablet by mouth daily. 30 tablet 5  . omeprazole (PRILOSEC) 20 MG capsule Take 1 capsule (20 mg total) by mouth daily. 30 capsule 11  . traZODone (DESYREL) 50 MG tablet TAKE 1 TO 2 TABLETS(50 TO 100 MG) BY MOUTH AT BEDTIME AS NEEDED FOR SLEEP 60 tablet 3   No current facility-administered medications on file prior to visit.     Objective:  Objective Physical Exam  Constitutional: She is oriented to person, place, and time. She appears well-developed and well-nourished.  HENT:  Head: Normocephalic and atraumatic.  Eyes: Conjunctivae and EOM are normal.  Neck: Normal range of motion. Neck supple. No JVD present. Carotid bruit is not present. No thyromegaly present.  Cardiovascular: Normal rate, regular rhythm and normal heart sounds.   No murmur heard. Pulmonary/Chest: Effort normal and breath sounds normal. No respiratory distress. She has no wheezes. She has no rales. She exhibits no tenderness.  Musculoskeletal: She exhibits no edema.  Neurological: She is alert and oriented to person, place, and time.  Psychiatric: She has a normal mood and affect. Her behavior is normal.  Nursing note and vitals reviewed.  BP 132/84 mmHg  Pulse 76  Temp(Src) 98.1 F (36.7 C) (Oral)  Ht 5\' 3"  (1.6 m)  Wt 218 lb (98.884 kg)  BMI 38.63 kg/m2  SpO2 98% Wt Readings from Last 3 Encounters:  07/01/15 218 lb (98.884 kg)  06/21/15 218 lb (98.884 kg)  06/03/15 218 lb (98.884 kg)     Lab Results  Component Value Date   WBC 6.8 01/13/2015   HGB 13.3 01/13/2015   HCT 39.3 01/13/2015   PLT 304.0 01/13/2015  GLUCOSE 110* 01/13/2015   CHOL 163 01/13/2015   TRIG 74.0 01/13/2015   HDL 47.90 01/13/2015   LDLCALC 100* 01/13/2015   ALT 47* 01/13/2015   AST 28 01/13/2015   NA 136 01/13/2015   K 3.8 01/13/2015   CL 102 01/13/2015   CREATININE 0.82 01/13/2015   BUN 12 01/13/2015   CO2 25 01/13/2015   TSH 1.87 01/13/2015    HGBA1C 5.8 12/24/2013    Mm Diag Breast Tomo Uni Left  06/16/2015  CLINICAL DATA:  Six-month follow-up for a probably benign left breast asymmetry. EXAM: 2D DIGITAL DIAGNOSTIC UNILATERAL LEFT MAMMOGRAM WITH CAD AND ADJUNCT TOMO COMPARISON:  Previous exam(s). ACR Breast Density Category b: There are scattered areas of fibroglandular density. FINDINGS: The small asymmetry in the medial far posterior left breast is stable. No suspicious calcifications, masses or areas of distortion are seen in the left breast. Mammographic images were processed with CAD. IMPRESSION: The asymmetry in the medial far posterior left breast is stable. RECOMMENDATION: Six-month follow-up bilateral diagnostic mammogram with possible left breast ultrasound is recommended to ensure stability of the small probably benign asymmetry. I have discussed the findings and recommendations with the patient. Results were also provided in writing at the conclusion of the visit. If applicable, a reminder letter will be sent to the patient regarding the next appointment. BI-RADS CATEGORY  3: Probably benign. Electronically Signed   By: Ammie Ferrier M.D.   On: 06/16/2015 11:58     Assessment & Plan:  Plan I am having Ms. Tomerlin maintain her omeprazole, traZODone, lisinopril-hydrochlorothiazide, and phentermine.  Meds ordered this encounter  Medications  . phentermine 37.5 MG capsule    Sig: Take 1 capsule (37.5 mg total) by mouth every morning.    Dispense:  30 capsule    Refill:  0    Problem List Items Addressed This Visit      Unprioritized   Morbid obesity ([place]) - Primary   Relevant Medications   phentermine 37.5 MG capsule    pt to get back on her diet and exercise rto 1 month  Follow-up: Return in about 4 weeks (around 07/29/2015).  Ann Held, DO     +

## 2015-07-01 NOTE — Progress Notes (Signed)
Pre visit review using our clinic review tool, if applicable. No additional management support is needed unless otherwise documented below in the visit note. 

## 2015-07-08 ENCOUNTER — Telehealth: Payer: Self-pay

## 2015-07-08 NOTE — Telephone Encounter (Signed)
Patient called to notify us that due to insurance changes she will be having [facility] handle her medication and will need 90 supplies going forward for qualified medication just an fyi

## 2015-07-11 ENCOUNTER — Other Ambulatory Visit: Payer: Self-pay | Admitting: *Deleted

## 2015-07-11 DIAGNOSIS — K219 Gastro-esophageal reflux disease without esophagitis: Secondary | ICD-10-CM

## 2015-07-11 DIAGNOSIS — G4700 Insomnia, unspecified: Secondary | ICD-10-CM

## 2015-07-11 DIAGNOSIS — I10 Essential (primary) hypertension: Secondary | ICD-10-CM

## 2015-07-12 MED ORDER — TRAZODONE HCL 50 MG PO TABS: ORAL_TABLET | ORAL | Status: DC

## 2015-07-12 MED ORDER — LISINOPRIL-HYDROCHLOROTHIAZIDE 10-12.5 MG PO TABS: 1.0000 | ORAL_TABLET | Freq: Every day | ORAL | Status: DC

## 2015-07-12 MED ORDER — OMEPRAZOLE 20 MG PO CPDR: 20.0000 mg | DELAYED_RELEASE_CAPSULE | Freq: Every day | ORAL | Status: DC

## 2015-07-12 NOTE — Telephone Encounter (Signed)
Rx's approved and sent to the pharmacy by e-script.//AB/CMA 

## 2015-07-21 ENCOUNTER — Ambulatory Visit (INDEPENDENT_AMBULATORY_CARE_PROVIDER_SITE_OTHER): Payer: 59 | Admitting: Medical

## 2015-07-21 ENCOUNTER — Encounter: Payer: Self-pay | Admitting: Medical

## 2015-07-21 VITALS — BP 120/78 | HR 76 | Temp 98.1°F | Ht 63.0 in | Wt 218.0 lb

## 2015-07-21 DIAGNOSIS — T7840XA Allergy, unspecified, initial encounter: Secondary | ICD-10-CM

## 2015-07-21 MED ORDER — PREDNISONE 10 MG PO TABS: ORAL_TABLET | ORAL | Status: DC

## 2015-07-21 MED ORDER — METHYLPREDNISOLONE ACETATE 40 MG/ML IJ SUSP
40.0000 mg | Freq: Once | INTRAMUSCULAR | Status: AC
  Administered 2015-07-21: 40 mg via INTRAMUSCULAR

## 2015-07-21 MED ORDER — TRIAMCINOLONE ACETONIDE 0.1 % EX CREA: 1.0000 "application " | TOPICAL_CREAM | Freq: Two times a day (BID) | CUTANEOUS | Status: DC

## 2015-07-21 MED ORDER — HYDROXYZINE HCL 25 MG PO TABS: 25.0000 mg | ORAL_TABLET | Freq: Three times a day (TID) | ORAL | Status: DC | PRN

## 2015-07-21 NOTE — Addendum Note (Signed)
Addended by: Tasia Catchings on: 07/21/2015 03:10 PM   Modules accepted: Orders

## 2015-07-21 NOTE — Progress Notes (Signed)
Subjective:    Patient ID: Kristi Ramos, female    DOB: [birth year], 45 y.o.   MRN: [id number]  HPI  Pt has recent rash that itches. Reports rash occurred after direct exposure to poison ivy or oak about 10  days ago. Pt rash is in the area of arms, buttox, forearms. rt side of neck and buttox Pt reports no shortness of breath or wheezing. Reports history of known reaction to poison ivy. Pt is not diabetic. Pt used some steroid cream from her dentist borrowed.  LMP- hysterectomy.   Review of Systems  Constitutional: Negative for fever, chills and fatigue.  Respiratory: Negative for cough, chest tightness, shortness of breath and wheezing.   Cardiovascular: Negative for chest pain and palpitations.  Gastrointestinal: Negative for abdominal pain.  Skin: Positive for rash.  Hematological: Negative for adenopathy. Does not bruise/bleed easily.    Past Medical History  Diagnosis Date  . Uterine cancer ([place]) 2010  . Cervical cancer ([place]) 2010  . Chicken pox   . Depression 2009  . GERD (gastroesophageal reflux disease)   . Seasonal allergies   . HTN (hypertension)      Social History   Social History  . Marital Status: Divorced    Spouse Name: N/A  . Number of Children: N/A  . Years of Education: N/A   Occupational History  .      dental hygiene   Social History Main Topics  . Smoking status: Never Smoker   . Smokeless tobacco: Never Used  . Alcohol Use: Yes     Comment: Occ  . Drug Use: No  . Sexual Activity:    Partners: Male   Other Topics Concern  . Not on file   Social History Narrative   Exercise--- just started back    Past Surgical History  Procedure Laterality Date  . Cholecystectomy    . Ankle surgery Right     Osteomyelitis  . Leg surgery      Calf Muscle Surgery  . Abdominal hysterectomy  2010    TAH-- cancer    Family History  Problem Relation Age of Onset  . Arthritis Mother   . Stroke Mother   . Hypertension Mother   .  Thyroid disease Mother   . Arthritis Father   . Hyperlipidemia Father   . Heart disease Father   . Heart attack Father 61  . Drug abuse Son   . Alcohol abuse Paternal Grandmother   . Alcohol abuse Maternal Grandfather   . Heart disease Paternal Grandfather   . Heart attack Paternal Grandfather   . Depression Son   . Hypertension Brother     Allergies  Allergen Reactions  . Moxifloxacin Rash    Current Outpatient Prescriptions on File Prior to Visit  Medication Sig Dispense Refill  . lisinopril-hydrochlorothiazide (PRINZIDE,ZESTORETIC) 10-12.5 MG tablet Take 1 tablet by mouth daily. 90 tablet 1  . omeprazole (PRILOSEC) 20 MG capsule Take 1 capsule (20 mg total) by mouth daily. 90 capsule 1  . phentermine 37.5 MG capsule Take 1 capsule (37.5 mg total) by mouth every morning. 30 capsule 0  . traZODone (DESYREL) 50 MG tablet TAKE 1 TO 2 TABLETS(50 TO 100 MG) BY MOUTH AT BEDTIME AS NEEDED FOR SLEEP 90 tablet 0   No current facility-administered medications on file prior to visit.    BP 120/78 mmHg  Pulse 76  Temp(Src) 98.1 F (36.7 C) (Oral)  Ht 5\' 3"  (1.6 m)  Wt 218 lb (98.884 kg)  BMI 38.63 kg/m2  SpO2 98%       Objective:   Physical Exam  General- No acute distress. Pleasant patient. Neck- Full range of motion, no jvd Lungs- Clear, even and unlabored. Heart- regular rate and rhythm. Neurologic- CNII- XII grossly intact.  Derm- scattered papular rash rt side of the neck. Both forearms mild papules present. Abdomen faint papules. buttox- rt side dry area(where she applied homephathic cream)        Assessment & Plan:  You recently have allergic reaction to poison ivy. We gave you depo-medrol im injection. I am also prescribing oral prednisone and hydroxyzine for itching. Your rash should gradually improve. If worsening or expanding please notify us.  You requested topical steroid and making available. Also you want to not take oral prednisone. Will make this  available to start if depomedrol not adequate.   Follow up in 7 days or as needed.  Shireen Rayburn, Percell Miller, PA-C

## 2015-07-21 NOTE — Patient Instructions (Signed)
You recently have allergic reaction to poison ivy. We gave you depo-medrol im injection. I am also prescribing oral prednisone and hydroxyzine for itching. Your rash should gradually improve. If worsening or expanding please notify us.  You requested topical steroid and making available. Also you want to not take oral prednisone. Will make this available to start if depomedrol not adequate.   Follow up in 7 days or as needed.

## 2015-07-21 NOTE — Progress Notes (Signed)
Pre visit review using our clinic review tool, if applicable. No additional management support is needed unless otherwise documented below in the visit note. 

## 2015-08-05 ENCOUNTER — Encounter: Payer: Self-pay | Admitting: Family Medicine

## 2015-08-05 ENCOUNTER — Ambulatory Visit (INDEPENDENT_AMBULATORY_CARE_PROVIDER_SITE_OTHER): Payer: 59 | Admitting: Family Medicine

## 2015-08-05 VITALS — BP 118/78 | HR 90 | Temp 97.9°F | Ht 63.0 in | Wt 218.2 lb

## 2015-08-05 DIAGNOSIS — A499 Bacterial infection, unspecified: Secondary | ICD-10-CM

## 2015-08-05 DIAGNOSIS — J329 Chronic sinusitis, unspecified: Secondary | ICD-10-CM

## 2015-08-05 DIAGNOSIS — B9689 Other specified bacterial agents as the cause of diseases classified elsewhere: Secondary | ICD-10-CM

## 2015-08-05 DIAGNOSIS — L259 Unspecified contact dermatitis, unspecified cause: Secondary | ICD-10-CM

## 2015-08-05 MED ORDER — FLUTICASONE PROPIONATE 50 MCG/ACT NA SUSP
2.0000 | Freq: Every day | NASAL | Status: DC
Start: 2015-08-05 — End: 2016-08-23

## 2015-08-05 MED ORDER — TRIAMCINOLONE ACETONIDE 0.025 % EX CREA: 1.0000 "application " | TOPICAL_CREAM | Freq: Two times a day (BID) | CUTANEOUS | Status: DC

## 2015-08-05 MED ORDER — FLUCONAZOLE 150 MG PO TABS: ORAL_TABLET | ORAL | Status: DC

## 2015-08-05 MED ORDER — AMOXICILLIN-POT CLAVULANATE 875-125 MG PO TABS: 1.0000 | ORAL_TABLET | Freq: Two times a day (BID) | ORAL | Status: DC

## 2015-08-05 NOTE — Progress Notes (Signed)
Pre visit review using our clinic review tool, if applicable. No additional management support is needed unless otherwise documented below in the visit note. 

## 2015-08-05 NOTE — Progress Notes (Signed)
++Patient ID: Kristi Ramos, female    DOB: 01-Jun-1970  Age: 45 y.o. MRN: [id number]    Subjective:  Subjective HPI Kristi Ramos presents for f/u contact dermatitis -- she still has some itchy spots.  Pt also c/o sinus congestion x 10 days.  She has been using antihistamine, decongestants and mucinex with no relief.  + sinus pressure , she feels hot but has had no fever.    Review of Systems  Constitutional: Positive for chills. Negative for fever, diaphoresis, appetite change, fatigue and unexpected weight change.  HENT: Positive for congestion, postnasal drip, rhinorrhea and sinus pressure.   Eyes: Negative for pain, redness and visual disturbance.  Respiratory: Negative for cough, chest tightness, shortness of breath and wheezing.   Cardiovascular: Negative for chest pain, palpitations and leg swelling.  Endocrine: Negative for cold intolerance, heat intolerance, polydipsia, polyphagia and polyuria.  Genitourinary: Negative for dysuria, frequency and difficulty urinating.  Allergic/Immunologic: Negative for environmental allergies.  Neurological: Negative for dizziness, light-headedness, numbness and headaches.  Hematological: Positive for adenopathy.    History Past Medical History  Diagnosis Date  . Uterine cancer ([place]) 2010  . Cervical cancer ([place]) 2010  . Chicken pox   . Depression 2009  . GERD (gastroesophageal reflux disease)   . Seasonal allergies   . HTN (hypertension)     She has past surgical history that includes Cholecystectomy; Ankle surgery (Right); Leg Surgery; and Abdominal hysterectomy (2010).   Her family history includes Alcohol abuse in her maternal grandfather and paternal grandmother; Arthritis in her father and mother; Depression in her son; Drug abuse in her son; Heart attack in her paternal grandfather; Heart attack (age of onset: 81) in her father; Heart disease in her father and paternal grandfather; Hyperlipidemia in her father;  Hypertension in her brother and mother; Stroke in her mother; Thyroid disease in her mother.She reports that she has never smoked. She has never used smokeless tobacco. She reports that she drinks alcohol. She reports that she does not use illicit drugs.  Current Outpatient Prescriptions on File Prior to Visit  Medication Sig Dispense Refill  . cetirizine (ZYRTEC) 10 MG tablet Take 10 mg by mouth daily.    Marland Kitchen lisinopril-hydrochlorothiazide (PRINZIDE,ZESTORETIC) 10-12.5 MG tablet Take 1 tablet by mouth daily. 90 tablet 1  . omeprazole (PRILOSEC) 20 MG capsule Take 1 capsule (20 mg total) by mouth daily. 90 capsule 1  . traZODone (DESYREL) 50 MG tablet TAKE 1 TO 2 TABLETS(50 TO 100 MG) BY MOUTH AT BEDTIME AS NEEDED FOR SLEEP 90 tablet 0   No current facility-administered medications on file prior to visit.     Objective:  Objective Physical Exam  Constitutional: She is oriented to person, place, and time. She appears well-developed and well-nourished.  HENT:  Head: Normocephalic and atraumatic.  Right Ear: External ear normal. Tympanic membrane is bulging.  Left Ear: External ear normal. Tympanic membrane is bulging.  Nose: Right sinus exhibits maxillary sinus tenderness and frontal sinus tenderness. Left sinus exhibits maxillary sinus tenderness and frontal sinus tenderness.  Mouth/Throat: Posterior oropharyngeal erythema present.  + PND + errythema  Eyes: Conjunctivae and EOM are normal. Right eye exhibits no discharge. Left eye exhibits no discharge.  Neck: Normal range of motion. Neck supple. No JVD present. Carotid bruit is not present. No thyromegaly present.  Cardiovascular: Normal rate, regular rhythm and normal heart sounds.   No murmur heard. Pulmonary/Chest: Effort normal and breath sounds normal. No respiratory distress. She has no wheezes. She has  no rales. She exhibits no tenderness.  Musculoskeletal: She exhibits no edema.  Lymphadenopathy:    She has cervical adenopathy.    Neurological: She is alert and oriented to person, place, and time.  Skin: Rash noted. Rash is vesicular.     Psychiatric: She has a normal mood and affect. Her behavior is normal. Judgment and thought content normal.  Nursing note and vitals reviewed.  BP 118/78 mmHg  Pulse 90  Temp(Src) 97.9 F (36.6 C) (Oral)  Ht 5\' 3"  (1.6 m)  Wt 218 lb 3.2 oz (98.975 kg)  BMI 38.66 kg/m2  SpO2 99% Wt Readings from Last 3 Encounters:  08/05/15 218 lb 3.2 oz (98.975 kg)  07/21/15 218 lb (98.884 kg)  07/01/15 218 lb (98.884 kg)     Lab Results  Component Value Date   WBC 6.8 01/13/2015   HGB 13.3 01/13/2015   HCT 39.3 01/13/2015   PLT 304.0 01/13/2015   GLUCOSE 110* 01/13/2015   CHOL 163 01/13/2015   TRIG 74.0 01/13/2015   HDL 47.90 01/13/2015   LDLCALC 100* 01/13/2015   ALT 47* 01/13/2015   AST 28 01/13/2015   NA 136 01/13/2015   K 3.8 01/13/2015   CL 102 01/13/2015   CREATININE 0.82 01/13/2015   BUN 12 01/13/2015   CO2 25 01/13/2015   TSH 1.87 01/13/2015   HGBA1C 5.8 12/24/2013    Mm Diag Breast Tomo Uni Left  06/16/2015  CLINICAL DATA:  Six-month follow-up for a probably benign left breast asymmetry. EXAM: 2D DIGITAL DIAGNOSTIC UNILATERAL LEFT MAMMOGRAM WITH CAD AND ADJUNCT TOMO COMPARISON:  Previous exam(s). ACR Breast Density Category b: There are scattered areas of fibroglandular density. FINDINGS: The small asymmetry in the medial far posterior left breast is stable. No suspicious calcifications, masses or areas of distortion are seen in the left breast. Mammographic images were processed with CAD. IMPRESSION: The asymmetry in the medial far posterior left breast is stable. RECOMMENDATION: Six-month follow-up bilateral diagnostic mammogram with possible left breast ultrasound is recommended to ensure stability of the small probably benign asymmetry. I have discussed the findings and recommendations with the patient. Results were also provided in writing at the conclusion of  the visit. If applicable, a reminder letter will be sent to the patient regarding the next appointment. BI-RADS CATEGORY  3: Probably benign. Electronically Signed   By: Ammie Ferrier M.D.   On: 06/16/2015 11:58     Assessment & Plan:  Plan I have discontinued Kristi Ramos's phentermine, predniSONE, hydrOXYzine, and triamcinolone cream. I am also having her start on triamcinolone, amoxicillin-clavulanate, fluconazole, and fluticasone. Additionally, I am having her maintain her omeprazole, lisinopril-hydrochlorothiazide, traZODone, and cetirizine.  Meds ordered this encounter  Medications  . triamcinolone (KENALOG) 0.025 % cream    Sig: Apply 1 application topically 2 (two) times daily.    Dispense:  30 g    Refill:  0  . amoxicillin-clavulanate (AUGMENTIN) 875-125 MG tablet    Sig: Take 1 tablet by mouth 2 (two) times daily.    Dispense:  20 tablet    Refill:  0  . fluconazole (DIFLUCAN) 150 MG tablet    Sig: 1 po q d x 1 , may repeat in 3 days prn    Dispense:  2 tablet    Refill:  0  . fluticasone (FLONASE) 50 MCG/ACT nasal spray    Sig: Place 2 sprays into both nostrils daily.    Dispense:  16 g    Refill:  6  Problem List Items Addressed This Visit    None    Visit Diagnoses    Contact dermatitis    -  Primary    Relevant Medications    triamcinolone (KENALOG) 0.025 % cream    Sinusitis, bacterial        Relevant Medications    amoxicillin-clavulanate (AUGMENTIN) 875-125 MG tablet    fluconazole (DIFLUCAN) 150 MG tablet    fluticasone (FLONASE) 50 MCG/ACT nasal spray       Follow-up: Return if symptoms worsen or fail to improve.  Ann Held, DO

## 2015-08-05 NOTE — Patient Instructions (Signed)

## 2015-08-08 ENCOUNTER — Telehealth: Payer: Self-pay | Admitting: Family Medicine

## 2015-08-08 NOTE — Telephone Encounter (Signed)
Relationship to patient: self Can be reached: [phone] or work # Pharmacy: [facility] [place] [place]  Reason for call: pt states the augmentin gave her tachycardia. She said the same thing happens with avelox. She is asking that another med be sent in. She has not taken today. Yesterday she held flonase to make sure that wasn't the cause and she still have the same problem.

## 2015-08-09 MED ORDER — CLARITHROMYCIN ER 500 MG PO TB24: 1000.0000 mg | ORAL_TABLET | Freq: Every day | ORAL | Status: DC

## 2015-08-09 NOTE — Telephone Encounter (Signed)
Notified pt and she voices understanding.  Rx sent. Allergy list updated.

## 2015-08-09 NOTE — Telephone Encounter (Signed)
Patient called back to follow up on getting another antibiotic. Plse adv.

## 2015-08-09 NOTE — Telephone Encounter (Signed)
D/c augmentin biaxin xl 2 po qd x 10 days

## 2015-09-01 ENCOUNTER — Telehealth: Payer: Self-pay | Admitting: Family Medicine

## 2015-09-01 DIAGNOSIS — B9689 Other specified bacterial agents as the cause of diseases classified elsewhere: Secondary | ICD-10-CM

## 2015-09-01 DIAGNOSIS — J329 Chronic sinusitis, unspecified: Principal | ICD-10-CM

## 2015-09-01 MED ORDER — FLUCONAZOLE 150 MG PO TABS: ORAL_TABLET | ORAL | Status: DC

## 2015-09-01 NOTE — Telephone Encounter (Signed)
The 08/05/15 Rx has been re-faxed.    KP

## 2015-09-01 NOTE — Telephone Encounter (Signed)
°  Relationship to patient: Self  Can be reached: [phone]    Pharmacy:  [facility] [place] [phone] (Phone) [phone] (Fax)        Reason for call: Patient is requesting a Diflucan because she has been on antibiotics for 2 weeks.

## 2015-09-16 ENCOUNTER — Ambulatory Visit (INDEPENDENT_AMBULATORY_CARE_PROVIDER_SITE_OTHER): Payer: 59 | Admitting: Medical

## 2015-09-16 ENCOUNTER — Other Ambulatory Visit (HOSPITAL_COMMUNITY)
Admission: RE | Admit: 2015-09-16 | Discharge: 2015-09-16 | Disposition: A | Discharge status: Home / Self Care | Payer: 59 | Source: Ambulatory Visit | Attending: Medical | Admitting: Medical

## 2015-09-16 ENCOUNTER — Encounter: Payer: Self-pay | Admitting: Medical

## 2015-09-16 VITALS — BP 116/78 | HR 88 | Temp 98.1°F | Ht 63.0 in | Wt 218.0 lb

## 2015-09-16 DIAGNOSIS — R102 Pelvic and perineal pain: Secondary | ICD-10-CM | POA: Diagnosis not present

## 2015-09-16 DIAGNOSIS — N898 Other specified noninflammatory disorders of vagina: Secondary | ICD-10-CM

## 2015-09-16 DIAGNOSIS — Z113 Encounter for screening for infections with a predominantly sexual mode of transmission: Secondary | ICD-10-CM | POA: Insufficient documentation

## 2015-09-16 DIAGNOSIS — N760 Acute vaginitis: Secondary | ICD-10-CM | POA: Insufficient documentation

## 2015-09-16 LAB — POC URINALSYSI DIPSTICK (AUTOMATED)
Bilirubin, UA: NEGATIVE
Glucose, UA: NEGATIVE
Ketones, UA: NEGATIVE
Leukocytes, UA: NEGATIVE
NITRITE UA: NEGATIVE
PH UA: 6
PROTEIN UA: NEGATIVE
RBC UA: NEGATIVE
UROBILINOGEN UA: 0.2

## 2015-09-16 MED ORDER — FLUCONAZOLE 150 MG PO TABS: ORAL_TABLET | ORAL | Status: DC

## 2015-09-16 NOTE — Progress Notes (Signed)
Pre visit review using our clinic review tool, if applicable. No additional management support is needed unless otherwise documented below in the visit note. 

## 2015-09-16 NOTE — Patient Instructions (Addendum)
For your vaginal symptoms will treat you for yeast infection based on exam findings. We are doing urine ancillary studies and urine culture. Hsv antibody studies as well. Will rx diflucan for 3 days  course  If your labs area all negative and your don't respond to treatment the will need to refer back to gyn based on your history.  Follow up in 7-10 days or as needed

## 2015-09-16 NOTE — Progress Notes (Signed)
Subjective:    Patient ID: Kristi Ramos, female    DOB: 1970/06/12, 45 y.o.   MRN: [id number]  HPI   Pt in today reporting urinary symptoms in may and June.  Pt states her pcp checked and urine was ok.  She states she was called in diflucan after a sinus infection.   Pt current symptoms are subjective swelling in vaginal area. Hx of cervical cancer that spread to uterus. She had partial hysterectomy at 45 yo. Pt has been followed by gynecologis for 3-5 years. No gettin hpap with Dr. Etter Sjogren every year. Pap 12-2014 was normal.   Pt feels source of vagina infection.  Dysuria- No. Frequent urination- no Hesitancy-no Suprapubic pressure-no Fever-no  chills-no  Nausea-no Vomiting-no CVA pain-no  Maybe some rt cva pain. But also had to pick up her dog the other day. History of UTI- Gross hematuria-no  Pt gets occasional ovarian cyst. She had US done in May.    Review of Systems  Constitutional: Negative for fever, chills and fatigue.  Respiratory: Negative for cough, chest tightness, shortness of breath and wheezing.   Cardiovascular: Negative for chest pain and palpitations.  Genitourinary: Negative for dysuria, urgency, frequency, hematuria, flank pain, difficulty urinating, genital sores and vaginal pain.       Vaginal irritation.  Musculoskeletal: Negative for back pain.  Skin: Negative for rash.  Neurological: Negative for dizziness and headaches.  Hematological: Negative for adenopathy. Does not bruise/bleed easily.  Psychiatric/Behavioral: Negative for behavioral problems and confusion.    Past Medical History  Diagnosis Date  . Uterine cancer ([place]) 2010  . Cervical cancer ([place]) 2010  . Chicken pox   . Depression 2009  . GERD (gastroesophageal reflux disease)   . Seasonal allergies   . HTN (hypertension)      Social History   Social History  . Marital Status: Divorced    Spouse Name: N/A  . Number of Children: N/A  . 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 Smoking status: Never Smoker   . Smokeless tobacco: Never Used  . Alcohol Use: Yes     Comment: Occ  . Drug Use: No  . Sexual Activity:    Partners: Male   Other Topics Concern  . Not on file   Social History Narrative   Exercise--- just started back    Past Surgical History  Procedure Laterality Date  . Cholecystectomy    . Ankle surgery Right     Osteomyelitis  . Leg surgery      Calf Muscle Surgery  . Abdominal hysterectomy  2010    TAH-- cancer    Family History  Problem Relation Age of Onset  . Arthritis Mother   . Stroke Mother   . Hypertension Mother   . Thyroid disease Mother   . Arthritis Father   . Hyperlipidemia Father   . Heart disease Father   . Heart attack Father 54  . Drug abuse Son   . Alcohol abuse Paternal Grandmother   . Alcohol abuse Maternal Grandfather   . Heart disease Paternal Grandfather   . Heart attack Paternal Grandfather   . Depression Son   . Hypertension Brother     Allergies  Allergen Reactions  . Augmentin [Amoxicillin-Pot Clavulanate] Other (See Comments)    tachycardia  . Moxifloxacin Rash    Current Outpatient Prescriptions on File Prior to Visit  Medication  Sig Dispense Refill  . cetirizine (ZYRTEC) 10 MG tablet Take 10 mg by mouth daily.    . fluticasone (FLONASE) 50 MCG/ACT nasal spray Place 2 sprays into both nostrils daily. 16 g 6  . lisinopril-hydrochlorothiazide (PRINZIDE,ZESTORETIC) 10-12.5 MG tablet Take 1 tablet by mouth daily. 90 tablet 1  . omeprazole (PRILOSEC) 20 MG capsule Take 1 capsule (20 mg total) by mouth daily. 90 capsule 1  . traZODone (DESYREL) 50 MG tablet TAKE 1 TO 2 TABLETS(50 TO 100 MG) BY MOUTH AT BEDTIME AS NEEDED FOR SLEEP 90 tablet 0  . triamcinolone (KENALOG) 0.025 % cream Apply 1 application topically 2 (two) times daily. 30 g 0   No current facility-administered medications on file prior to visit.     BP 116/78 mmHg  Pulse 88  Temp(Src) 98.1 F (36.7 C) (Oral)  Ht 5\' 3"  (1.6 m)  Wt 218 lb (98.884 kg)  BMI 38.63 kg/m2  SpO2 98%       Objective:   Physical Exam  General Appearance- Not in acute distress.  HEENT Eyes- Scleraeral/Conjuntiva-bilat- Not Yellow. Mouth & Throat- Normal.  Chest and Lung Exam Auscultation: Breath sounds:-Normal. Adventitious sounds:- No Adventitious sounds.  Cardiovascular Auscultation:Rythm - Regular. Heart Sounds -Normal heart sounds.  Abdomen Inspection:-Inspection Normal.  Palpation/Perucssion: Palpation and Percussion of the abdomen reveal- Non Tender, No Rebound tenderness, No rigidity(Guarding) and No Palpable abdominal masses.  Liver:-Normal.  Spleen:- Normal.   Back- no cva tenderness.  Pelvic- Vaginal External: Labia majora and minora normal/no lesions. Pelvic/Bimanual exam: vaginal wall mild bright red.  White dc back of vaginal vault.          Assessment & Plan:  For your vaginal symptoms will treat you for yeast infection based on exam findings. We are doing urine ancillary studies and urine culture. Hsv antibody studies as well. Will rx diflucan for 3 day course.  If your labs area all negative and your don't respond to treatment the will need to refer back to gyn based on your history.  Follow up in 7-10 days or as needed

## 2015-09-17 LAB — URINE CULTURE

## 2015-09-19 LAB — HSV(HERPES SMPLX)ABS-I+II(IGG+IGM)-BLD
HERPES SIMPLEX VRS I-IGM AB (EIA): 0.74 {index}
HSV 1 Glycoprotein G Ab, IgG: <0.9 Index (ref <0.90)
HSV 2 Glycoprotein G Ab, IgG: <0.9 Index (ref <0.90)

## 2015-09-20 LAB — URINE CYTOLOGY ANCILLARY ONLY
CHLAMYDIA, DNA PROBE: NEGATIVE
Neisseria Gonorrhea: NEGATIVE
Trichomonas: NEGATIVE

## 2015-09-20 NOTE — Progress Notes (Signed)
Pt has seen results on MyChart and message also sent for patient to call back if any questions.

## 2015-09-21 LAB — URINE CYTOLOGY ANCILLARY ONLY
BACTERIAL VAGINITIS: POSITIVE — AB
Candida vaginitis: NEGATIVE

## 2015-09-22 NOTE — Telephone Encounter (Signed)
-----   Message from Mackie Pai, PA-C sent at 09/20/2015  5:46 PM EDT ----- Trichomonas, gonorrhea and trichomonas were all negative. Now bacterial vaginosis and yeast results pending. Will notify when results come in.

## 2015-09-23 ENCOUNTER — Other Ambulatory Visit: Payer: Self-pay

## 2015-09-23 MED ORDER — METRONIDAZOLE 500 MG PO TABS: 500.0000 mg | ORAL_TABLET | Freq: Two times a day (BID) | ORAL | 0 refills | Status: DC

## 2015-09-23 NOTE — Progress Notes (Signed)
Pt has seen results on MyChart and message also sent for patient to call back if any questions.

## 2015-09-29 ENCOUNTER — Other Ambulatory Visit: Payer: Self-pay | Admitting: Family Medicine

## 2015-09-29 DIAGNOSIS — G4700 Insomnia, unspecified: Secondary | ICD-10-CM

## 2015-09-29 NOTE — Telephone Encounter (Signed)
Last seen 08/05/15 and filled 07/12/15 #90   Please advise    KP

## 2015-12-06 ENCOUNTER — Other Ambulatory Visit: Payer: Self-pay | Admitting: Family Medicine

## 2015-12-06 DIAGNOSIS — N6489 Other specified disorders of breast: Secondary | ICD-10-CM

## 2015-12-22 ENCOUNTER — Other Ambulatory Visit: Payer: Self-pay | Admitting: Family Medicine

## 2015-12-22 ENCOUNTER — Ambulatory Visit
Admission: RE | Admit: 2015-12-22 | Discharge: 2015-12-22 | Disposition: A | Discharge status: Home / Self Care | Payer: 59 | Source: Ambulatory Visit | Attending: Family Medicine | Admitting: Family Medicine

## 2015-12-22 DIAGNOSIS — I10 Essential (primary) hypertension: Secondary | ICD-10-CM

## 2015-12-22 DIAGNOSIS — N6489 Other specified disorders of breast: Secondary | ICD-10-CM

## 2015-12-22 DIAGNOSIS — K219 Gastro-esophageal reflux disease without esophagitis: Secondary | ICD-10-CM

## 2016-01-05 ENCOUNTER — Ambulatory Visit (INDEPENDENT_AMBULATORY_CARE_PROVIDER_SITE_OTHER): Payer: 59 | Admitting: Family Medicine

## 2016-01-05 ENCOUNTER — Encounter: Payer: Self-pay | Admitting: Family Medicine

## 2016-01-05 VITALS — BP 138/90 | HR 83 | Temp 98.2°F | Resp 17 | Ht 63.0 in | Wt 235.8 lb

## 2016-01-05 DIAGNOSIS — I10 Essential (primary) hypertension: Secondary | ICD-10-CM | POA: Diagnosis not present

## 2016-01-05 DIAGNOSIS — Z0000 Encounter for general adult medical examination without abnormal findings: Secondary | ICD-10-CM | POA: Diagnosis not present

## 2016-01-05 DIAGNOSIS — Z23 Encounter for immunization: Secondary | ICD-10-CM

## 2016-01-05 MED ORDER — TRAZODONE HCL 50 MG PO TABS: ORAL_TABLET | ORAL | 0 refills | Status: DC

## 2016-01-05 NOTE — Patient Instructions (Signed)
Preventive Care for Adults, Female A healthy lifestyle and preventive care can promote health and wellness. Preventive health guidelines for women include the following key practices.  A routine yearly physical is a good way to check with your health care provider about your health and preventive screening. It is a chance to share any concerns and updates on your health and to receive a thorough exam.  Visit your dentist for a routine exam and preventive care every 6 months. Brush your teeth twice a day and floss once a day. Good oral hygiene prevents tooth decay and gum disease.  The frequency of eye exams is based on your age, health, family medical history, use of contact lenses, and other factors. Follow your health care provider's recommendations for frequency of eye exams.  Eat a healthy diet. Foods like vegetables, fruits, whole grains, low-fat dairy products, and lean protein foods contain the nutrients you need without too many calories. Decrease your intake of foods high in solid fats, added sugars, and salt. Eat the right amount of calories for you.Get information about a proper diet from your health care provider, if necessary.  Regular physical exercise is one of the most important things you can do for your health. Most adults should get at least 150 minutes of moderate-intensity exercise (any activity that increases your heart rate and causes you to sweat) each week. In addition, most adults need muscle-strengthening exercises on 2 or more days a week.  Maintain a healthy weight. The body mass index (BMI) is a screening tool to identify possible weight problems. It provides an estimate of body fat based on height and weight. Your health care provider can find your BMI and can help you achieve or maintain a healthy weight.For adults 20 years and older:  A BMI below 18.5 is considered underweight.  A BMI of 18.5 to 24.9 is normal.  A BMI of 25 to 29.9 is considered overweight.  A  BMI of 30 and above is considered obese.  Maintain normal blood lipids and cholesterol levels by exercising and minimizing your intake of saturated fat. Eat a balanced diet with plenty of fruit and vegetables. Blood tests for lipids and cholesterol should begin at age 45 and be repeated every 5 years. If your lipid or cholesterol levels are high, you are over 50, or you are at high risk for heart disease, you may need your cholesterol levels checked more frequently.Ongoing high lipid and cholesterol levels should be treated with medicines if diet and exercise are not working.  If you smoke, find out from your health care provider how to quit. If you do not use tobacco, do not start.  Lung cancer screening is recommended for adults aged 45-80 years who are at high risk for developing lung cancer because of a history of smoking. A yearly low-dose CT scan of the lungs is recommended for people who have at least a 30-pack-year history of smoking and are a current smoker or have quit within the past 15 years. A pack year of smoking is smoking an average of 1 pack of cigarettes a day for 1 year (for example: 1 pack a day for 30 years or 2 packs a day for 15 years). Yearly screening should continue until the smoker has stopped smoking for at least 15 years. Yearly screening should be stopped for people who develop a health problem that would prevent them from having lung cancer treatment.  If you are pregnant, do not drink alcohol. If you are  breastfeeding, be very cautious about drinking alcohol. If you are not pregnant and choose to drink alcohol, do not have more than 1 drink per day. One drink is considered to be 12 ounces (355 mL) of beer, 5 ounces (148 mL) of wine, or 1.5 ounces (44 mL) of liquor.  Avoid use of street drugs. Do not share needles with anyone. Ask for help if you need support or instructions about stopping the use of drugs.  High blood pressure causes heart disease and increases the risk  of stroke. Your blood pressure should be checked at least every 1 to 2 years. Ongoing high blood pressure should be treated with medicines if weight loss and exercise do not work.  If you are 55-79 years old, ask your health care provider if you should take aspirin to prevent strokes.  Diabetes screening is done by taking a blood sample to check your blood glucose level after you have not eaten for a certain period of time (fasting). If you are not overweight and you do not have risk factors for diabetes, you should be screened once every 3 years starting at age 45. If you are overweight or obese and you are 40-70 years of age, you should be screened for diabetes every year as part of your cardiovascular risk assessment.  Breast cancer screening is essential preventive care for women. You should practice "breast self-awareness." This means understanding the normal appearance and feel of your breasts and may include breast self-examination. Any changes detected, no matter how small, should be reported to a health care provider. Women in their 20s and 30s should have a clinical breast exam (CBE) by a health care provider as part of a regular health exam every 1 to 3 years. After age 40, women should have a CBE every year. Starting at age 40, women should consider having a mammogram (breast X-ray test) every year. Women who have a family history of breast cancer should talk to their health care provider about genetic screening. Women at a high risk of breast cancer should talk to their health care providers about having an MRI and a mammogram every year.  Breast cancer gene (BRCA)-related cancer risk assessment is recommended for women who have family members with BRCA-related cancers. BRCA-related cancers include breast, ovarian, tubal, and peritoneal cancers. Having family members with these cancers may be associated with an increased risk for harmful changes (mutations) in the breast cancer genes BRCA1 and  BRCA2. Results of the assessment will determine the need for genetic counseling and BRCA1 and BRCA2 testing.  Your health care provider may recommend that you be screened regularly for cancer of the pelvic organs (ovaries, uterus, and vagina). This screening involves a pelvic examination, including checking for microscopic changes to the surface of your cervix (Pap test). You may be encouraged to have this screening done every 3 years, beginning at age 21.  For women ages 30-65, health care providers may recommend pelvic exams and Pap testing every 3 years, or they may recommend the Pap and pelvic exam, combined with testing for human papilloma virus (HPV), every 5 years. Some types of HPV increase your risk of cervical cancer. Testing for HPV may also be done on women of any age with unclear Pap test results.  Other health care providers may not recommend any screening for nonpregnant women who are considered low risk for pelvic cancer and who do not have symptoms. Ask your health care provider if a screening pelvic exam is right for   you.  If you have had past treatment for cervical cancer or a condition that could lead to cancer, you need Pap tests and screening for cancer for at least 20 years after your treatment. If Pap tests have been discontinued, your risk factors (such as having a new sexual partner) need to be reassessed to determine if screening should resume. Some women have medical problems that increase the chance of getting cervical cancer. In these cases, your health care provider may recommend more frequent screening and Pap tests.  Colorectal cancer can be detected and often prevented. Most routine colorectal cancer screening begins at the age of 50 years and continues through age 75 years. However, your health care provider may recommend screening at an earlier age if you have risk factors for colon cancer. On a yearly basis, your health care provider may provide home test kits to check  for hidden blood in the stool. Use of a small camera at the end of a tube, to directly examine the colon (sigmoidoscopy or colonoscopy), can detect the earliest forms of colorectal cancer. Talk to your health care provider about this at age 50, when routine screening begins. Direct exam of the colon should be repeated every 5-10 years through age 75 years, unless early forms of precancerous polyps or small growths are found.  People who are at an increased risk for hepatitis B should be screened for this virus. You are considered at high risk for hepatitis B if:  You were born in a country where hepatitis B occurs often. Talk with your health care provider about which countries are considered high risk.  Your parents were born in a high-risk country and you have not received a shot to protect against hepatitis B (hepatitis B vaccine).  You have HIV or AIDS.  You use needles to inject street drugs.  You live with, or have sex with, someone who has hepatitis B.  You get hemodialysis treatment.  You take certain medicines for conditions like cancer, organ transplantation, and autoimmune conditions.  Hepatitis C blood testing is recommended for all people born from 1945 through 1965 and any individual with known risks for hepatitis C.  Practice safe sex. Use condoms and avoid high-risk sexual practices to reduce the spread of sexually transmitted infections (STIs). STIs include gonorrhea, chlamydia, syphilis, trichomonas, herpes, HPV, and human immunodeficiency virus (HIV). Herpes, HIV, and HPV are viral illnesses that have no cure. They can result in disability, cancer, and death.  You should be screened for sexually transmitted illnesses (STIs) including gonorrhea and chlamydia if:  You are sexually active and are younger than 24 years.  You are older than 24 years and your health care provider tells you that you are at risk for this type of infection.  Your sexual activity has changed  since you were last screened and you are at an increased risk for chlamydia or gonorrhea. Ask your health care provider if you are at risk.  If you are at risk of being infected with HIV, it is recommended that you take a prescription medicine daily to prevent HIV infection. This is called preexposure prophylaxis (PrEP). You are considered at risk if:  You are sexually active and do not regularly use condoms or know the HIV status of your partner(s).  You take drugs by injection.  You are sexually active with a partner who has HIV.  Talk with your health care provider about whether you are at high risk of being infected with HIV. If   you choose to begin PrEP, you should first be tested for HIV. You should then be tested every 3 months for as long as you are taking PrEP.  Osteoporosis is a disease in which the bones lose minerals and strength with aging. This can result in serious bone fractures or breaks. The risk of osteoporosis can be identified using a bone density scan. Women ages 67 years and over and women at risk for fractures or osteoporosis should discuss screening with their health care providers. Ask your health care provider whether you should take a calcium supplement or vitamin D to reduce the rate of osteoporosis.  Menopause can be associated with physical symptoms and risks. Hormone replacement therapy is available to decrease symptoms and risks. You should talk to your health care provider about whether hormone replacement therapy is right for you.  Use sunscreen. Apply sunscreen liberally and repeatedly throughout the day. You should seek shade when your shadow is shorter than you. Protect yourself by wearing long sleeves, pants, a wide-brimmed hat, and sunglasses year round, whenever you are outdoors.  Once a month, do a whole body skin exam, using a mirror to look at the skin on your back. Tell your health care provider of new moles, moles that have irregular borders, moles that  are larger than a pencil eraser, or moles that have changed in shape or color.  Stay current with required vaccines (immunizations).  Influenza vaccine. All adults should be immunized every year.  Tetanus, diphtheria, and acellular pertussis (Td, Tdap) vaccine. Pregnant women should receive 1 dose of Tdap vaccine during each pregnancy. The dose should be obtained regardless of the length of time since the last dose. Immunization is preferred during the 27th-36th week of gestation. An adult who has not previously received Tdap or who does not know her vaccine status should receive 1 dose of Tdap. This initial dose should be followed by tetanus and diphtheria toxoids (Td) booster doses every 10 years. Adults with an unknown or incomplete history of completing a 3-dose immunization series with Td-containing vaccines should begin or complete a primary immunization series including a Tdap dose. Adults should receive a Td booster every 10 years.  Varicella vaccine. An adult without evidence of immunity to varicella should receive 2 doses or a second dose if she has previously received 1 dose. Pregnant females who do not have evidence of immunity should receive the first dose after pregnancy. This first dose should be obtained before leaving the health care facility. The second dose should be obtained 4-8 weeks after the first dose.  Human papillomavirus (HPV) vaccine. Females aged 13-26 years who have not received the vaccine previously should obtain the 3-dose series. The vaccine is not recommended for use in pregnant females. However, pregnancy testing is not needed before receiving a dose. If a female is found to be pregnant after receiving a dose, no treatment is needed. In that case, the remaining doses should be delayed until after the pregnancy. Immunization is recommended for any person with an immunocompromised condition through the age of 61 years if she did not get any or all doses earlier. During the  3-dose series, the second dose should be obtained 4-8 weeks after the first dose. The third dose should be obtained 24 weeks after the first dose and 16 weeks after the second dose.  Zoster vaccine. One dose is recommended for adults aged 30 years or older unless certain conditions are present.  Measles, mumps, and rubella (MMR) vaccine. Adults born  before 1957 generally are considered immune to measles and mumps. Adults born in 1957 or later should have 1 or more doses of MMR vaccine unless there is a contraindication to the vaccine or there is laboratory evidence of immunity to each of the three diseases. A routine second dose of MMR vaccine should be obtained at least 28 days after the first dose for students attending postsecondary schools, health care workers, or international travelers. People who received inactivated measles vaccine or an unknown type of measles vaccine during 1963-1967 should receive 2 doses of MMR vaccine. People who received inactivated mumps vaccine or an unknown type of mumps vaccine before 1979 and are at high risk for mumps infection should consider immunization with 2 doses of MMR vaccine. For females of childbearing age, rubella immunity should be determined. If there is no evidence of immunity, females who are not pregnant should be vaccinated. If there is no evidence of immunity, females who are pregnant should delay immunization until after pregnancy. Unvaccinated health care workers born before 1957 who lack laboratory evidence of measles, mumps, or rubella immunity or laboratory confirmation of disease should consider measles and mumps immunization with 2 doses of MMR vaccine or rubella immunization with 1 dose of MMR vaccine.  Pneumococcal 13-valent conjugate (PCV13) vaccine. When indicated, a person who is uncertain of his immunization history and has no record of immunization should receive the PCV13 vaccine. All adults 65 years of age and older should receive this  vaccine. An adult aged 19 years or older who has certain medical conditions and has not been previously immunized should receive 1 dose of PCV13 vaccine. This PCV13 should be followed with a dose of pneumococcal polysaccharide (PPSV23) vaccine. Adults who are at high risk for pneumococcal disease should obtain the PPSV23 vaccine at least 8 weeks after the dose of PCV13 vaccine. Adults older than 45 years of age who have normal immune system function should obtain the PPSV23 vaccine dose at least 1 year after the dose of PCV13 vaccine.  Pneumococcal polysaccharide (PPSV23) vaccine. When PCV13 is also indicated, PCV13 should be obtained first. All adults aged 65 years and older should be immunized. An adult younger than age 65 years who has certain medical conditions should be immunized. Any person who resides in a nursing home or long-term care facility should be immunized. An adult smoker should be immunized. People with an immunocompromised condition and certain other conditions should receive both PCV13 and PPSV23 vaccines. People with human immunodeficiency virus (HIV) infection should be immunized as soon as possible after diagnosis. Immunization during chemotherapy or radiation therapy should be avoided. Routine use of PPSV23 vaccine is not recommended for American Indians, [place] Natives, or people younger than 65 years unless there are medical conditions that require PPSV23 vaccine. When indicated, people who have unknown immunization and have no record of immunization should receive PPSV23 vaccine. One-time revaccination 5 years after the first dose of PPSV23 is recommended for people aged 19-64 years who have chronic kidney failure, nephrotic syndrome, asplenia, or immunocompromised conditions. People who received 1-2 doses of PPSV23 before age 65 years should receive another dose of PPSV23 vaccine at age 65 years or later if at least 5 years have passed since the previous dose. Doses of PPSV23 are not  needed for people immunized with PPSV23 at or after age 65 years.  Meningococcal vaccine. Adults with asplenia or persistent complement component deficiencies should receive 2 doses of quadrivalent meningococcal conjugate (MenACWY-D) vaccine. The doses should be obtained   at least 2 months apart. Microbiologists working with certain meningococcal bacteria, [place] recruits, people at risk during an outbreak, and people who travel to or live in countries with a high rate of meningitis should be immunized. A first-year college student up through age 34 years who is living in a residence hall should receive a dose if she did not receive a dose on or after her 16th birthday. Adults who have certain high-risk conditions should receive one or more doses of vaccine.  Hepatitis A vaccine. Adults who wish to be protected from this disease, have certain high-risk conditions, work with hepatitis A-infected animals, work in hepatitis A research labs, or travel to or work in countries with a high rate of hepatitis A should be immunized. Adults who were previously unvaccinated and who anticipate close contact with an international adoptee during the first 60 days after arrival in the [place] from a country with a high rate of hepatitis A should be immunized.  Hepatitis B vaccine. Adults who wish to be protected from this disease, have certain high-risk conditions, may be exposed to blood or other infectious body fluids, are household contacts or sex partners of hepatitis B positive people, are clients or workers in certain care facilities, or travel to or work in countries with a high rate of hepatitis B should be immunized.  Haemophilus influenzae type b (Hib) vaccine. A previously unvaccinated person with asplenia or sickle cell disease or having a scheduled splenectomy should receive 1 dose of Hib vaccine. Regardless of previous immunization, a recipient of a hematopoietic stem cell transplant should receive a  3-dose series 6-12 months after her successful transplant. Hib vaccine is not recommended for adults with HIV infection. Preventive Services / Frequency Ages 35 to 4 years  Blood pressure check.** / Every 3-5 years.  Lipid and cholesterol check.** / Every 5 years beginning at age 60.  Clinical breast exam.** / Every 3 years for women in their 71s and 10s.  BRCA-related cancer risk assessment.** / For women who have family members with a BRCA-related cancer (breast, ovarian, tubal, or peritoneal cancers).  Pap test.** / Every 2 years from ages 76 through 26. Every 3 years starting at age 61 through age 76 or 93 with a history of 3 consecutive normal Pap tests.  HPV screening.** / Every 3 years from ages 37 through ages 60 to 51 with a history of 3 consecutive normal Pap tests.  Hepatitis C blood test.** / For any individual with known risks for hepatitis C.  Skin self-exam. / Monthly.  Influenza vaccine. / Every year.  Tetanus, diphtheria, and acellular pertussis (Tdap, Td) vaccine.** / Consult your health care provider. Pregnant women should receive 1 dose of Tdap vaccine during each pregnancy. 1 dose of Td every 10 years.  Varicella vaccine.** / Consult your health care provider. Pregnant females who do not have evidence of immunity should receive the first dose after pregnancy.  HPV vaccine. / 3 doses over 6 months, if 93 and younger. The vaccine is not recommended for use in pregnant females. However, pregnancy testing is not needed before receiving a dose.  Measles, mumps, rubella (MMR) vaccine.** / You need at least 1 dose of MMR if you were born in 1957 or later. You may also need a 2nd dose. For females of childbearing age, rubella immunity should be determined. If there is no evidence of immunity, females who are not pregnant should be vaccinated. If there is no evidence of immunity, females who are  pregnant should delay immunization until after pregnancy.  Pneumococcal  13-valent conjugate (PCV13) vaccine.** / Consult your health care provider.  Pneumococcal polysaccharide (PPSV23) vaccine.** / 1 to 2 doses if you smoke cigarettes or if you have certain conditions.  Meningococcal vaccine.** / 1 dose if you are age 68 to 8 years and a Market researcher living in a residence hall, or have one of several medical conditions, you need to get vaccinated against meningococcal disease. You may also need additional booster doses.  Hepatitis A vaccine.** / Consult your health care provider.  Hepatitis B vaccine.** / Consult your health care provider.  Haemophilus influenzae type b (Hib) vaccine.** / Consult your health care provider. Ages 7 to 53 years  Blood pressure check.** / Every year.  Lipid and cholesterol check.** / Every 5 years beginning at age 25 years.  Lung cancer screening. / Every year if you are aged 11-80 years and have a 30-pack-year history of smoking and currently smoke or have quit within the past 15 years. Yearly screening is stopped once you have quit smoking for at least 15 years or develop a health problem that would prevent you from having lung cancer treatment.  Clinical breast exam.** / Every year after age 48 years.  BRCA-related cancer risk assessment.** / For women who have family members with a BRCA-related cancer (breast, ovarian, tubal, or peritoneal cancers).  Mammogram.** / Every year beginning at age 41 years and continuing for as long as you are in good health. Consult with your health care provider.  Pap test.** / Every 3 years starting at age 65 years through age 37 or 70 years with a history of 3 consecutive normal Pap tests.  HPV screening.** / Every 3 years from ages 72 years through ages 60 to 40 years with a history of 3 consecutive normal Pap tests.  Fecal occult blood test (FOBT) of stool. / Every year beginning at age 21 years and continuing until age 5 years. You may not need to do this test if you get  a colonoscopy every 10 years.  Flexible sigmoidoscopy or colonoscopy.** / Every 5 years for a flexible sigmoidoscopy or every 10 years for a colonoscopy beginning at age 35 years and continuing until age 48 years.  Hepatitis C blood test.** / For all people born from [birth year] through 1965 and any individual with known risks for hepatitis C.  Skin self-exam. / Monthly.  Influenza vaccine. / Every year.  Tetanus, diphtheria, and acellular pertussis (Tdap/Td) vaccine.** / Consult your health care provider. Pregnant women should receive 1 dose of Tdap vaccine during each pregnancy. 1 dose of Td every 10 years.  Varicella vaccine.** / Consult your health care provider. Pregnant females who do not have evidence of immunity should receive the first dose after pregnancy.  Zoster vaccine.** / 1 dose for adults aged 30 years or older.  Measles, mumps, rubella (MMR) vaccine.** / You need at least 1 dose of MMR if you were born in 1957 or later. You may also need a second dose. For females of childbearing age, rubella immunity should be determined. If there is no evidence of immunity, females who are not pregnant should be vaccinated. If there is no evidence of immunity, females who are pregnant should delay immunization until after pregnancy.  Pneumococcal 13-valent conjugate (PCV13) vaccine.** / Consult your health care provider.  Pneumococcal polysaccharide (PPSV23) vaccine.** / 1 to 2 doses if you smoke cigarettes or if you have certain conditions.  Meningococcal vaccine.** /  Consult your health care provider.  Hepatitis A vaccine.** / Consult your health care provider.  Hepatitis B vaccine.** / Consult your health care provider.  Haemophilus influenzae type b (Hib) vaccine.** / Consult your health care provider. Ages 64 years and over  Blood pressure check.** / Every year.  Lipid and cholesterol check.** / Every 5 years beginning at age 23 years.  Lung cancer screening. / Every year if you  are aged 16-80 years and have a 30-pack-year history of smoking and currently smoke or have quit within the past 15 years. Yearly screening is stopped once you have quit smoking for at least 15 years or develop a health problem that would prevent you from having lung cancer treatment.  Clinical breast exam.** / Every year after age 74 years.  BRCA-related cancer risk assessment.** / For women who have family members with a BRCA-related cancer (breast, ovarian, tubal, or peritoneal cancers).  Mammogram.** / Every year beginning at age 44 years and continuing for as long as you are in good health. Consult with your health care provider.  Pap test.** / Every 3 years starting at age 58 years through age 22 or 39 years with 3 consecutive normal Pap tests. Testing can be stopped between 65 and 70 years with 3 consecutive normal Pap tests and no abnormal Pap or HPV tests in the past 10 years.  HPV screening.** / Every 3 years from ages 64 years through ages 70 or 61 years with a history of 3 consecutive normal Pap tests. Testing can be stopped between 65 and 70 years with 3 consecutive normal Pap tests and no abnormal Pap or HPV tests in the past 10 years.  Fecal occult blood test (FOBT) of stool. / Every year beginning at age 40 years and continuing until age 27 years. You may not need to do this test if you get a colonoscopy every 10 years.  Flexible sigmoidoscopy or colonoscopy.** / Every 5 years for a flexible sigmoidoscopy or every 10 years for a colonoscopy beginning at age 7 years and continuing until age 32 years.  Hepatitis C blood test.** / For all people born from [birth year] through 1965 and any individual with known risks for hepatitis C.  Osteoporosis screening.** / A one-time screening for women ages 30 years and over and women at risk for fractures or osteoporosis.  Skin self-exam. / Monthly.  Influenza vaccine. / Every year.  Tetanus, diphtheria, and acellular pertussis (Tdap/Td)  vaccine.** / 1 dose of Td every 10 years.  Varicella vaccine.** / Consult your health care provider.  Zoster vaccine.** / 1 dose for adults aged 35 years or older.  Pneumococcal 13-valent conjugate (PCV13) vaccine.** / Consult your health care provider.  Pneumococcal polysaccharide (PPSV23) vaccine.** / 1 dose for all adults aged 46 years and older.  Meningococcal vaccine.** / Consult your health care provider.  Hepatitis A vaccine.** / Consult your health care provider.  Hepatitis B vaccine.** / Consult your health care provider.  Haemophilus influenzae type b (Hib) vaccine.** / Consult your health care provider. ** Family history and personal history of risk and conditions may change your health care provider's recommendations.   This information is not intended to replace advice given to you by your health care provider. Make sure you discuss any questions you have with your health care provider.   Document Released: 04/10/2001 Document Revised: 03/05/2014 Document Reviewed: 07/10/2010 Elsevier Interactive Patient Education [employer].

## 2016-01-05 NOTE — Progress Notes (Signed)
Subjective:     Kristi Ramos is a 45 y.o. female and is here for a comprehensive physical exam. The patient reports no problems.  Social History   Social History  . Marital status: Divorced    Spouse name: N/A  . Number of children: N/A  . Years of education: N/A   Occupational History  .  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 Smoking status: Never Smoker  . Smokeless tobacco: Never Used  . Alcohol use Yes     Comment: Occ  . Drug use: No  . Sexual activity: Yes    Partners: Male   Other Topics Concern  . Not on file   Social History Narrative   Exercise--- just started back   Health Maintenance  Topic Date Due  . HIV Screening  05/10/1985  . TETANUS/TDAP  06/24/2015  . MAMMOGRAM  12/21/2016  . PAP SMEAR  01/06/2018  . INFLUENZA VACCINE  Completed    The following portions of the patient's history were reviewed and updated as appropriate: She  has a past medical history of Cervical cancer ([place]) (2010); Chicken pox; Depression (2009); GERD (gastroesophageal reflux disease); HTN (hypertension); Seasonal allergies; and Uterine cancer ([place]) (2010). She  does not have any pertinent problems on file. She  has a past surgical history that includes Cholecystectomy; Ankle surgery (Right); Leg Surgery; and Abdominal hysterectomy (2010). Her family history includes Alcohol abuse in her maternal grandfather and paternal grandmother; Arthritis in her father and mother; Depression in her son; Drug abuse in her son; Heart attack in her paternal grandfather; Heart attack (age of onset: 74) in her father; Heart disease in her father and paternal grandfather; Hyperlipidemia in her father; Hypertension in her brother and mother; Stroke in her mother; Thyroid disease in her mother. She  reports that she has never smoked. She has never used smokeless tobacco. She reports that she drinks alcohol. She reports that she does not use drugs. She has a  current medication list which includes the following prescription(s): fluticasone, lisinopril-hydrochlorothiazide, omeprazole, trazodone, cetirizine, fluconazole, and triamcinolone. Current Outpatient Prescriptions on File Prior to Visit  Medication Sig Dispense Refill  . fluticasone (FLONASE) 50 MCG/ACT nasal spray Place 2 sprays into both nostrils daily. 16 g 6  . lisinopril-hydrochlorothiazide (PRINZIDE,ZESTORETIC) 10-12.5 MG tablet TAKE 1 TABLET DAILY 90 tablet 1  . omeprazole (PRILOSEC) 20 MG capsule TAKE 1 CAPSULE DAILY 90 capsule 1  . traZODone (DESYREL) 50 MG tablet TAKE 1 TO 2 TABLETS (50 TO 100 MG) AT BEDTIME AS NEEDED FOR SLEEP 90 tablet 0  . cetirizine (ZYRTEC) 10 MG tablet Take 10 mg by mouth daily.    . fluconazole (DIFLUCAN) 150 MG tablet 1 tab po today. Another day 3 and last tab day 5. (Patient not taking: Reported on 01/05/2016) 3 tablet 0  . triamcinolone (KENALOG) 0.025 % cream Apply 1 application topically 2 (two) times daily. (Patient not taking: Reported on 01/05/2016) 30 g 0   No current facility-administered medications on file prior to visit.    She is allergic to augmentin [amoxicillin-pot clavulanate] and moxifloxacin..  Review of Systems Review of Systems  Constitutional: Negative for activity change, appetite change and fatigue.  HENT: Negative for hearing loss, congestion, tinnitus and ear discharge.  dentist q98m Eyes: Negative for visual disturbance (see optho q1y -- vision corrected to 20/20 with glasses).  Respiratory: Negative for cough, chest tightness and shortness of breath.   Cardiovascular: Negative for  chest pain, palpitations and leg swelling.  Gastrointestinal: Negative for abdominal pain, diarrhea, constipation and abdominal distention.  Genitourinary: Negative for urgency, frequency, decreased urine volume and difficulty urinating.  Musculoskeletal: Negative for back pain, arthralgias and gait problem.  Skin: Negative for color change, pallor and  rash.  Neurological: Negative for dizziness, light-headedness, numbness and headaches.  Hematological: Negative for adenopathy. Does not bruise/bleed easily.  Psychiatric/Behavioral: Negative for suicidal ideas, confusion, sleep disturbance, self-injury, dysphoric mood, decreased concentration and agitation.       Objective:    BP 138/90 (BP Location: Left Arm, Patient Position: Sitting, Cuff Size: Large)   Pulse 83   Temp 98.2 F (36.8 C) (Oral)   Resp 17   Ht 5\' 3"  (1.6 m)   Wt 235 lb 12.8 oz (107 kg)   SpO2 98%   BMI 41.77 kg/m  General appearance: alert, cooperative, appears stated age and no distress Head: Normocephalic, without obvious abnormality, atraumatic Eyes: conjunctivae/corneas clear. PERRL, EOM's intact. Fundi benign. Ears: normal TM's and external ear canals both ears Nose: Nares normal. Septum midline. Mucosa normal. No drainage or sinus tenderness. Throat: lips, mucosa, and tongue normal; teeth and gums normal Neck: no adenopathy, no carotid bruit, no JVD, supple, symmetrical, trachea midline and thyroid not enlarged, symmetric, no tenderness/mass/nodules Back: symmetric, no curvature. ROM normal. No CVA tenderness. Lungs: clear to auscultation bilaterally Breasts: normal appearance, no masses or tenderness Heart: regular rate and rhythm, S1, S2 normal, no murmur, click, rub or gallop Abdomen: soft, non-tender; bowel sounds normal; no masses,  no organomegaly Pelvic: deferred Extremities: extremities normal, atraumatic, no cyanosis or edema Pulses: 2+ and symmetric Skin: Skin color, texture, turgor normal. No rashes or lesions Lymph nodes: Cervical, supraclavicular, and axillary nodes normal. Neurologic: Alert and oriented X 3, normal strength and tone. Normal symmetric reflexes. Normal coordination and gait    Assessment:    Healthy female exam.      Plan:    ghm utd Check labs See After Visit Summary for Counseling Recommendations    1.  Preventative health care See above - Lipid panel; Future - CBC with Differential/Platelet; Future - Comprehensive metabolic panel; Future - POCT urinalysis dipstick; Future - TSH; Future - Tdap vaccine greater than or equal to 7yo IM  2. Essential hypertension Stable-- con't lisinopril con't meds - Lipid panel; Future - CBC with Differential/Platelet; Future - Comprehensive metabolic panel; Future - POCT urinalysis dipstick; Future - TSH; Future

## 2016-01-05 NOTE — Progress Notes (Signed)
Pre visit review using our clinic review tool, if applicable. No additional management support is needed unless otherwise documented below in the visit note. 

## 2016-01-06 ENCOUNTER — Other Ambulatory Visit: Payer: Self-pay

## 2016-01-06 MED ORDER — TRAZODONE HCL 50 MG PO TABS: ORAL_TABLET | ORAL | 0 refills | Status: DC

## 2016-01-12 ENCOUNTER — Encounter: Payer: Self-pay | Admitting: Family Medicine

## 2016-01-12 ENCOUNTER — Other Ambulatory Visit (INDEPENDENT_AMBULATORY_CARE_PROVIDER_SITE_OTHER): Payer: 59

## 2016-01-12 ENCOUNTER — Other Ambulatory Visit: Payer: Self-pay | Admitting: Family Medicine

## 2016-01-12 DIAGNOSIS — Z0000 Encounter for general adult medical examination without abnormal findings: Secondary | ICD-10-CM

## 2016-01-12 DIAGNOSIS — I10 Essential (primary) hypertension: Secondary | ICD-10-CM | POA: Diagnosis not present

## 2016-01-12 LAB — COMPREHENSIVE METABOLIC PANEL
ALBUMIN: 3.8 g/dL (ref 3.5–5.2)
ALK PHOS: 77 U/L (ref 39–117)
ALT: 39 U/L — ABNORMAL HIGH (ref 0–35)
AST: 26 U/L (ref 0–37)
BUN: 8 mg/dL (ref 6–23)
CALCIUM: 7.9 mg/dL — AB (ref 8.4–10.5)
CHLORIDE: 104 meq/L (ref 96–112)
CO2: 25 mEq/L (ref 19–32)
Creatinine, Ser: 0.78 mg/dL (ref 0.40–1.20)
GFR: 84.63 mL/min (ref >60.00)
Glucose, Bld: 110 mg/dL — ABNORMAL HIGH (ref 70–99)
POTASSIUM: 3 meq/L — AB (ref 3.5–5.1)
Sodium: 138 mEq/L (ref 135–145)
TOTAL PROTEIN: 6.4 g/dL (ref 6.0–8.3)
Total Bilirubin: 0.5 mg/dL (ref 0.2–1.2)

## 2016-01-12 LAB — CBC WITH DIFFERENTIAL/PLATELET
BASOS PCT: 0.5 % (ref 0.0–3.0)
Basophils Absolute: 0 10*3/uL (ref 0.0–0.1)
EOS PCT: 0.8 % (ref 0.0–5.0)
Eosinophils Absolute: 0 10*3/uL (ref 0.0–0.7)
HEMATOCRIT: 39.7 % (ref 36.0–46.0)
HEMOGLOBIN: 13.5 g/dL (ref 12.0–15.0)
LYMPHS PCT: 21.1 % (ref 12.0–46.0)
Lymphs Abs: 1.1 10*3/uL (ref 0.7–4.0)
MCHC: 34.1 g/dL (ref 30.0–36.0)
MCV: 87.1 fl (ref 78.0–100.0)
MONO ABS: 0.4 10*3/uL (ref 0.1–1.0)
MONOS PCT: 8.9 % (ref 3.0–12.0)
Neutro Abs: 3.4 10*3/uL (ref 1.4–7.7)
Neutrophils Relative %: 68.7 % (ref 43.0–77.0)
Platelets: 255 10*3/uL (ref 150.0–400.0)
RBC: 4.55 Mil/uL (ref 3.87–5.11)
RDW: 13.6 % (ref 11.5–15.5)
WBC: 5 10*3/uL (ref 4.0–10.5)

## 2016-01-12 LAB — LIPID PANEL
CHOLESTEROL: 121 mg/dL (ref 0–200)
HDL: 45.9 mg/dL (ref >39.00)
LDL Cholesterol: 61 mg/dL (ref 0–99)
NonHDL: 75.17
Total CHOL/HDL Ratio: 3
Triglycerides: 73 mg/dL (ref 0.0–149.0)
VLDL: 14.6 mg/dL (ref 0.0–40.0)

## 2016-01-12 LAB — TSH: TSH: 2.22 u[IU]/mL (ref 0.35–4.50)

## 2016-01-12 MED ORDER — LIRAGLUTIDE -WEIGHT MANAGEMENT 18 MG/3ML ~~LOC~~ SOPN: 3.0000 mg | PEN_INJECTOR | Freq: Every day | SUBCUTANEOUS | 2 refills | Status: DC

## 2016-01-12 MED ORDER — INSULIN PEN NEEDLE 32G X 6 MM MISC: 2 refills | Status: DC

## 2016-01-17 ENCOUNTER — Other Ambulatory Visit: Payer: Self-pay

## 2016-01-17 DIAGNOSIS — E876 Hypokalemia: Secondary | ICD-10-CM

## 2016-01-17 MED ORDER — POTASSIUM CHLORIDE CRYS ER 20 MEQ PO TBCR: 20.0000 meq | EXTENDED_RELEASE_TABLET | Freq: Every day | ORAL | 0 refills | Status: DC

## 2016-03-01 ENCOUNTER — Encounter: Payer: Self-pay | Admitting: Family Medicine

## 2016-03-01 ENCOUNTER — Telehealth: Payer: Self-pay | Admitting: Family Medicine

## 2016-03-01 ENCOUNTER — Ambulatory Visit (INDEPENDENT_AMBULATORY_CARE_PROVIDER_SITE_OTHER): Payer: 59 | Admitting: Family Medicine

## 2016-03-01 VITALS — BP 141/66 | HR 77 | Temp 98.7°F | Resp 16

## 2016-03-01 DIAGNOSIS — J209 Acute bronchitis, unspecified: Secondary | ICD-10-CM

## 2016-03-01 DIAGNOSIS — J0140 Acute pansinusitis, unspecified: Secondary | ICD-10-CM

## 2016-03-01 MED ORDER — METHYLPREDNISOLONE ACETATE 80 MG/ML IJ SUSP
80.0000 mg | Freq: Once | INTRAMUSCULAR | Status: AC
  Administered 2016-03-01: 80 mg via INTRAMUSCULAR

## 2016-03-01 MED ORDER — PROMETHAZINE-DM 6.25-15 MG/5ML PO SYRP: 5.0000 mL | ORAL_SOLUTION | Freq: Four times a day (QID) | ORAL | 0 refills | Status: DC | PRN

## 2016-03-01 MED ORDER — CLARITHROMYCIN ER 500 MG PO TB24: 1000.0000 mg | ORAL_TABLET | Freq: Every day | ORAL | 0 refills | Status: AC

## 2016-03-01 MED ORDER — FLUTICASONE PROPIONATE 50 MCG/ACT NA SUSP: 2.0000 | Freq: Every day | NASAL | 6 refills | Status: DC

## 2016-03-01 NOTE — Patient Instructions (Signed)

## 2016-03-01 NOTE — Progress Notes (Signed)
Patient ID: Kristi Ramos, female    DOB: [birth year]  Age: 46 y.o. MRN: [id number]    Subjective:  Subjective  HPI Kristi Ramos presents for sinus congestion x 2 weeks --- using mucinex, alka seltzer and tylenol with no relief.   No fever.  Review of Systems  Constitutional: Positive for chills. Negative for fever.  HENT: Positive for congestion, postnasal drip, rhinorrhea and sinus pressure.   Respiratory: Positive for cough and wheezing. Negative for chest tightness and shortness of breath.   Cardiovascular: Negative for chest pain, palpitations and leg swelling.  Allergic/Immunologic: Negative for environmental allergies.    History Past Medical History:  Diagnosis Date  . Cervical cancer ([facility]) 2010  . Chicken pox   . Depression 2009  . GERD (gastroesophageal reflux disease)   . HTN (hypertension)   . Seasonal allergies   . Uterine cancer ([facility]) 2010    She has a past surgical history that includes Cholecystectomy; Ankle surgery (Right); Leg Surgery; and Abdominal hysterectomy (2010).   Her family history includes Alcohol abuse in her maternal grandfather and paternal grandmother; Arthritis in her father and mother; Depression in her son; Drug abuse in her son; Heart attack in her paternal grandfather; Heart attack (age of onset: 35) in her father; Heart disease in her father and paternal grandfather; Hyperlipidemia in her father; Hypertension in her brother and mother; Stroke in her mother; Thyroid disease in her mother.She reports that she has never smoked. She has never used smokeless tobacco. She reports that she drinks alcohol. She reports that she does not use drugs.  Current Outpatient Prescriptions on File Prior to Visit  Medication Sig Dispense Refill  . cetirizine (ZYRTEC) 10 MG tablet Take 10 mg by mouth daily.    . fluticasone (FLONASE) 50 MCG/ACT nasal spray Place 2 sprays into both nostrils daily. 16 g 6  . lisinopril-hydrochlorothiazide  (PRINZIDE,ZESTORETIC) 10-12.5 MG tablet TAKE 1 TABLET DAILY 90 tablet 1  . omeprazole (PRILOSEC) 20 MG capsule TAKE 1 CAPSULE DAILY 90 capsule 1  . potassium chloride SA (K-DUR,KLOR-CON) 20 MEQ tablet Take 1 tablet (20 mEq total) by mouth daily. 30 tablet 0  . traZODone (DESYREL) 50 MG tablet TAKE 1 TO 2 TABLETS (50 TO 100 MG) AT BEDTIME AS NEEDED FOR SLEEP 90 tablet 0   No current facility-administered medications on file prior to visit.      Objective:  Objective  Physical Exam  Constitutional: She is oriented to person, place, and time. She appears well-developed and well-nourished.  HENT:  Head: Normocephalic and atraumatic.  Eyes: Conjunctivae and EOM are normal.  Neck: Normal range of motion. Neck supple. No JVD present. Carotid bruit is not present. No thyromegaly present.  Cardiovascular: Normal rate, regular rhythm and normal heart sounds.   No murmur heard. Pulmonary/Chest: Effort normal and breath sounds normal. No respiratory distress. She has no wheezes. She has no rales. She exhibits no tenderness.  Musculoskeletal: She exhibits no edema.  Neurological: She is alert and oriented to person, place, and time.  Psychiatric: She has a normal mood and affect.  Nursing note and vitals reviewed.  BP (!) 141/66 (BP Location: Right Arm, Cuff Size: Large)   Pulse 77   Temp 98.7 F (37.1 C) (Oral)   Resp 16   SpO2 98%  Wt Readings from Last 3 Encounters:  01/05/16 235 lb 12.8 oz (107 kg)  09/16/15 218 lb (98.9 kg)  08/05/15 218 lb 3.2 oz (99 kg)     Lab Results  Component Value Date   WBC 5.0 01/12/2016   HGB 13.5 01/12/2016   HCT 39.7 01/12/2016   PLT 255.0 01/12/2016   GLUCOSE 110 (H) 01/12/2016   CHOL 121 01/12/2016   TRIG 73.0 01/12/2016   HDL 45.90 01/12/2016   LDLCALC 61 01/12/2016   ALT 39 (H) 01/12/2016   AST 26 01/12/2016   NA 138 01/12/2016   K 3.0 (L) 01/12/2016   CL 104 01/12/2016   CREATININE 0.78 01/12/2016   BUN 8 01/12/2016   CO2 25 01/12/2016     TSH 2.22 01/12/2016   HGBA1C 5.8 12/24/2013    Mm Diag Breast Tomo Bilateral  Result Date: 12/22/2015 CLINICAL DATA:  One year follow-up of a left breast asymmetry EXAM: 2D DIGITAL DIAGNOSTIC BILATERAL MAMMOGRAM WITH CAD AND ADJUNCT TOMO COMPARISON:  Previous exam(s). ACR Breast Density Category b: There are scattered areas of fibroglandular density. FINDINGS: The asymmetry in the deep left breast, slightly medial to the nipple, is again seen. I suspect a fatty hilum. This is most likely a small lymph node. It measures slightly larger today, measuring between 4.5 and 5 mm versus 4 mm previously. The tiny difference in size/conspicuity could represent difference in positioning and technique. Mammographic images were processed with CAD. IMPRESSION: The asymmetry in the medial left breast at a posterior depth is very similar in appearance. The slight increased conspicuity may be positional/technical. No other suspicious findings. RECOMMENDATION: Six-month follow-up mammogram of the left breast to ensure further stability of the posterior slightly medial left asymmetry. I have discussed the findings and recommendations with the patient. Results were also provided in writing at the conclusion of the visit. If applicable, a reminder letter will be sent to the patient regarding the next appointment. BI-RADS CATEGORY  3: Probably benign. Electronically Signed   By: Dorise Bullion III M.D   On: 12/22/2015 10:44     Assessment & Plan:  Plan  I have discontinued Ms. Hefferan's triamcinolone, fluconazole, Liraglutide -Weight Management, and Insulin Pen Needle. I am also having her start on clarithromycin, fluticasone, and promethazine-dextromethorphan. Additionally, I am having her maintain her cetirizine, fluticasone, omeprazole, lisinopril-hydrochlorothiazide, traZODone, and potassium chloride SA. We administered methylPREDNISolone acetate.  Meds ordered this encounter  Medications  . clarithromycin (BIAXIN  XL) 500 MG 24 hr tablet    Sig: Take 2 tablets (1,000 mg total) by mouth daily.    Dispense:  28 tablet    Refill:  0  . fluticasone (FLONASE) 50 MCG/ACT nasal spray    Sig: Place 2 sprays into both nostrils daily.    Dispense:  16 g    Refill:  6  . promethazine-dextromethorphan (PROMETHAZINE-DM) 6.25-15 MG/5ML syrup    Sig: Take 5 mLs by mouth 4 (four) times daily as needed.    Dispense:  118 mL    Refill:  0  . methylPREDNISolone acetate (DEPO-MEDROL) injection 80 mg    Problem List Items Addressed This Visit    None    Visit Diagnoses    Acute bronchitis, unspecified organism    -  Primary   Relevant Medications   clarithromycin (BIAXIN XL) 500 MG 24 hr tablet   promethazine-dextromethorphan (PROMETHAZINE-DM) 6.25-15 MG/5ML syrup   methylPREDNISolone acetate (DEPO-MEDROL) injection 80 mg (Completed)   Acute pansinusitis, recurrence not specified       Relevant Medications   clarithromycin (BIAXIN XL) 500 MG 24 hr tablet   fluticasone (FLONASE) 50 MCG/ACT nasal spray   promethazine-dextromethorphan (PROMETHAZINE-DM) 6.25-15 MG/5ML syrup   methylPREDNISolone acetate (DEPO-MEDROL)  injection 80 mg (Completed)      Follow-up: Return if symptoms worsen or fail to improve.  Ann Held, DO

## 2016-03-01 NOTE — Telephone Encounter (Signed)
Pt would like to have PCP send in Diflucin to pharmacy also. She says that she received a antibiotic in todays visit and always need it. Marland Kitchen    Pharmacy: [facility] [address] [place], [place]

## 2016-03-01 NOTE — Progress Notes (Signed)
Pre visit review using our clinic review tool, if applicable. No additional management support is needed unless otherwise documented below in the visit note. 

## 2016-03-02 MED ORDER — FLUCONAZOLE 150 MG PO TABS: ORAL_TABLET | ORAL | 0 refills | Status: DC

## 2016-03-02 NOTE — Telephone Encounter (Signed)
Sent in diflucan as instructed and patient informed

## 2016-03-02 NOTE — Telephone Encounter (Signed)
Diflucan 150 mg #2  1 po qd x1, may repeat in 3 days  

## 2016-03-05 ENCOUNTER — Encounter: Payer: Self-pay | Admitting: Medical

## 2016-03-05 ENCOUNTER — Ambulatory Visit (INDEPENDENT_AMBULATORY_CARE_PROVIDER_SITE_OTHER): Payer: 59 | Admitting: Medical

## 2016-03-05 VITALS — BP 132/84 | HR 100 | Temp 98.1°F | Resp 16 | Ht 63.0 in | Wt 235.0 lb

## 2016-03-05 DIAGNOSIS — J0100 Acute maxillary sinusitis, unspecified: Secondary | ICD-10-CM | POA: Diagnosis not present

## 2016-03-05 DIAGNOSIS — J111 Influenza due to unidentified influenza virus with other respiratory manifestations: Secondary | ICD-10-CM | POA: Diagnosis not present

## 2016-03-05 DIAGNOSIS — M791 Myalgia, unspecified site: Secondary | ICD-10-CM

## 2016-03-05 MED ORDER — OSELTAMIVIR PHOSPHATE 75 MG PO CAPS: 75.0000 mg | ORAL_CAPSULE | Freq: Two times a day (BID) | ORAL | 0 refills | Status: DC

## 2016-03-05 MED ORDER — TRAZODONE HCL 50 MG PO TABS: ORAL_TABLET | ORAL | 1 refills | Status: DC

## 2016-03-05 MED ORDER — DOXYCYCLINE HYCLATE 100 MG PO TABS: 100.0000 mg | ORAL_TABLET | Freq: Two times a day (BID) | ORAL | 0 refills | Status: DC

## 2016-03-05 NOTE — Progress Notes (Signed)
Pre visit review using our clinic review tool, if applicable. No additional management support is needed unless otherwise documented below in the visit note/SLS Trazodone Rx to pharmacy per VO of Dr. Carollee Herter at patient request.

## 2016-03-05 NOTE — Progress Notes (Signed)
Subjective:    Patient ID: Kristi Ramos, female    DOB: 1970-12-20, 46 y.o.   MRN: [id number]  HPI  Pt in with head and chest congestion with sinus pressure. Pt states about 2 weeks. Started with mild st. Pt states last week was evaluated and given biaxin, and phenergan dm. Pt states lungs feel better but she stills feels like maxillary sinus infection. Pt has some left ear pressure/pain. Mild dizziness at times. Some residual cough that is productive.   Pt states this past Saturday felt more fatigued all the sudden. Pt states this weekend some fever again along with recurrent body aches.  lmp- had paritical hysterectomy.   Review of Systems  Constitutional: Positive for fatigue and fever. Negative for chills.  HENT: Positive for congestion, sinus pain and sinus pressure. Negative for sneezing and sore throat.   Respiratory: Positive for cough. Negative for chest tightness, shortness of breath and wheezing.   Cardiovascular: Negative for chest pain and palpitations.  Musculoskeletal: Positive for myalgias. Negative for back pain.  Hematological: Negative for adenopathy. Does not bruise/bleed easily.  Psychiatric/Behavioral: Negative for behavioral problems and confusion.    Past Medical History:  Diagnosis Date  . Cervical cancer ([facility]) 2010  . Chicken pox   . Depression 2009  . GERD (gastroesophageal reflux disease)   . HTN (hypertension)   . Seasonal allergies   . Uterine cancer ([facility]) 2010     Social History   Social History  . Marital status: Divorced    Spouse name: N/A  . Number of children: N/A  . Years of education: N/A   Occupational History  .  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 Smoking status: Never Smoker  . Smokeless tobacco: Never Used  . Alcohol use Yes     Comment: Occ  . Drug use: No  . Sexual activity: Yes    Partners: Male   Other Topics Concern  . Not on file   Social History Narrative   Exercise--- just started back    Past Surgical History:  Procedure Laterality Date  . ABDOMINAL HYSTERECTOMY  2010   TAH-- cancer  . ANKLE SURGERY Right    Osteomyelitis  . CHOLECYSTECTOMY    . LEG SURGERY     Calf Muscle Surgery    Family History  Problem Relation Age of Onset  . Arthritis Mother   . Stroke Mother   . Hypertension Mother   . Thyroid disease Mother   . Arthritis Father   . Hyperlipidemia Father   . Heart disease Father   . Heart attack Father 61  . Drug abuse Son   . Alcohol abuse Paternal Grandmother   . Alcohol abuse Maternal Grandfather   . Heart disease Paternal Grandfather   . Heart attack Paternal Grandfather   . Depression Son   . Hypertension Brother     Allergies  Allergen Reactions  . Augmentin [Amoxicillin-Pot Clavulanate] Other (See Comments)    tachycardia  . Moxifloxacin Rash    Current Outpatient Prescriptions on File Prior to Visit  Medication Sig Dispense Refill  . cetirizine (ZYRTEC) 10 MG tablet Take 10 mg by mouth daily.    . clarithromycin (BIAXIN XL) 500 MG 24 hr tablet Take 2 tablets (1,000 mg total) by mouth daily. 28 tablet 0  . fluticasone (FLONASE) 50 MCG/ACT nasal spray Place 2 sprays into both nostrils daily. 16 g 6  . lisinopril-hydrochlorothiazide (PRINZIDE,ZESTORETIC) 10-12.5 MG tablet  TAKE 1 TABLET DAILY 90 tablet 1  . omeprazole (PRILOSEC) 20 MG capsule TAKE 1 CAPSULE DAILY 90 capsule 1  . potassium chloride SA (K-DUR,KLOR-CON) 20 MEQ tablet Take 1 tablet (20 mEq total) by mouth daily. 30 tablet 0  . promethazine-dextromethorphan (PROMETHAZINE-DM) 6.25-15 MG/5ML syrup Take 5 mLs by mouth 4 (four) times daily as needed. 118 mL 0  . fluconazole (DIFLUCAN) 150 MG tablet Take 1 by mouth once may repeat in 3 days. (Patient not taking: Reported on 03/05/2016) 2 tablet 0   No current facility-administered medications on file prior to visit.     BP 132/84 (BP Location: Left Arm, Patient Position: Sitting, Cuff Size: Large)    Pulse 100   Temp 98.1 F (36.7 C) (Oral)   Resp 16   Ht 5\' 3"  (1.6 m)   Wt 235 lb (106.6 kg)   SpO2 98%   BMI 41.63 kg/m       Objective:   Physical Exam   General  Mental Status - Alert. General Appearance - Well groomed. Not in acute distress.  Skin Rashes- No Rashes.  HEENT Head- Normal. Ear Auditory Canal - Left- Normal. Right - Normal.Tympanic Membrane- Left- Normal. Right- Normal. Eye Sclera/Conjunctiva- Left- Normal. Right- Normal. Nose & Sinuses Nasal Mucosa- Left-  Boggy and Congested. Right-  Boggy and  Congested.Lt  maxillary but no  frontal sinus pressure. Mouth & Throat Lips: Upper Lip- Normal: no dryness, cracking, pallor, cyanosis, or vesicular eruption. Lower Lip-Normal: no dryness, cracking, pallor, cyanosis or vesicular eruption. Buccal Mucosa- Bilateral- No Aphthous ulcers. Oropharynx- No Discharge or Erythema. +Pnd Tonsils: Characteristics- Bilateral- No Erythema or Congestion. Size/Enlargement- Bilateral- No enlargement. Discharge- bilateral-None.  Neck Neck- Supple. No Masses.   Chest and Lung Exam Auscultation: Breath Sounds:-Clear even and unlabored.  Cardiovascular Auscultation:Rythm- Regular, rate and rhythm. Murmurs & Other Heart Sounds:Ausculatation of the heart reveal- No Murmurs.  Lymphatic Head & Neck General Head & Neck Lymphatics: Bilateral: Description- No Localized lymphadenopathy.      Assessment & Plan:  You appear to have a sinus infection. I am prescribing  doxycycline antibiotic for the infection stop biaxin since you report worsening sinus pressure while on. To help with the nasal congestion continue nasal steroid and try netty pot at night. For your associated cough, continue phenergan cough syrup..  With your recent body ache, fever return and fatigue(on top of sinus symptoms). I will rx tamiflu in event of flu.(no flu test in office today).  Rest, hydrate, tylenol for fever.  Follow up in 7 days or as  needed.    Kristi Ramos, Percell Miller, PA-C

## 2016-03-05 NOTE — Patient Instructions (Addendum)
You appear to have a sinus infection. I am prescribing  doxycycline antibiotic for the infection stop biaxin since you report worsening sinus pressure while on). To help with the nasal congestion continue nasal steroid and try netty pot at night. For your associated cough, continue phenergan.  With your recent body ache, fever return and fatigue(on top of sinus symptoms) .I will rx tamiflu in event of flu.  Rest, hydrate, tylenol for fever.    Follow up in 7 days or as needed.

## 2016-05-11 ENCOUNTER — Ambulatory Visit (INDEPENDENT_AMBULATORY_CARE_PROVIDER_SITE_OTHER): Payer: 59 | Admitting: Family Medicine

## 2016-05-11 ENCOUNTER — Encounter: Payer: Self-pay | Admitting: Family Medicine

## 2016-05-11 VITALS — BP 126/70 | HR 110 | Temp 98.1°F | Ht 63.0 in | Wt 242.6 lb

## 2016-05-11 DIAGNOSIS — B9689 Other specified bacterial agents as the cause of diseases classified elsewhere: Secondary | ICD-10-CM | POA: Diagnosis not present

## 2016-05-11 DIAGNOSIS — N760 Acute vaginitis: Secondary | ICD-10-CM

## 2016-05-11 MED ORDER — METRONIDAZOLE 500 MG PO TABS: 500.0000 mg | ORAL_TABLET | Freq: Two times a day (BID) | ORAL | 0 refills | Status: DC

## 2016-05-11 NOTE — Progress Notes (Signed)
Chief Complaint  Patient presents with  . Vaginal Itching    since Monday-tried Diflucan and it did not help    Kristi Ramos is a 46 y.o. female here for vaginal complaint.  Duration: 4 days Denies discharge She is having itching only, has a hx of recurring BV infections, states this is similar. Odor: No New sexual partner: No Urinary complaints: No IUD? No Denies fevers, bleeding, pregnancy abdominal pain.  ROS:  GU: +discharge, denies pain with urination  Past Medical History:  Diagnosis Date  . Cervical cancer ([place]) 2010  . Chicken pox   . Depression 2009  . GERD (gastroesophageal reflux disease)   . HTN (hypertension)   . Seasonal allergies   . Uterine cancer ([place]) 2010   Family History  Problem Relation Age of Onset  . Arthritis Mother   . Stroke Mother   . Hypertension Mother   . Thyroid disease Mother   . Arthritis Father   . Hyperlipidemia Father   . Heart disease Father   . Heart attack Father 26  . Drug abuse Son   . Alcohol abuse Paternal Grandmother   . Alcohol abuse Maternal Grandfather   . Heart disease Paternal Grandfather   . Heart attack Paternal Grandfather   . Depression Son   . Hypertension Brother     BP 126/70 (BP Location: Left Arm, Patient Position: Sitting, Cuff Size: Large)   Pulse (!) 110   Temp 98.1 F (36.7 C) (Oral)   Ht 5\' 3"  (1.6 m)   Wt 242 lb 9.6 oz (110 kg)   SpO2 98%   BMI 42.97 kg/m  Gen: Awake, alert, appears stated age Heart: RRR, no murmurs Lungs: CTAB, no accessory muscle use Abd: BS+, soft, NT, ND, no masses or organomegaly GU: Deferred Psych: Age appropriate judgment and insight, nml mood and affect  Bacterial vaginosis - Plan: metroNIDAZOLE (FLAGYL) 500 MG tablet  Orders as above. Will treat empirically given her hx of BV infections. Reminded patient not to drink on this medication. Discussed low yield of obtaining a urine today as we are treating empirically and we do not have a high suspicion of  UTI or STI.  F/u in 1 week if symptoms worsne or fail to improve. We will do a pelvic exam and urine ancillary at that time. Pt voiced understanding and agreement to the plan.  [place], DO 05/11/16 4:04 PM

## 2016-05-11 NOTE — Patient Instructions (Signed)
Do not drink on this medication.   If you are not better after finishing the antibiotic, return to clinic.

## 2016-05-11 NOTE — Progress Notes (Signed)
Pre visit review using our clinic review tool, if applicable. No additional management support is needed unless otherwise documented below in the visit note. 

## 2016-05-31 ENCOUNTER — Ambulatory Visit (INDEPENDENT_AMBULATORY_CARE_PROVIDER_SITE_OTHER): Payer: 59 | Admitting: Family Medicine

## 2016-05-31 ENCOUNTER — Other Ambulatory Visit (HOSPITAL_COMMUNITY)
Admission: RE | Admit: 2016-05-31 | Discharge: 2016-05-31 | Disposition: A | Discharge status: Home / Self Care | Payer: 59 | Source: Ambulatory Visit | Attending: Family Medicine | Admitting: Family Medicine

## 2016-05-31 VITALS — BP 112/84 | HR 83 | Temp 98.4°F | Ht 63.0 in | Wt 246.6 lb

## 2016-05-31 DIAGNOSIS — N898 Other specified noninflammatory disorders of vagina: Secondary | ICD-10-CM | POA: Insufficient documentation

## 2016-05-31 MED ORDER — FLUCONAZOLE 150 MG PO TABS: 150.0000 mg | ORAL_TABLET | Freq: Once | ORAL | 0 refills | Status: AC

## 2016-05-31 NOTE — Progress Notes (Signed)
Pre visit review using our clinic review tool, if applicable. No additional management support is needed unless otherwise documented below in the visit note. 

## 2016-05-31 NOTE — Patient Instructions (Signed)
It was good to see you today- I will be in touch with your swab asap In the meantime please try the diflucan once weekly as needed

## 2016-05-31 NOTE — Progress Notes (Signed)
[facility] [place], [place], [place] [address] [phone]  Date:  05/31/2016   Name:  Kristi Ramos   DOB:  Feb 02, 1971   MRN:  [id number]  PCP:  Ann Held, DO    Chief Complaint: Vaginal Discharge (c/o vaginal discharge, itching and feels swollen. )   History of Present Illness:  Kristi Ramos is a 46 y.o. very pleasant female patient who presents with the following:  She was here on 3/16 and dx with likely BV, given an rx for flagyl (Last wet prep on chart in 08/2015- was positive for gardnerella)  Prior to her last visit she had also tried diflucan, but this did not seem to work so she cme in to be seen  She felt that she got back to normal, but her sx returned on Monday of this week (today is Thursday) She did not quite finish out her most recent rx for  flagyl- did not take the last couple of pills  She is s/ p hysterectomy about 10 years ago- she had cervical cancer "that spread to my uterus."  She has paps of the vaginal cuff every 3 years which have bee normal Her vulva feels swollen, itchy- not really having any discharge or odor however She has not noted any urinary sx She is otherwise well- no fever, chills, abd pain  She got married just about one year ago and she had and her husband have pretty frequent intercourse- "we're newlyweds!"   Lab Results  Component Value Date   HGBA1C 5.8 12/24/2013     Patient Active Problem List   Diagnosis Date Noted  . Acute upper respiratory infection 05/05/2015  . HTN (hypertension) 03/10/2015  . Morbid obesity ([place]) 03/27/2014  . Insomnia 03/27/2014  . Diarrhea 12/28/2013  . Pain in the abdomen 12/28/2013  . ANXIETY 06/24/2008  . URINARY URGENCY 06/02/2008  . DYSURIA 11/18/2007  . OSTEOMYELITIS NOS, LOWER LEG 12/25/2006    Past Medical History:  Diagnosis Date  . Cervical cancer ([place]) 2010  . Chicken pox   . Depression  2009  . GERD (gastroesophageal reflux disease)   . HTN (hypertension)   . Seasonal allergies   . Uterine cancer ([place]) 2010    Past Surgical History:  Procedure Laterality Date  . ABDOMINAL HYSTERECTOMY  2010   TAH-- cancer  . ANKLE SURGERY Right    Osteomyelitis  . CHOLECYSTECTOMY    . LEG SURGERY     Calf Muscle Surgery    Social History  Substance Use Topics  . Smoking status: Never Smoker  . Smokeless tobacco: Never Used  . Alcohol use Yes     Comment: Occ    Family History  Problem Relation Age of Onset  . Arthritis Mother   . Stroke Mother   . Hypertension Mother   . Thyroid disease Mother   . Arthritis Father   . Hyperlipidemia Father   . Heart disease Father   . Heart attack Father 79  . Drug abuse Son   . Alcohol abuse Paternal Grandmother   . Alcohol abuse Maternal Grandfather   . Heart disease Paternal Grandfather   . Heart attack Paternal Grandfather   . Depression Son   . Hypertension Brother     Allergies  Allergen Reactions  . Augmentin [Amoxicillin-Pot Clavulanate] Other (See Comments)    tachycardia  . Moxifloxacin Rash    Medication list has been reviewed  and updated.  Current Outpatient Prescriptions on File Prior to Visit  Medication Sig Dispense Refill  . cetirizine (ZYRTEC) 10 MG tablet Take 10 mg by mouth daily.    . fluticasone (FLONASE) 50 MCG/ACT nasal spray Place 2 sprays into both nostrils daily. 16 g 6  . lisinopril-hydrochlorothiazide (PRINZIDE,ZESTORETIC) 10-12.5 MG tablet TAKE 1 TABLET DAILY 90 tablet 1  . omeprazole (PRILOSEC) 20 MG capsule TAKE 1 CAPSULE DAILY 90 capsule 1  . traZODone (DESYREL) 50 MG tablet TAKE 1 TO 2 TABLETS (50 TO 100 MG) AT BEDTIME AS NEEDED FOR SLEEP 60 tablet 1  . potassium chloride SA (K-DUR,KLOR-CON) 20 MEQ tablet Take 1 tablet (20 mEq total) by mouth daily. (Patient not taking: Reported on 05/31/2016) 30 tablet 0   No current facility-administered medications on file prior to visit.     Review  of Systems:  As per HPI- otherwise negative.   Physical Examination: Vitals:   05/31/16 1135  BP: 112/84  Pulse: 83  Temp: 98.4 F (36.9 C)   Vitals:   05/31/16 1135  Weight: 246 lb 9.6 oz (111.9 kg)  Height: 5\' 3"  (1.6 m)   Body mass index is 43.68 kg/m. Ideal Body Weight: Weight in (lb) to have BMI = 25: 140.8  GEN: WDWN, NAD, Non-toxic, A & O x 3, obese, otherwise looks well HEENT: Atraumatic, Normocephalic. Neck supple. No masses, No LAD. Ears and Nose: No external deformity. CV: RRR, No M/G/R. No JVD. No thrill. No extra heart sounds. PULM: CTA B, no wheezes, crackles, rhonchi. No retractions. No resp. distress. No accessory muscle use. ABD: S, NT, ND, +BS. No rebound. No HSM.  Benign belly EXTR: No c/c/e NEURO Normal gait.  PSYCH: Normally interactive. Conversant. Not depressed or anxious appearing.  Calm demeanor.  External genitals appear normal.  Vagina is normal- no odor or discharge, no redness or swelling, no lesions noted.  Cervix has been removed    Assessment and Plan: Vaginal irritation - Plan: fluconazole (DIFLUCAN) 150 MG tablet, Cervicovaginal ancillary only  Here today with sx most suggestive of yeast vaginitis.  Will treat with weekly diflucan- gave her 4 pills to use once a week as needed Sent out wet prep for her and will be in touch with this asap Will plan further follow- up pending labs. She will let me know if any concerns in the meantime   Signed Lamar Blinks, MD

## 2016-06-01 LAB — CERVICOVAGINAL ANCILLARY ONLY: Wet Prep (BD Affirm): POSITIVE — AB

## 2016-06-02 ENCOUNTER — Encounter: Payer: Self-pay | Admitting: Family Medicine

## 2016-06-06 MED ORDER — METRONIDAZOLE 500 MG PO TABS: 500.0000 mg | ORAL_TABLET | Freq: Two times a day (BID) | ORAL | 0 refills | Status: DC

## 2016-06-25 ENCOUNTER — Other Ambulatory Visit: Payer: Self-pay | Admitting: Family Medicine

## 2016-06-25 DIAGNOSIS — G4700 Insomnia, unspecified: Secondary | ICD-10-CM

## 2016-06-26 ENCOUNTER — Other Ambulatory Visit: Payer: Self-pay | Admitting: Family Medicine

## 2016-06-26 DIAGNOSIS — K219 Gastro-esophageal reflux disease without esophagitis: Secondary | ICD-10-CM

## 2016-06-26 DIAGNOSIS — I10 Essential (primary) hypertension: Secondary | ICD-10-CM

## 2016-06-26 NOTE — Telephone Encounter (Signed)
Requesting:    trazodone Contract    none UDS    none Last OV      03/01/16 Last Refill     #60 with 1 refill on 03/05/16  Please Advise

## 2016-07-06 ENCOUNTER — Ambulatory Visit: Payer: Self-pay | Admitting: Family Medicine

## 2016-07-19 ENCOUNTER — Ambulatory Visit: Payer: Self-pay | Admitting: Family Medicine

## 2016-07-25 DIAGNOSIS — N760 Acute vaginitis: Secondary | ICD-10-CM | POA: Diagnosis not present

## 2016-07-26 DIAGNOSIS — N760 Acute vaginitis: Secondary | ICD-10-CM | POA: Diagnosis not present

## 2016-07-27 ENCOUNTER — Ambulatory Visit: Payer: 59 | Admitting: Medical

## 2016-08-23 ENCOUNTER — Ambulatory Visit (INDEPENDENT_AMBULATORY_CARE_PROVIDER_SITE_OTHER): Payer: 59 | Admitting: Family Medicine

## 2016-08-23 VITALS — BP 132/82 | HR 79 | Temp 98.4°F | Ht 63.0 in | Wt 250.3 lb

## 2016-08-23 DIAGNOSIS — R002 Palpitations: Secondary | ICD-10-CM

## 2016-08-23 DIAGNOSIS — L237 Allergic contact dermatitis due to plants, except food: Secondary | ICD-10-CM | POA: Diagnosis not present

## 2016-08-23 DIAGNOSIS — R200 Anesthesia of skin: Secondary | ICD-10-CM

## 2016-08-23 DIAGNOSIS — R202 Paresthesia of skin: Secondary | ICD-10-CM

## 2016-08-23 DIAGNOSIS — R011 Cardiac murmur, unspecified: Secondary | ICD-10-CM

## 2016-08-23 DIAGNOSIS — I10 Essential (primary) hypertension: Secondary | ICD-10-CM | POA: Diagnosis not present

## 2016-08-23 MED ORDER — MOMETASONE FUROATE 0.1 % EX CREA: 1.0000 "application " | TOPICAL_CREAM | Freq: Every day | CUTANEOUS | 0 refills | Status: DC

## 2016-08-23 NOTE — Progress Notes (Signed)
Patient ID: Kristi Ramos, female    DOB: [birth year]  Age: 46 y.o. MRN: [id number]    Subjective:  Subjective  HPI Kristi Ramos presents for f/u bp.  She c/o palpitations since gaining weight.    Review of Systems  Constitutional: Negative for chills and fever.  HENT: Negative for congestion and hearing loss.   Eyes: Negative for discharge.  Respiratory: Negative for cough and shortness of breath.   Cardiovascular: Positive for palpitations. Negative for chest pain and leg swelling.  Gastrointestinal: Negative for abdominal pain, blood in stool, constipation, diarrhea, nausea and vomiting.  Genitourinary: Negative for dysuria, frequency, hematuria and urgency.  Musculoskeletal: Negative for back pain and myalgias.  Skin: Negative for rash.  Allergic/Immunologic: Negative for environmental allergies.  Neurological: Positive for numbness. Negative for dizziness, weakness and headaches.  Hematological: Does not bruise/bleed easily.  Psychiatric/Behavioral: Negative for suicidal ideas. The patient is not nervous/anxious.     History Past Medical History:  Diagnosis Date  . Cervical cancer ([place]) 2010  . Chicken pox   . Depression 2009  . GERD (gastroesophageal reflux disease)   . HTN (hypertension)   . Seasonal allergies   . Uterine cancer ([place]) 2010    She has a past surgical history that includes Cholecystectomy; Ankle surgery (Right); Leg Surgery; and Abdominal hysterectomy (2010).   Her family history includes Alcohol abuse in her maternal grandfather and paternal grandmother; Arthritis in her father and mother; Depression in her son; Drug abuse in her son; Heart attack in her paternal grandfather; Heart attack (age of onset: 40) in her father; Heart disease in her father and paternal grandfather; Hyperlipidemia in her father; Hypertension in her brother and mother; Stroke in her mother; Thyroid disease in her mother.She reports that she has never smoked. She has  never used smokeless tobacco. She reports that she drinks alcohol. She reports that she does not use drugs.  Current Outpatient Prescriptions on File Prior to Visit  Medication Sig Dispense Refill  . lisinopril-hydrochlorothiazide (PRINZIDE,ZESTORETIC) 10-12.5 MG tablet TAKE 1 TABLET DAILY 90 tablet 1  . omeprazole (PRILOSEC) 20 MG capsule TAKE 1 CAPSULE DAILY 90 capsule 1  . traZODone (DESYREL) 50 MG tablet TAKE ONE TO TWO TABLETS BY MOUTH AT BEDTIME AS NEEDED FOR SLEEP 60 tablet 3   No current facility-administered medications on file prior to visit.      Objective:  Objective  Physical Exam  Constitutional: She is oriented to person, place, and time. She appears well-developed and well-nourished.  HENT:  Head: Normocephalic and atraumatic.  Eyes: Conjunctivae and EOM are normal.  Neck: Normal range of motion. Neck supple. No JVD present. Carotid bruit is not present. No thyromegaly present.  Cardiovascular: Normal rate and regular rhythm.   Murmur heard. Pulmonary/Chest: Effort normal and breath sounds normal. No respiratory distress. She has no wheezes. She has no rales. She exhibits no tenderness.  Musculoskeletal: She exhibits no edema.  Neurological: She is alert and oriented to person, place, and time.  Skin: Rash noted. Rash is maculopapular and vesicular.     Psychiatric: She has a normal mood and affect. Her behavior is normal. Judgment and thought content normal.  Nursing note and vitals reviewed.  BP 132/82   Pulse 79   Temp 98.4 F (36.9 C) (Oral)   Ht 5\' 3"  (1.6 m)   Wt 250 lb 4.8 oz (113.5 kg)   SpO2 99%   BMI 44.34 kg/m  Wt Readings from Last 3 Encounters:  08/23/16 250 lb  4.8 oz (113.5 kg)  05/31/16 246 lb 9.6 oz (111.9 kg)  05/11/16 242 lb 9.6 oz (110 kg)     Lab Results  Component Value Date   WBC 5.0 01/12/2016   HGB 13.5 01/12/2016   HCT 39.7 01/12/2016   PLT 255.0 01/12/2016   GLUCOSE 110 (H) 01/12/2016   CHOL 121 01/12/2016   TRIG 73.0  01/12/2016   HDL 45.90 01/12/2016   LDLCALC 61 01/12/2016   ALT 39 (H) 01/12/2016   AST 26 01/12/2016   NA 138 01/12/2016   K 3.0 (L) 01/12/2016   CL 104 01/12/2016   CREATININE 0.78 01/12/2016   BUN 8 01/12/2016   CO2 25 01/12/2016   TSH 2.22 01/12/2016   HGBA1C 5.8 12/24/2013    No results found.   Assessment & Plan:  Plan  I have discontinued Ms. Bos's cetirizine, fluticasone, potassium chloride SA, and metroNIDAZOLE. I am also having her start on mometasone. Additionally, I am having her maintain her traZODone, lisinopril-hydrochlorothiazide, and omeprazole.  Meds ordered this encounter  Medications  . mometasone (ELOCON) 0.1 % cream    Sig: Apply 1 application topically daily.    Dispense:  45 g    Refill:  0    Problem List Items Addressed This Visit      Unprioritized   HTN (hypertension)    Stable con't med      Palpitations - Primary    ekg-- nsr Check echo Consider monitor      Relevant Orders   EKG 12-Lead (Completed)   ECHOCARDIOGRAM COMPLETE    Other Visit Diagnoses    Allergic contact dermatitis due to plants, except food       Relevant Medications   mometasone (ELOCON) 0.1 % cream   Numbness and tingling in left hand       Undiagnosed cardiac murmurs       Relevant Orders   ECHOCARDIOGRAM COMPLETE      Follow-up: Return in about 6 months (around 02/22/2017) for annual exam, fasting.  Ann Held, DO

## 2016-08-23 NOTE — Patient Instructions (Signed)
Palpitations A palpitation is the feeling that your heartbeat is irregular or is faster than normal. It may feel like your heart is fluttering or skipping a beat. Palpitations are usually not a serious problem. They may be caused by many things, including smoking, caffeine, alcohol, stress, and certain medicines. Although most causes of palpitations are not serious, palpitations can be a sign of a serious medical problem. In some cases, you may need further medical evaluation. Follow these instructions at home: Pay attention to any changes in your symptoms. Take these actions to help with your condition:  Avoid the following: ? Caffeinated coffee, tea, soft drinks, diet pills, and energy drinks. ? Chocolate. ? Alcohol.  Do not use any tobacco products, such as cigarettes, chewing tobacco, and e-cigarettes. If you need help quitting, ask your health care provider.  Try to reduce your stress and anxiety. Things that can help you relax include: ? Yoga. ? Meditation. ? Physical activity, such as swimming, jogging, or walking. ? Biofeedback. This is a method that helps you learn to use your mind to control things in your body, such as your heartbeats.  Get plenty of rest and sleep.  Take over-the-counter and prescription medicines only as told by your health care provider.  Keep all follow-up visits as told by your health care provider. This is important.  Contact a health care provider if:  You continue to have a fast or irregular heartbeat after 24 hours.  Your palpitations occur more often. Get help right away if:  You have chest pain or shortness of breath.  You have a severe headache.  You feel dizzy or you faint. This information is not intended to replace advice given to you by your health care provider. Make sure you discuss any questions you have with your health care provider. Document Released: 02/10/2000 Document Revised: 07/18/2015 Document Reviewed: 10/28/2014 Elsevier  Interactive Patient Education  2017 [employer] Inc.  

## 2016-08-24 ENCOUNTER — Encounter: Payer: Self-pay | Admitting: Family Medicine

## 2016-08-24 DIAGNOSIS — R002 Palpitations: Secondary | ICD-10-CM | POA: Insufficient documentation

## 2016-08-24 NOTE — Assessment & Plan Note (Signed)
ekg-- nsr Check echo Consider monitor

## 2016-08-24 NOTE — Assessment & Plan Note (Signed)
Stable con't med 

## 2016-09-10 ENCOUNTER — Other Ambulatory Visit (HOSPITAL_COMMUNITY): Payer: Self-pay

## 2016-09-14 ENCOUNTER — Ambulatory Visit (HOSPITAL_COMMUNITY)
Admission: RE | Admit: 2016-09-14 | Discharge: 2016-09-14 | Disposition: A | Discharge status: Home / Self Care | Payer: 59 | Source: Ambulatory Visit | Attending: Family Medicine | Admitting: Family Medicine

## 2016-09-14 ENCOUNTER — Other Ambulatory Visit: Payer: Self-pay | Admitting: Family Medicine

## 2016-09-14 DIAGNOSIS — N6489 Other specified disorders of breast: Secondary | ICD-10-CM

## 2016-09-14 DIAGNOSIS — R011 Cardiac murmur, unspecified: Secondary | ICD-10-CM | POA: Diagnosis not present

## 2016-09-14 DIAGNOSIS — R002 Palpitations: Secondary | ICD-10-CM | POA: Insufficient documentation

## 2016-09-14 NOTE — Progress Notes (Signed)
  Echocardiogram 2D Echocardiogram has been performed.  Matilde Bash 09/14/2016, 11:01 AM

## 2016-10-12 ENCOUNTER — Ambulatory Visit
Admission: RE | Admit: 2016-10-12 | Discharge: 2016-10-12 | Disposition: A | Discharge status: Home / Self Care | Payer: 59 | Source: Ambulatory Visit | Attending: Family Medicine | Admitting: Family Medicine

## 2016-10-12 DIAGNOSIS — N6489 Other specified disorders of breast: Secondary | ICD-10-CM

## 2016-10-12 DIAGNOSIS — R928 Other abnormal and inconclusive findings on diagnostic imaging of breast: Secondary | ICD-10-CM | POA: Diagnosis not present

## 2016-10-25 ENCOUNTER — Other Ambulatory Visit: Payer: Self-pay | Admitting: Family Medicine

## 2016-10-25 DIAGNOSIS — G4700 Insomnia, unspecified: Secondary | ICD-10-CM

## 2016-11-06 ENCOUNTER — Encounter (INDEPENDENT_AMBULATORY_CARE_PROVIDER_SITE_OTHER): Payer: Self-pay

## 2016-11-29 ENCOUNTER — Encounter (INDEPENDENT_AMBULATORY_CARE_PROVIDER_SITE_OTHER): Payer: Self-pay | Admitting: Family Medicine

## 2016-11-29 ENCOUNTER — Ambulatory Visit (INDEPENDENT_AMBULATORY_CARE_PROVIDER_SITE_OTHER): Payer: 59 | Admitting: Family Medicine

## 2016-11-29 VITALS — BP 149/92 | HR 75 | Temp 98.0°F | Ht 62.0 in | Wt 246.0 lb

## 2016-11-29 DIAGNOSIS — Z6841 Body Mass Index (BMI) 40.0 and over, adult: Secondary | ICD-10-CM

## 2016-11-29 DIAGNOSIS — Z1331 Encounter for screening for depression: Secondary | ICD-10-CM | POA: Diagnosis not present

## 2016-11-29 DIAGNOSIS — Z0289 Encounter for other administrative examinations: Secondary | ICD-10-CM

## 2016-11-29 DIAGNOSIS — R0602 Shortness of breath: Secondary | ICD-10-CM | POA: Insufficient documentation

## 2016-11-29 DIAGNOSIS — Z9189 Other specified personal risk factors, not elsewhere classified: Secondary | ICD-10-CM | POA: Diagnosis not present

## 2016-11-29 DIAGNOSIS — R5383 Other fatigue: Secondary | ICD-10-CM | POA: Diagnosis not present

## 2016-11-29 DIAGNOSIS — I10 Essential (primary) hypertension: Secondary | ICD-10-CM | POA: Diagnosis not present

## 2016-11-29 NOTE — Progress Notes (Deleted)
ROS     Body mass index is 44.99 kg/m. Physical Exam  RECENT LABS AND TESTS: BMET    Component Value Date/Time   NA 138 01/12/2016 0952   K 3.0 (L) 01/12/2016 0952   CL 104 01/12/2016 0952   CO2 25 01/12/2016 0952   GLUCOSE 110 (H) 01/12/2016 0952   BUN 8 01/12/2016 0952   CREATININE 0.78 01/12/2016 0952   CALCIUM 7.9 (L) 01/12/2016 0952   GFRNONAA 74.43 06/24/2008 0935   Lab Results  Component Value Date   HGBA1C 5.8 12/24/2013   No results found for: INSULIN CBC    Component Value Date/Time   WBC 5.0 01/12/2016 0952   RBC 4.55 01/12/2016 0952   HGB 13.5 01/12/2016 0952   HCT 39.7 01/12/2016 0952   PLT 255.0 01/12/2016 0952   MCV 87.1 01/12/2016 0952   MCHC 34.1 01/12/2016 0952   RDW 13.6 01/12/2016 0952   LYMPHSABS 1.1 01/12/2016 0952   MONOABS 0.4 01/12/2016 0952   EOSABS 0.0 01/12/2016 0952   BASOSABS 0.0 01/12/2016 0952   Iron/TIBC/Ferritin/ %Sat No results found for: IRON, TIBC, FERRITIN, IRONPCTSAT Lipid Panel     Component Value Date/Time   CHOL 121 01/12/2016 0952   TRIG 73.0 01/12/2016 0952   HDL 45.90 01/12/2016 0952   CHOLHDL 3 01/12/2016 0952   VLDL 14.6 01/12/2016 0952   LDLCALC 61 01/12/2016 0952   Hepatic Function Panel     Component Value Date/Time   PROT 6.4 01/12/2016 0952   ALBUMIN 3.8 01/12/2016 0952   AST 26 01/12/2016 0952   ALT 39 (H) 01/12/2016 0952   ALKPHOS 77 01/12/2016 0952   BILITOT 0.5 01/12/2016 0952   BILIDIR 0.0 11/20/2013 0921      Component Value Date/Time   TSH 2.22 01/12/2016 0952   TSH 1.87 01/13/2015 1036   TSH 0.96 11/20/2013 0921    ECG  shows NSR with a rate of *** INDIRECT CALORIMETER done today shows a VO2 of *** and a REE of ***. Her calculated basal metabolic rate is *** thus her basal metabolic rate is {DESC; PPIRJJ/OACZY:60630} than expected.    ASSESSMENT AND PLAN: Other fatigue - Plan: EKG 12-Lead, Vitamin B12, CBC With Differential, Comprehensive metabolic panel, Folate,  Hemoglobin A1c, Insulin, random, Lipid Panel With LDL/HDL Ratio, T3, T4, free, TSH, VITAMIN D 25 Hydroxy (Vit-D Deficiency, Fractures)  Shortness of breath on exertion  Essential hypertension - Plan: Comprehensive metabolic panel, Lipid Panel With LDL/HDL Ratio  Depression screening  At risk for heart disease  Class 3 severe obesity with serious comorbidity and body mass index (BMI) of 45.0 to 49.9 in adult, unspecified obesity type (HCC)  PLAN:  Fatigue Dlisa was informed that her fatigue may be related to obesity, depression or many other causes. Labs will be ordered, and in the meanwhile Naraly has agreed to work on diet, exercise and weight loss to help with fatigue. Proper sleep hygiene was discussed including the need for 7-8 hours of quality sleep each night. A sleep study {WAS/WAS [phone]::"was not"} ordered based on symptoms and Epworth score.  Dyspnea on exertion Sullivan's shortness of breath appears to be obesity related and exercise induced. She has agreed to work on weight loss and gradually increase exercise to treat her exercise induced shortness of breath. If Tangelia follows our instructions and loses weight without improvement of her shortness of breath, we will plan to refer to pulmonology. We will monitor this condition regularly. Chanie agrees to this plan.  Depression  Screen Anaissa had a {mld/mod/mrk:311374} positive depression screening. Depression is commonly associated with obesity and often results in emotional eating behaviors. We will monitor this closely and work on CBT to help improve the non-hunger eating patterns. Referral to Psychology may be required if no improvement is seen as she continues in our clinic.  Risk counselling ***  Obesity Vaanya {CHL AMB IS/IS NOT:210130109} currently in the action stage of change and her goal is to {MWMwtloss#1:210800005} She has agreed to {MWMwtlossportion/plan#2:210800006} Aala has been instructed to work up to a goal of 150 minutes  of combined cardio and strengthening exercise per week for weight loss and overall health benefits. We discussed the following Behavioral Modification Stratagies today: {MWMwtlossdietstrategies#3:210800007}  Mariem has agreed to follow up with our clinic in {NUMBER 1-10:22536} weeks. She was informed of the importance of frequent follow up visits to maximize her success with intensive lifestyle modifications for her multiple health conditions. She was informed we would discuss her lab results at her next visit unless there is a critical issue that needs to be addressed sooner. Sharnetta agreed to keep her next visit at the agreed upon time to discuss these results.  I, ***, am acting as transcriptionist for Dennard Nip, MD  I have reviewed the above documentation for accuracy and completeness, and I agree with the above. -Dennard Nip, MD

## 2016-11-29 NOTE — Progress Notes (Signed)
Office: [phone]  /  Fax: [phone]   Dear Dr Cheri Rous  Thank you for referring Kristi Ramos to our clinic. The following note includes my evaluation and treatment recommendations.  HPI:   Chief Complaint: OBESITY    Kristi Ramos has been referred by Dr Cheri Rous for consultation regarding her obesity and obesity related comorbidities.    Kristi Ramos (MR# [id number]) is a 46 y.o. female who presents on 11/29/2016 for obesity evaluation and treatment. Current BMI is Body mass index is 44.99 kg/m.Marland Kitchen Kristi Ramos has been struggling with her weight for many years and has been unsuccessful in either losing weight, maintaining weight loss, or reaching her healthy weight goal.     Kristi Ramos attended our information session and states she is currently in the action stage of change and ready to dedicate time achieving and maintaining a healthier weight. Kristi Ramos is interested in becoming our Kristi Ramos and working on intensive lifestyle modifications including (but not limited to) diet, exercise and weight loss.    Kristi Ramos states her desired weight loss is 200 she has been heavy most of  her life she started gaining weight when she met her husband in 2014 she snacks frequently in the evenings she frequently makes poor food choices she frequently eats larger portions than normal  she struggles with emotional eating    Fatigue Kristi Ramos feels her energy is lower than it should be. This has worsened with weight gain and has worsened recently. Kristi Ramos admits to daytime somnolence and  admits to waking up still tired. Kristi Ramos is at risk for obstructive sleep apnea. Patent has a history of symptoms of morning fatigue. Kristi Ramos generally gets 7 hours of sleep per night, and states they generally have generally restful sleep. Snoring is present. Apneic episodes is present. Epworth Sleepiness Score is 1  Dyspnea on exertion Kristi Ramos notes increasing shortness of breath with exercising and seems to be worsening over  time with weight gain. She notes getting out of breath sooner with activity than she used to. This has gotten worse recently. Kristi Ramos denies orthopnea.  Hypertension Kristi Ramos is a 46 y.o. female with hypertension.  Kristi Ramos denies chest pain or shortness of breath on exertion. She is working weight loss to help control her blood pressure with the goal of decreasing her risk of heart attack and stroke. Kims blood pressure is not currently controlled. Kristi Ramos currently on Lisinopril HCTZ.     Depression Screen Kristi Ramos (modified PHQ-9) score was  Depression screen PHQ 2/9 11/29/2016  Decreased Interest 3  Down, Depressed, Hopeless 1  PHQ - 2 Score 4  Altered sleeping 1  Tired, decreased energy 3  Change in appetite 2  Feeling bad or failure about yourself  0  Trouble concentrating 0  Moving slowly or fidgety/restless 1  Suicidal thoughts 0  PHQ-9 Score 11   At risk for cardiovascular disease Kerin is at a higher than average risk for cardiovascular disease due to obesity. She currently denies any chest pain.  ALLERGIES: Allergies  Allergen Reactions  . Moxifloxacin Palpitations and Rash  . Augmentin [Amoxicillin-Pot Clavulanate] Other (See Comments)    tachycardia    MEDICATIONS: Current Outpatient Prescriptions on File Prior to Visit  Medication Sig Dispense Refill  . lisinopril-hydrochlorothiazide (PRINZIDE,ZESTORETIC) 10-12.5 MG tablet TAKE 1 TABLET DAILY 90 tablet 1  . omeprazole (PRILOSEC) 20 MG capsule TAKE 1 CAPSULE DAILY 90 capsule 1  . traZODone (DESYREL) 50 MG tablet TAKE 1 TO 2 TABLETS BY  MOUTH AT BEDTIME AS NEEDED FOR SLEEP 60 tablet 3   No current facility-administered medications on file prior to visit.     PAST MEDICAL HISTORY: Past Medical History:  Diagnosis Date  . ADD (attention deficit disorder)   . Anxiety   . Back pain   . Cervical cancer ([place]) 2010  . Chicken pox   . Depression 2009  . Gallbladder problem   .  GERD (gastroesophageal reflux disease)   . HTN (hypertension)   . Seasonal allergies   . Uterine cancer ([place]) 2010    PAST SURGICAL HISTORY: Past Surgical History:  Procedure Laterality Date  . ABDOMINAL HYSTERECTOMY  2010   TAH-- cancer  . ANKLE SURGERY Right    Osteomyelitis  . CHOLECYSTECTOMY    . LEG SURGERY     Calf Muscle Surgery    SOCIAL HISTORY: Social History  Substance Use Topics  . Smoking status: Never Smoker  . Smokeless tobacco: Never Used  . Alcohol use Yes     Comment: Occ    FAMILY HISTORY: Family History  Problem Relation Age of Onset  . Arthritis Mother   . Stroke Mother   . Hypertension Mother   . Thyroid disease Mother   . Anxiety disorder Mother   . Arthritis Father   . Hyperlipidemia Father   . Heart disease Father   . Heart attack Father 61  . Hypertension Brother   . Drug abuse Son   . Alcohol abuse Paternal Grandmother   . Alcohol abuse Maternal Grandfather   . Heart disease Paternal Grandfather   . Heart attack Paternal Grandfather   . Depression Son     ROS: Review of Systems  Constitutional:       Fatigue  Eyes:       Vision changes  Cardiovascular:       SOBOE  Gastrointestinal: Positive for heartburn.  Musculoskeletal: Positive for back pain.  Psychiatric/Behavioral: Positive for depression. The Kristi Ramos is nervous/anxious.        Stress  All other systems reviewed and are negative.   PHYSICAL EXAM: Blood pressure (!) 149/92, pulse 75, temperature 98 F (36.7 C), temperature source Oral, height 5' 2"  (1.575 m), weight 246 lb (111.6 kg), SpO2 98 %. Body mass index is 44.99 kg/m. Physical Exam  Constitutional: She is oriented to person, place, and time. She appears well-developed and well-nourished.  HENT:  Head: Normocephalic and atraumatic.  Eyes: Pupils are equal, round, and reactive to light. EOM are normal.  Neck: Normal range of motion. Neck supple.  Cardiovascular: Normal rate and regular rhythm.   Murmur  (Early systolic murmur) heard. Pulmonary/Chest: Effort normal and breath sounds normal.  Abdominal: Soft.  Musculoskeletal: Normal range of motion.  Neurological: She is alert and oriented to person, place, and time.  Skin: Skin is warm and dry.  Psychiatric: She has a normal Ramos and affect. Her behavior is normal.  Vitals reviewed.   RECENT LABS AND TESTS: BMET    Component Value Date/Time   NA 138 01/12/2016 0952   K 3.0 (L) 01/12/2016 0952   CL 104 01/12/2016 0952   CO2 25 01/12/2016 0952   GLUCOSE 110 (H) 01/12/2016 0952   BUN 8 01/12/2016 0952   CREATININE 0.78 01/12/2016 0952   CALCIUM 7.9 (L) 01/12/2016 0952   GFRNONAA 74.43 06/24/2008 0935   Lab Results  Component Value Date   HGBA1C 5.8 12/24/2013   No results found for: INSULIN CBC    Component Value Date/Time  WBC 5.0 01/12/2016 0952   RBC 4.55 01/12/2016 0952   HGB 13.5 01/12/2016 0952   HCT 39.7 01/12/2016 0952   PLT 255.0 01/12/2016 0952   MCV 87.1 01/12/2016 0952   MCHC 34.1 01/12/2016 0952   RDW 13.6 01/12/2016 0952   LYMPHSABS 1.1 01/12/2016 0952   MONOABS 0.4 01/12/2016 0952   EOSABS 0.0 01/12/2016 0952   BASOSABS 0.0 01/12/2016 0952   Iron/TIBC/Ferritin/ %Sat No results found for: IRON, TIBC, FERRITIN, IRONPCTSAT Lipid Panel     Component Value Date/Time   CHOL 121 01/12/2016 0952   TRIG 73.0 01/12/2016 0952   HDL 45.90 01/12/2016 0952   CHOLHDL 3 01/12/2016 0952   VLDL 14.6 01/12/2016 0952   LDLCALC 61 01/12/2016 0952   Hepatic Function Panel     Component Value Date/Time   PROT 6.4 01/12/2016 0952   ALBUMIN 3.8 01/12/2016 0952   AST 26 01/12/2016 0952   ALT 39 (H) 01/12/2016 0952   ALKPHOS 77 01/12/2016 0952   BILITOT 0.5 01/12/2016 0952   BILIDIR 0.0 11/20/2013 0921      Component Value Date/Time   TSH 2.22 01/12/2016 0952   TSH 1.87 01/13/2015 1036   TSH 0.96 11/20/2013 0921    ECG  shows NSR with a rate of 80 INDIRECT CALORIMETER done today shows a VO2 of 144 and a  REE of 1002.  Her calculated basal metabolic rate is 0160 thus her basal metabolic rate is worse than expected.    ASSESSMENT AND PLAN: Other fatigue - Plan: EKG 12-Lead, Vitamin B12, CBC With Differential, Comprehensive metabolic panel, Folate, Hemoglobin A1c, Insulin, random, Lipid Panel With LDL/HDL Ratio, T3, T4, free, TSH, VITAMIN D 25 Hydroxy (Vit-D Deficiency, Fractures)  Shortness of breath on exertion  Essential hypertension - Plan: Comprehensive metabolic panel, Lipid Panel With LDL/HDL Ratio  Depression screening  At risk for heart disease  Class 3 severe obesity with serious comorbidity and body mass index (BMI) of 45.0 to 49.9 in adult, unspecified obesity type (HCC)  PLAN: Fatigue Kristi Ramos was informed that her fatigue may be related to obesity, depression or many other causes. Labs will be ordered, and in the meanwhile Kristi Ramos has agreed to work on diet, exercise and weight loss to help with fatigue. Proper sleep hygiene was discussed including the need for 7-8 hours of quality sleep each night. A sleep study was not ordered based on symptoms and Epworth score.  Dyspnea on exertion Kristi Ramos's shortness of breath appears to be obesity related and exercise induced. She has agreed to work on weight loss and gradually increase exercise to treat her exercise induced shortness of breath. If Kristi Ramos follows our instructions and loses weight without improvement of her shortness of breath, we will plan to refer to pulmonology. We will monitor this condition regularly. Kristi Ramos agrees to this plan.  Hypertension We discussed sodium restriction, working on healthy weight loss, and a regular exercise program as the means to achieve improved blood pressure control. Kristi Ramos agreed with this plan and agreed to follow up as directed. We will continue to monitor her blood pressure as well as her progress with the above lifestyle modifications. She will continue her medications as prescribed and will watch for signs of  hypotension as she continues her lifestyle modifications. Will check labs, continue meds and recheck bp in 2 weeks   Depression Screen Kristi Ramos had a moderately positive depression screening. Depression is commonly associated with obesity and often results in emotional eating behaviors. We will monitor this closely and  work on CBT to help improve the non-hunger eating patterns. Referral to Psychology may be required if no improvement is seen as she continues in our clinic.  Cardiovascular risk counselling Kristi Ramos was given extended (15 minutes) coronary artery disease prevention counseling today. She is 46 y.o. female and has risk factors for heart disease including obesity. We discussed intensive lifestyle modifications today with an emphasis on specific weight loss instructions and strategies. Pt was also informed of the importance of increasing exercise and decreasing saturated fats to help prevent heart disease.   Obesity Kristi Ramos is currently in the action stage of change and her goal is to continue with weight loss efforts. I recommend Maille begin the structured treatment plan as follows:  She has agreed to follow the Category 2 plan Yamari has been instructed to eventually work up to a goal of 150 minutes of combined cardio and strengthening exercise per week for weight loss and overall health benefits. We discussed the following Behavioral Modification Stratagies today: increasing lean protein intake and decreasing simple carbohydrates    She was informed of the importance of frequent follow up visits to maximize her success with intensive lifestyle modifications for her multiple health conditions. She was informed we would discuss her lab results at her next visit unless there is a critical issue that needs to be addressed sooner. Setareh agreed to keep her next visit at the agreed upon time to discuss these results.  I, April Moore, am acting as Location manager for  Dennard Nip, MD  I have reviewed the  above documentation for accuracy and completeness, and I agree with the above. -Dennard Nip, MD   Office: [phone]  /  Fax: [phone]  OBESITY BEHAVIORAL INTERVENTION VISIT  Today's visit was # 1 out of 22.  Starting weight: 246 Starting date: 11/29/16 Today's weight : Weight: 246 lb (111.6 kg)  Today's date: 12/04/2016 Total lbs lost to date: 0 (Patients must lose 7 lbs in the first 6 months to continue with counseling)   ASK: We discussed the diagnosis of obesity with Kristi Ramos today and Aina agreed to give [place] permission to discuss obesity behavioral modification therapy today.  ASSESS: Sophina has the diagnosis of obesity and her BMI today is @TBMI @ Nisa is in the action stage of change   ADVISE: Kaysha was educated on the multiple health risks of obesity as well as the benefit of weight loss to improve her health. She was advised of the need for long term treatment and the importance of lifestyle modifications.  AGREE: Multiple dietary modification options and treatment options were discussed and  Hunter agreed to follow the Category 2 plan We discussed the following Behavioral Modification Stratagies today: increasing lean protein intake and decreasing simple carbohydrates

## 2016-11-30 LAB — COMPREHENSIVE METABOLIC PANEL WITH GFR
ALT: 68 IU/L — ABNORMAL HIGH (ref 0–32)
AST: 47 IU/L — ABNORMAL HIGH (ref 0–40)
Albumin/Globulin Ratio: 2.2 (ref 1.2–2.2)
Albumin: 4.3 g/dL (ref 3.5–5.5)
Alkaline Phosphatase: 75 IU/L (ref 39–117)
BUN/Creatinine Ratio: 15 (ref 9–23)
BUN: 10 mg/dL (ref 6–24)
Bilirubin Total: 0.2 mg/dL (ref 0.0–1.2)
CO2: 24 mmol/L (ref 20–29)
Calcium: 8.5 mg/dL — ABNORMAL LOW (ref 8.7–10.2)
Chloride: 102 mmol/L (ref 96–106)
Creatinine, Ser: 0.68 mg/dL (ref 0.57–1.00)
GFR calc Af Amer: 121 mL/min/1.73
GFR calc non Af Amer: 105 mL/min/1.73
Globulin, Total: 2 g/dL (ref 1.5–4.5)
Glucose: 110 mg/dL — ABNORMAL HIGH (ref 65–99)
Potassium: 4.2 mmol/L (ref 3.5–5.2)
Sodium: 141 mmol/L (ref 134–144)
Total Protein: 6.3 g/dL (ref 6.0–8.5)

## 2016-11-30 LAB — LIPID PANEL WITH LDL/HDL RATIO
CHOLESTEROL TOTAL: 156 mg/dL (ref 100–199)
HDL: 53 mg/dL (ref >39)
LDL CALC: 92 mg/dL (ref 0–99)
LDL/HDL RATIO: 1.7 ratio (ref 0.0–3.2)
TRIGLYCERIDES: 53 mg/dL (ref 0–149)
VLDL CHOLESTEROL CAL: 11 mg/dL (ref 5–40)

## 2016-11-30 LAB — VITAMIN D 25 HYDROXY (VIT D DEFICIENCY, FRACTURES): Vit D, 25-Hydroxy: 31.7 ng/mL (ref 30.0–100.0)

## 2016-11-30 LAB — VITAMIN B12: Vitamin B-12: 416 pg/mL (ref 232–1245)

## 2016-11-30 LAB — FOLATE: Folate: 12.1 ng/mL

## 2016-11-30 LAB — CBC WITH DIFFERENTIAL
BASOS: 0 %
Basophils Absolute: 0 10*3/uL (ref 0.0–0.2)
EOS (ABSOLUTE): 0.1 10*3/uL (ref 0.0–0.4)
EOS: 1 %
HEMATOCRIT: 38.2 % (ref 34.0–46.6)
HEMOGLOBIN: 12.4 g/dL (ref 11.1–15.9)
IMMATURE GRANULOCYTES: 0 %
Immature Grans (Abs): 0 10*3/uL (ref 0.0–0.1)
LYMPHS: 14 %
Lymphocytes Absolute: 1.1 10*3/uL (ref 0.7–3.1)
MCH: 30 pg (ref 26.6–33.0)
MCHC: 32.5 g/dL (ref 31.5–35.7)
MCV: 93 fL (ref 79–97)
MONOCYTES: 5 %
MONOS ABS: 0.4 10*3/uL (ref 0.1–0.9)
Neutrophils Absolute: 6.4 10*3/uL (ref 1.4–7.0)
Neutrophils: 80 %
RBC: 4.13 x10E6/uL (ref 3.77–5.28)
RDW: 13.7 % (ref 12.3–15.4)
WBC: 8.1 10*3/uL (ref 3.4–10.8)

## 2016-11-30 LAB — T4, FREE: Free T4: 1.16 ng/dL (ref 0.82–1.77)

## 2016-11-30 LAB — HEMOGLOBIN A1C
Est. average glucose Bld gHb Est-mCnc: 114 mg/dL
Hgb A1c MFr Bld: 5.6 % (ref 4.8–5.6)

## 2016-11-30 LAB — INSULIN, RANDOM: INSULIN: 15.9 u[IU]/mL (ref 2.6–24.9)

## 2016-11-30 LAB — T3: T3, Total: 166 ng/dL (ref 71–180)

## 2016-11-30 LAB — TSH: TSH: 1.71 u[IU]/mL (ref 0.450–4.500)

## 2016-12-03 ENCOUNTER — Telehealth: Payer: Self-pay | Admitting: Family Medicine

## 2016-12-03 DIAGNOSIS — I10 Essential (primary) hypertension: Secondary | ICD-10-CM

## 2016-12-03 MED ORDER — LISINOPRIL-HYDROCHLOROTHIAZIDE 10-12.5 MG PO TABS: 1.0000 | ORAL_TABLET | Freq: Every day | ORAL | 1 refills | Status: DC

## 2016-12-03 NOTE — Telephone Encounter (Signed)
Refill done/patient notified.

## 2016-12-03 NOTE — Telephone Encounter (Signed)
lisinopril needs refill. bottle says no refills. # 10 pills left. call to [facility] [address] per pt request.

## 2016-12-13 ENCOUNTER — Ambulatory Visit (INDEPENDENT_AMBULATORY_CARE_PROVIDER_SITE_OTHER): Payer: 59 | Admitting: Family Medicine

## 2016-12-13 VITALS — BP 140/85 | HR 63 | Temp 98.1°F | Ht 62.0 in | Wt 238.0 lb

## 2016-12-13 DIAGNOSIS — E559 Vitamin D deficiency, unspecified: Secondary | ICD-10-CM | POA: Diagnosis not present

## 2016-12-13 DIAGNOSIS — R7303 Prediabetes: Secondary | ICD-10-CM | POA: Insufficient documentation

## 2016-12-13 DIAGNOSIS — R945 Abnormal results of liver function studies: Secondary | ICD-10-CM | POA: Diagnosis not present

## 2016-12-13 DIAGNOSIS — Z9189 Other specified personal risk factors, not elsewhere classified: Secondary | ICD-10-CM | POA: Diagnosis not present

## 2016-12-13 DIAGNOSIS — Z6841 Body Mass Index (BMI) 40.0 and over, adult: Secondary | ICD-10-CM | POA: Diagnosis not present

## 2016-12-13 DIAGNOSIS — I10 Essential (primary) hypertension: Secondary | ICD-10-CM

## 2016-12-13 DIAGNOSIS — R7989 Other specified abnormal findings of blood chemistry: Secondary | ICD-10-CM | POA: Insufficient documentation

## 2016-12-13 MED ORDER — VITAMIN D (ERGOCALCIFEROL) 1.25 MG (50000 UNIT) PO CAPS: 50000.0000 [IU] | ORAL_CAPSULE | ORAL | 0 refills | Status: DC

## 2016-12-13 NOTE — Progress Notes (Signed)
Office: [phone]  /  Fax: [phone]   HPI:   Chief Complaint: OBESITY Kristi Ramos is here to discuss her progress with her obesity treatment plan. She is on the Category 2 plan and is following her eating plan approximately 99 % of the time. She states she is exercising 0 minutes 0 times per week. Kristi Ramos has done well with weight loss on the category 2 plan. She noted some polyphagia in the afternoons. Her weight is 238 lb (108 kg) today and has had a weight loss of 8 pounds over a period of 2 weeks since her last visit. She has lost 8 lbs since starting treatment with [place].  Vitamin D deficiency Kristi Ramos has a new diagnosis of vitamin D deficiency. She is not currently taking vit D and her vitamin D level is not yet in the ideal range. She admits fatigue but denies nausea, vomiting or muscle weakness.  Elevated LFT Kristi Ramos has a new dx of mildly elevated AST and ALT. Her BMI is over 40. She denies abdominal pain or jaundice and has never been told of any liver problems in the past. She is a moderate drinker but not in the last 2 weeks on diet prescription.  Pre-Diabetes Kristi Ramos has a diagnosis of pre-diabetes with elevated fasting glucose and fasting insulin and was informed this puts her at greater risk of developing diabetes. She is not taking metformin currently and continues to work on diet and exercise to decrease risk of diabetes. She admits polyphagia, but this has improved on diet prescription. She denies nausea or hypoglycemia.  At risk for diabetes Kristi Ramos is at higher than average risk for developing diabetes due to her obesity and pre-diabetes. She currently denies polyuria or polydipsia.  Hypertension Kristi Ramos is a 46 y.o. female with hypertension. She is on lisinopril/HCTZ and her blood pressure is still slightly elevated, but has improved in the last 2 weeks with diet and exercise.  Kristi Ramos denies chest pain or shortness of breath on exertion. She is working weight loss  to help control her blood pressure with the goal of decreasing her risk of heart attack and stroke. Kristi Ramos blood pressure is not currently controlled.   ALLERGIES: Allergies  Allergen Reactions  . Moxifloxacin Palpitations and Rash  . Augmentin [Amoxicillin-Pot Clavulanate] Other (See Comments)    tachycardia    MEDICATIONS: Current Outpatient Prescriptions on File Prior to Visit  Medication Sig Dispense Refill  . famotidine (PEPCID) 20 MG tablet Take 20 mg by mouth 2 (two) times daily as needed for heartburn or indigestion.    Marland Kitchen ibuprofen (ADVIL,MOTRIN) 200 MG tablet Take 200 mg by mouth every 6 (six) hours as needed.    Marland Kitchen lisinopril-hydrochlorothiazide (PRINZIDE,ZESTORETIC) 10-12.5 MG tablet Take 1 tablet by mouth daily. 90 tablet 1  . omeprazole (PRILOSEC) 20 MG capsule TAKE 1 CAPSULE DAILY 90 capsule 1  . traZODone (DESYREL) 50 MG tablet TAKE 1 TO 2 TABLETS BY MOUTH AT BEDTIME AS NEEDED FOR SLEEP 60 tablet 3   No current facility-administered medications on file prior to visit.     PAST MEDICAL HISTORY: Past Medical History:  Diagnosis Date  . ADD (attention deficit disorder)   . Anxiety   . Back pain   . Cervical cancer ([place]) 2010  . Chicken pox   . Depression 2009  . Gallbladder problem   . GERD (gastroesophageal reflux disease)   . HTN (hypertension)   . Seasonal allergies   . Uterine cancer ([place]) 2010  PAST SURGICAL HISTORY: Past Surgical History:  Procedure Laterality Date  . ABDOMINAL HYSTERECTOMY  2010   TAH-- cancer  . ANKLE SURGERY Right    Osteomyelitis  . CHOLECYSTECTOMY    . LEG SURGERY     Calf Muscle Surgery    SOCIAL HISTORY: Social History  Substance Use Topics  . Smoking status: Never Smoker  . Smokeless tobacco: Never Used  . Alcohol use Yes     Comment: Occ    FAMILY HISTORY: Family History  Problem Relation Age of Onset  . Arthritis Mother   . Stroke Mother   . Hypertension Mother   . Thyroid disease Mother   . Anxiety  disorder Mother   . Arthritis Father   . Hyperlipidemia Father   . Heart disease Father   . Heart attack Father 48  . Hypertension Brother   . Drug abuse Son   . Alcohol abuse Paternal Grandmother   . Alcohol abuse Maternal Grandfather   . Heart disease Paternal Grandfather   . Heart attack Paternal Grandfather   . Depression Son     ROS: Review of Systems  Constitutional: Positive for malaise/fatigue and weight loss.  Respiratory: Negative for shortness of breath (on exertion).   Cardiovascular: Negative for chest pain.  Gastrointestinal: Negative for heartburn and nausea.  Genitourinary: Negative for frequency.  Musculoskeletal:       Negative muscle weakness  Endo/Heme/Allergies: Negative for polydipsia.       Positive polyphagia Negative hypoglycemia    PHYSICAL EXAM: Blood pressure 140/85, pulse 63, temperature 98.1 F (36.7 C), temperature source Oral, height 5\' 2"  (1.575 m), weight 238 lb (108 kg), SpO2 99 %. Body mass index is 43.53 kg/m. Physical Exam  Constitutional: She is oriented to person, place, and time. She appears well-developed and well-nourished.  Cardiovascular: Normal rate.   Pulmonary/Chest: Effort normal.  Musculoskeletal: Normal range of motion.  Neurological: She is oriented to person, place, and time.  Skin: Skin is warm and dry.  Psychiatric: She has a normal mood and affect. Her behavior is normal.  Vitals reviewed.   RECENT LABS AND TESTS: BMET    Component Value Date/Time   NA 141 11/29/2016 0945   K 4.2 11/29/2016 0945   CL 102 11/29/2016 0945   CO2 24 11/29/2016 0945   GLUCOSE 110 (H) 11/29/2016 0945   GLUCOSE 110 (H) 01/12/2016 0952   BUN 10 11/29/2016 0945   CREATININE 0.68 11/29/2016 0945   CALCIUM 8.5 (L) 11/29/2016 0945   GFRNONAA 105 11/29/2016 0945   GFRAA 121 11/29/2016 0945   Lab Results  Component Value Date   HGBA1C 5.6 11/29/2016   HGBA1C 5.8 12/24/2013   Lab Results  Component Value Date   INSULIN 15.9  11/29/2016   CBC    Component Value Date/Time   WBC 8.1 11/29/2016 0945   WBC 5.0 01/12/2016 0952   RBC 4.13 11/29/2016 0945   RBC 4.55 01/12/2016 0952   HGB 12.4 11/29/2016 0945   HCT 38.2 11/29/2016 0945   PLT 255.0 01/12/2016 0952   MCV 93 11/29/2016 0945   MCH 30.0 11/29/2016 0945   MCHC 32.5 11/29/2016 0945   MCHC 34.1 01/12/2016 0952   RDW 13.7 11/29/2016 0945   LYMPHSABS 1.1 11/29/2016 0945   MONOABS 0.4 01/12/2016 0952   EOSABS 0.1 11/29/2016 0945   BASOSABS 0.0 11/29/2016 0945   Iron/TIBC/Ferritin/ %Sat No results found for: IRON, TIBC, FERRITIN, IRONPCTSAT Lipid Panel     Component Value Date/Time   CHOL  156 11/29/2016 0945   TRIG 53 11/29/2016 0945   HDL 53 11/29/2016 0945   CHOLHDL 3 01/12/2016 0952   VLDL 14.6 01/12/2016 0952   LDLCALC 92 11/29/2016 0945   Hepatic Function Panel     Component Value Date/Time   PROT 6.3 11/29/2016 0945   ALBUMIN 4.3 11/29/2016 0945   AST 47 (H) 11/29/2016 0945   ALT 68 (H) 11/29/2016 0945   ALKPHOS 75 11/29/2016 0945   BILITOT 0.2 11/29/2016 0945   BILIDIR 0.0 11/20/2013 0921      Component Value Date/Time   TSH 1.710 11/29/2016 0945   TSH 2.22 01/12/2016 0952   TSH 1.87 01/13/2015 1036    ASSESSMENT AND PLAN: Vitamin D deficiency - Plan: Vitamin D, Ergocalciferol, (DRISDOL) 50000 units CAPS capsule  Elevated LFTs  Prediabetes  Essential hypertension  At risk for diabetes mellitus  Class 3 severe obesity with serious comorbidity and body mass index (BMI) of 40.0 to 44.9 in adult, unspecified obesity type ([place])  PLAN:  Vitamin D Deficiency Kristi Ramos was informed that low vitamin D levels contributes to fatigue and are associated with obesity, breast, and colon cancer. She agrees to start to take prescription Vit D @50 ,000 IU every week #4 with no refills and will follow up for routine testing of vitamin D, at least 2-3 times per year. She was informed of the risk of over-replacement of vitamin D and agrees to  not increase her dose unless he discusses this with [place] first. Kristi Ramos agrees to follow up with our clinic in 2 weeks.  Elevated LFT We discussed the likely diagnosis of non alcoholic fatty liver disease today and how this condition is obesity related. Kristi Ramos was educated on her risk of developing NASH or even liver failure and the only proven treatment for NAFLD was weight loss. Kristi Ramos agreed to continue with her weight loss efforts with healthier diet and exercise as an essential part of her treatment plan. We will recheck labs in 3 months and she will follow up at the agreed upon time.  Pre-Diabetes Kristi Ramos will continue to work on weight loss, exercise, and decreasing simple carbohydrates in her diet to help decrease the risk of diabetes. We dicussed metformin including benefits and risks. She was informed that eating too many simple carbohydrates or too many calories at one sitting increases the likelihood of GI side effects. We will defer metformin for now and a prescription was not written today. We will recheck labs in 3 months and Kristi Ramos agreed to follow up with [place] as directed to monitor her progress.  Diabetes risk counseling Kristi Ramos was given extended (30 minutes) diabetes prevention counseling today. She is 46 y.o. female and has risk factors for diabetes including obesity and pre-diabetes. We discussed intensive lifestyle modifications today with an emphasis on weight loss as well as increasing exercise and decreasing simple carbohydrates in her diet.  Hypertension We discussed sodium restriction, working on healthy weight loss, and a regular exercise program as the means to achieve improved blood pressure control. Kristi Ramos agreed with this plan and agreed to follow up as directed. We will recheck blood pressure in 2 weeks and will continue to monitor her blood pressure as well as her progress with the above lifestyle modifications. She will continue her medications as prescribed and will watch for signs of  hypotension as she continues her lifestyle modifications.  Obesity Kristi Ramos is currently in the action stage of change. As such, her goal is to continue with weight loss efforts She  has agreed to follow the Category 2 plan +100 calories Kristi Ramos has been instructed to work up to a goal of 150 minutes of combined cardio and strengthening exercise per week for weight loss and overall health benefits. We discussed the following Behavioral Modification Strategies today: increasing lean protein intake, decreasing simple carbohydrates  and work on meal planning and easy cooking plans  Kristi Ramos has agreed to follow up with our clinic in 2 weeks. She was informed of the importance of frequent follow up visits to maximize her success with intensive lifestyle modifications for her multiple health conditions.  I, Kristi Ramos, am acting as transcriptionist for Kristi Nip, MD  I have reviewed the above documentation for accuracy and completeness, and I agree with the above. -Kristi Nip, MD    OBESITY BEHAVIORAL INTERVENTION VISIT  Today's visit was # 2 out of 16.  Starting weight: 246 lbs Starting date: 11/29/16 Today's weight : 238 lbs  Today's date: 12/13/2016 Total lbs lost to date: 8 (Patients must lose 7 lbs in the first 6 months to continue with counseling)   ASK: We discussed the diagnosis of obesity with Kristi Ramos today and Kristi Ramos agreed to give [place] permission to discuss obesity behavioral modification therapy today.  ASSESS: Kristi Ramos has the diagnosis of obesity and her BMI today is 43.52 Kristi Ramos is in the action stage of change   ADVISE: Shakina was educated on the multiple health risks of obesity as well as the benefit of weight loss to improve her health. She was advised of the need for long term treatment and the importance of lifestyle modifications.  AGREE: Multiple dietary modification options and treatment options were discussed and Hayzel agreed to follow the Category 2 plan +100  calories We discussed the following Behavioral Modification Strategies today: increasing lean protein intake, decreasing simple carbohydrates  and work on meal planning and easy cooking plans

## 2016-12-25 ENCOUNTER — Other Ambulatory Visit: Payer: Self-pay | Admitting: Family Medicine

## 2016-12-25 DIAGNOSIS — K219 Gastro-esophageal reflux disease without esophagitis: Secondary | ICD-10-CM

## 2016-12-27 ENCOUNTER — Ambulatory Visit (INDEPENDENT_AMBULATORY_CARE_PROVIDER_SITE_OTHER): Payer: 59 | Admitting: Family Medicine

## 2016-12-27 VITALS — BP 130/79 | HR 67 | Temp 98.1°F | Ht 62.0 in | Wt 234.0 lb

## 2016-12-27 DIAGNOSIS — Z6841 Body Mass Index (BMI) 40.0 and over, adult: Secondary | ICD-10-CM

## 2016-12-27 DIAGNOSIS — E8881 Metabolic syndrome: Secondary | ICD-10-CM | POA: Diagnosis not present

## 2016-12-27 NOTE — Progress Notes (Signed)
Office: [phone]  /  Fax: [phone]   HPI:   Chief Complaint: OBESITY Kristi Ramos is here to discuss her progress with her obesity treatment plan. She is on the Category 2 plan and is following her eating plan approximately 90 % of the time. She states she is exercising 0 minutes 0 times per week. Kristi Ramos continues to do well with weight loss on the category 2 plan. Hunger is better controlled and she is limiting her eating out but is making better more mindful choices when she does. Her weight is 234 lb (106.1 kg) today and has had a weight loss of 4 pounds over a period of 2 weeks since her last visit. She has lost 12 lbs since starting treatment with [place].  Insulin Resistance Kristi Ramos has a diagnosis of insulin resistance based on her elevated fasting insulin level >5. Although Kristi Ramos's blood glucose readings are still under good control, insulin resistance puts her at greater risk of metabolic syndrome and diabetes. She is not taking metformin currently and she is working on controlling her insulin resistance with diet. She is making good food choices and is working on increasing vegetables. Hunger is better controlled. Kristi Ramos continues to work on diet and exercise to decrease risk of diabetes.   ALLERGIES: Allergies  Allergen Reactions  . Moxifloxacin Palpitations and Rash  . Augmentin [Amoxicillin-Pot Clavulanate] Other (See Comments)    tachycardia    MEDICATIONS: Current Outpatient Prescriptions on File Prior to Visit  Medication Sig Dispense Refill  . famotidine (PEPCID) 20 MG tablet Take 20 mg by mouth 2 (two) times daily as needed for heartburn or indigestion.    Marland Kitchen ibuprofen (ADVIL,MOTRIN) 200 MG tablet Take 200 mg by mouth every 6 (six) hours as needed.    Marland Kitchen lisinopril-hydrochlorothiazide (PRINZIDE,ZESTORETIC) 10-12.5 MG tablet Take 1 tablet by mouth daily. 90 tablet 1  . omeprazole (PRILOSEC) 20 MG capsule TAKE 1 CAPSULE DAILY 90 capsule 1  . traZODone (DESYREL) 50 MG tablet TAKE 1 TO 2  TABLETS BY MOUTH AT BEDTIME AS NEEDED FOR SLEEP 60 tablet 3  . Vitamin D, Ergocalciferol, (DRISDOL) 50000 units CAPS capsule Take 1 capsule (50,000 Units total) by mouth every 7 (seven) days. 4 capsule 0   No current facility-administered medications on file prior to visit.     PAST MEDICAL HISTORY: Past Medical History:  Diagnosis Date  . ADD (attention deficit disorder)   . Anxiety   . Back pain   . Cervical cancer ([place]) 2010  . Chicken pox   . Depression 2009  . Gallbladder problem   . GERD (gastroesophageal reflux disease)   . HTN (hypertension)   . Seasonal allergies   . Uterine cancer ([place]) 2010    PAST SURGICAL HISTORY: Past Surgical History:  Procedure Laterality Date  . ABDOMINAL HYSTERECTOMY  2010   TAH-- cancer  . ANKLE SURGERY Right    Osteomyelitis  . CHOLECYSTECTOMY    . LEG SURGERY     Calf Muscle Surgery    SOCIAL HISTORY: Social History  Substance Use Topics  . Smoking status: Never Smoker  . Smokeless tobacco: Never Used  . Alcohol use Yes     Comment: Occ    FAMILY HISTORY: Family History  Problem Relation Age of Onset  . Arthritis Mother   . Stroke Mother   . Hypertension Mother   . Thyroid disease Mother   . Anxiety disorder Mother   . Arthritis Father   . Hyperlipidemia Father   . Heart disease Father   .  Heart attack Father 43  . Hypertension Brother   . Drug abuse Son   . Alcohol abuse Paternal Grandmother   . Alcohol abuse Maternal Grandfather   . Heart disease Paternal Grandfather   . Heart attack Paternal Grandfather   . Depression Son     ROS: Review of Systems  Constitutional: Positive for weight loss.    PHYSICAL EXAM: Blood pressure 130/79, pulse 67, temperature 98.1 F (36.7 C), temperature source Oral, height 5\' 2"  (1.575 m), weight 234 lb (106.1 kg), SpO2 99 %. Body mass index is 42.8 kg/m. Physical Exam  Constitutional: She is oriented to person, place, and time. She appears well-developed and  well-nourished.  Cardiovascular: Normal rate.   Pulmonary/Chest: Effort normal.  Musculoskeletal: Normal range of motion.  Neurological: She is oriented to person, place, and time.  Skin: Skin is warm and dry.  Psychiatric: She has a normal mood and affect. Her behavior is normal.  Vitals reviewed.   RECENT LABS AND TESTS: BMET    Component Value Date/Time   NA 141 11/29/2016 0945   K 4.2 11/29/2016 0945   CL 102 11/29/2016 0945   CO2 24 11/29/2016 0945   GLUCOSE 110 (H) 11/29/2016 0945   GLUCOSE 110 (H) 01/12/2016 0952   BUN 10 11/29/2016 0945   CREATININE 0.68 11/29/2016 0945   CALCIUM 8.5 (L) 11/29/2016 0945   GFRNONAA 105 11/29/2016 0945   GFRAA 121 11/29/2016 0945   Lab Results  Component Value Date   HGBA1C 5.6 11/29/2016   HGBA1C 5.8 12/24/2013   Lab Results  Component Value Date   INSULIN 15.9 11/29/2016   CBC    Component Value Date/Time   WBC 8.1 11/29/2016 0945   WBC 5.0 01/12/2016 0952   RBC 4.13 11/29/2016 0945   RBC 4.55 01/12/2016 0952   HGB 12.4 11/29/2016 0945   HCT 38.2 11/29/2016 0945   PLT 255.0 01/12/2016 0952   MCV 93 11/29/2016 0945   MCH 30.0 11/29/2016 0945   MCHC 32.5 11/29/2016 0945   MCHC 34.1 01/12/2016 0952   RDW 13.7 11/29/2016 0945   LYMPHSABS 1.1 11/29/2016 0945   MONOABS 0.4 01/12/2016 0952   EOSABS 0.1 11/29/2016 0945   BASOSABS 0.0 11/29/2016 0945   Iron/TIBC/Ferritin/ %Sat No results found for: IRON, TIBC, FERRITIN, IRONPCTSAT Lipid Panel     Component Value Date/Time   CHOL 156 11/29/2016 0945   TRIG 53 11/29/2016 0945   HDL 53 11/29/2016 0945   CHOLHDL 3 01/12/2016 0952   VLDL 14.6 01/12/2016 0952   LDLCALC 92 11/29/2016 0945   Hepatic Function Panel     Component Value Date/Time   PROT 6.3 11/29/2016 0945   ALBUMIN 4.3 11/29/2016 0945   AST 47 (H) 11/29/2016 0945   ALT 68 (H) 11/29/2016 0945   ALKPHOS 75 11/29/2016 0945   BILITOT 0.2 11/29/2016 0945   BILIDIR 0.0 11/20/2013 0921      Component Value  Date/Time   TSH 1.710 11/29/2016 0945   TSH 2.22 01/12/2016 0952   TSH 1.87 01/13/2015 1036    ASSESSMENT AND PLAN: Insulin resistance  Class 3 severe obesity with serious comorbidity and body mass index (BMI) of 40.0 to 44.9 in adult, unspecified obesity type ([place])  PLAN:  Insulin Resistance Kristi Ramos will continue to work on weight loss, exercise, and decreasing simple carbohydrates in her diet to help decrease the risk of diabetes. She was informed that eating too many simple carbohydrates or too many calories at one sitting increases the likelihood  of GI side effects. We will recheck labs in 2 months and Kristi Ramos agreed to follow up with [place] as directed to monitor her progress.  We spent > than 50% of the 15 minute visit on the counseling as documented in the note.  Obesity Kristi Ramos is currently in the action stage of change. As such, her goal is to continue with weight loss efforts She has agreed to follow the Category 2 plan  +100 calories Kristi Ramos has been instructed to work up to a goal of 150 minutes of combined cardio and strengthening exercise per week for weight loss and overall health benefits. We discussed the following Behavioral Modification Strategies today: celebration eating strategies, increasing lean protein intake, decreasing simple carbohydrates and ways to avoid boredom eating  Kristi Ramos has agreed to follow up with our clinic in 3 weeks. She was informed of the importance of frequent follow up visits to maximize her success with intensive lifestyle modifications for her multiple health conditions.  I, Doreene Nest, am acting as transcriptionist for Dennard Nip, MD  I have reviewed the above documentation for accuracy and completeness, and I agree with the above. -Dennard Nip, MD   OBESITY BEHAVIORAL INTERVENTION VISIT  Today's visit was # 3 out of 22.  Starting weight: 246 lbs Starting date: 11/29/16 Today's weight : 234 lbs  Today's date: 12/27/2016 Total lbs lost to date:  12 (Patients must lose 7 lbs in the first 6 months to continue with counseling)   ASK: We discussed the diagnosis of obesity with Kristi Ramos today and Kristi Ramos agreed to give [place] permission to discuss obesity behavioral modification therapy today.  ASSESS: Kristi Ramos has the diagnosis of obesity and her BMI today is 42.79 Kristi Ramos is in the action stage of change   ADVISE: Kristi Ramos was educated on the multiple health risks of obesity as well as the benefit of weight loss to improve her health. She was advised of the need for long term treatment and the importance of lifestyle modifications.  AGREE: Multiple dietary modification options and treatment options were discussed and  Kristi Ramos agreed to follow the Category 2 plan +100 calories We discussed the following Behavioral Modification Strategies today: celebration eating strategies, increasing lean protein intake, decreasing simple carbohydrates and ways to avoid boredom eating

## 2016-12-28 ENCOUNTER — Other Ambulatory Visit: Payer: Self-pay | Admitting: Family Medicine

## 2016-12-28 ENCOUNTER — Ambulatory Visit: Payer: Self-pay

## 2016-12-28 DIAGNOSIS — Z1231 Encounter for screening mammogram for malignant neoplasm of breast: Secondary | ICD-10-CM

## 2016-12-31 ENCOUNTER — Other Ambulatory Visit: Payer: Self-pay | Admitting: Family Medicine

## 2016-12-31 DIAGNOSIS — I10 Essential (primary) hypertension: Secondary | ICD-10-CM

## 2017-01-11 ENCOUNTER — Encounter: Payer: Self-pay | Admitting: Family Medicine

## 2017-01-11 ENCOUNTER — Ambulatory Visit (INDEPENDENT_AMBULATORY_CARE_PROVIDER_SITE_OTHER): Payer: 59 | Admitting: Family Medicine

## 2017-01-11 VITALS — BP 126/76 | HR 79 | Temp 99.3°F | Ht 62.0 in | Wt 236.1 lb

## 2017-01-11 DIAGNOSIS — Z0000 Encounter for general adult medical examination without abnormal findings: Secondary | ICD-10-CM

## 2017-01-11 DIAGNOSIS — Z23 Encounter for immunization: Secondary | ICD-10-CM | POA: Diagnosis not present

## 2017-01-11 NOTE — Patient Instructions (Signed)
Preventive Care 40-64 Years, Female Preventive care refers to lifestyle choices and visits with your health care provider that can promote health and wellness. What does preventive care include?  A yearly physical exam. This is also called an annual well check.  Dental exams once or twice a year.  Routine eye exams. Ask your health care provider how often you should have your eyes checked.  Personal lifestyle choices, including: ? Daily care of your teeth and gums. ? Regular physical activity. ? Eating a healthy diet. ? Avoiding tobacco and drug use. ? Limiting alcohol use. ? Practicing safe sex. ? Taking low-dose aspirin daily starting at age 58. ? Taking vitamin and mineral supplements as recommended by your health care provider. What happens during an annual well check? The services and screenings done by your health care provider during your annual well check will depend on your age, overall health, lifestyle risk factors, and family history of disease. Counseling Your health care provider may ask you questions about your:  Alcohol use.  Tobacco use.  Drug use.  Emotional well-being.  Home and relationship well-being.  Sexual activity.  Eating habits.  Work and work Statistician.  Method of birth control.  Menstrual cycle.  Pregnancy history.  Screening You may have the following tests or measurements:  Height, weight, and BMI.  Blood pressure.  Lipid and cholesterol levels. These may be checked every 5 years, or more frequently if you are over 81 years old.  Skin check.  Lung cancer screening. You may have this screening every year starting at age 78 if you have a 30-pack-year history of smoking and currently smoke or have quit within the past 15 years.  Fecal occult blood test (FOBT) of the stool. You may have this test every year starting at age 65.  Flexible sigmoidoscopy or colonoscopy. You may have a sigmoidoscopy every 5 years or a colonoscopy  every 10 years starting at age 30.  Hepatitis C blood test.  Hepatitis B blood test.  Sexually transmitted disease (STD) testing.  Diabetes screening. This is done by checking your blood sugar (glucose) after you have not eaten for a while (fasting). You may have this done every 1-3 years.  Mammogram. This may be done every 1-2 years. Talk to your health care provider about when you should start having regular mammograms. This may depend on whether you have a family history of breast cancer.  BRCA-related cancer screening. This may be done if you have a family history of breast, ovarian, tubal, or peritoneal cancers.  Pelvic exam and Pap test. This may be done every 3 years starting at age 80. Starting at age 36, this may be done every 5 years if you have a Pap test in combination with an HPV test.  Bone density scan. This is done to screen for osteoporosis. You may have this scan if you are at high risk for osteoporosis.  Discuss your test results, treatment options, and if necessary, the need for more tests with your health care provider. Vaccines Your health care provider may recommend certain vaccines, such as:  Influenza vaccine. This is recommended every year.  Tetanus, diphtheria, and acellular pertussis (Tdap, Td) vaccine. You may need a Td booster every 10 years.  Varicella vaccine. You may need this if you have not been vaccinated.  Zoster vaccine. You may need this after age 5.  Measles, mumps, and rubella (MMR) vaccine. You may need at least one dose of MMR if you were born in  1957 or later. You may also need a second dose.  Pneumococcal 13-valent conjugate (PCV13) vaccine. You may need this if you have certain conditions and were not previously vaccinated.  Pneumococcal polysaccharide (PPSV23) vaccine. You may need one or two doses if you smoke cigarettes or if you have certain conditions.  Meningococcal vaccine. You may need this if you have certain  conditions.  Hepatitis A vaccine. You may need this if you have certain conditions or if you travel or work in places where you may be exposed to hepatitis A.  Hepatitis B vaccine. You may need this if you have certain conditions or if you travel or work in places where you may be exposed to hepatitis B.  Haemophilus influenzae type b (Hib) vaccine. You may need this if you have certain conditions.  Talk to your health care provider about which screenings and vaccines you need and how often you need them. This information is not intended to replace advice given to you by your health care provider. Make sure you discuss any questions you have with your health care provider. Document Released: 03/11/2015 Document Revised: 11/02/2015 Document Reviewed: 12/14/2014 Elsevier Interactive Patient Education  2017 [employer].

## 2017-01-11 NOTE — Progress Notes (Signed)
Subjective:     Kristi Ramos is a 46 y.o. female and is here for a comprehensive physical exam. The patient reports no problems.  Pt is participating in the healthy weight and wellness.    Social History   Socioeconomic History  . Marital status: Married    Spouse name: Kristi Ramos   . Number of children: Not on file  . Years of education: Not on file  . Highest education level: Not on file  Social Needs  . Financial resource strain: Not on file  . Food insecurity - worry: Not on file  . Food insecurity - inability: Not on file  . Transportation needs - medical: Not on file  . Transportation needs - non-medical: Not on file  Occupational History  . Occupation: Art therapist    Employer: [employer]    Comment: dental hygiene  Tobacco Use  . Smoking status: Never Smoker  . Smokeless tobacco: Never Used  Substance and Sexual Activity  . Alcohol use: Yes    Comment: Occ  . Drug use: No  . Sexual activity: Yes    Partners: Male  Other Topics Concern  . Not on file  Social History Narrative   Exercise--- just started back   Health Maintenance  Topic Date Due  . PNEUMOCOCCAL POLYSACCHARIDE VACCINE (1) 05/10/1972  . FOOT EXAM  05/10/1980  . OPHTHALMOLOGY EXAM  05/10/1980  . HIV Screening  05/10/1985  . MAMMOGRAM  12/21/2016  . HEMOGLOBIN A1C  05/30/2017  . PAP SMEAR  01/06/2018  . TETANUS/TDAP  01/04/2026  . INFLUENZA VACCINE  Completed    The following portions of the patient's history were reviewed and updated as appropriate:  She  has a past medical history of ADD (attention deficit disorder), Anxiety, Back pain, Cervical cancer ([place]) (2010), Chicken pox, Depression (2009), Gallbladder problem, GERD (gastroesophageal reflux disease), HTN (hypertension), Seasonal allergies, and Uterine cancer ([place]) (2010). She does not have any pertinent problems on file. She  has a past surgical history that includes Cholecystectomy; Ankle surgery (Right); Leg Surgery; and  Abdominal hysterectomy (2010). Her family history includes Alcohol abuse in her maternal grandfather and paternal grandmother; Anxiety disorder in her mother; Arthritis in her father and mother; Depression in her son; Drug abuse in her son; Heart attack in her paternal grandfather; Heart attack (age of onset: 60) in her father; Heart disease in her father and paternal grandfather; Hyperlipidemia in her father; Hypertension in her brother and mother; Stroke in her mother; Thyroid disease in her mother. She  reports that  has never smoked. she has never used smokeless tobacco. She reports that she drinks alcohol. She reports that she does not use drugs. She has a current medication list which includes the following prescription(s): famotidine, ibuprofen, lisinopril-hydrochlorothiazide, lisinopril-hydrochlorothiazide, omeprazole, trazodone, and vitamin d (ergocalciferol). Current Outpatient Medications on File Prior to Visit  Medication Sig Dispense Refill  . famotidine (PEPCID) 20 MG tablet Take 20 mg by mouth 2 (two) times daily as needed for heartburn or indigestion.    Marland Kitchen ibuprofen (ADVIL,MOTRIN) 200 MG tablet Take 200 mg by mouth every 6 (six) hours as needed.    Marland Kitchen lisinopril-hydrochlorothiazide (PRINZIDE,ZESTORETIC) 10-12.5 MG tablet Take 1 tablet by mouth daily. 90 tablet 1  . lisinopril-hydrochlorothiazide (PRINZIDE,ZESTORETIC) 10-12.5 MG tablet TAKE 1 TABLET DAILY 90 tablet 1  . omeprazole (PRILOSEC) 20 MG capsule TAKE 1 CAPSULE DAILY 90 capsule 1  . traZODone (DESYREL) 50 MG tablet TAKE 1 TO 2 TABLETS BY MOUTH AT BEDTIME AS  NEEDED FOR SLEEP 60 tablet 3  . Vitamin D, Ergocalciferol, (DRISDOL) 50000 units CAPS capsule Take 1 capsule (50,000 Units total) by mouth every 7 (seven) days. 4 capsule 0   No current facility-administered medications on file prior to visit.    She is allergic to moxifloxacin and augmentin [amoxicillin-pot clavulanate]..  Review of Systems Review of Systems   Constitutional: Negative for activity change, appetite change and fatigue.  HENT: Negative for hearing loss, congestion, tinnitus and ear discharge.  dentist q29m Eyes: Negative for visual disturbance (see optho q1y -- vision corrected to 20/20 with glasses).  Respiratory: Negative for cough, chest tightness and shortness of breath.   Cardiovascular: Negative for chest pain, palpitations and leg swelling.  Gastrointestinal: Negative for abdominal pain, diarrhea, constipation and abdominal distention.  Genitourinary: Negative for urgency, frequency, decreased urine volume and difficulty urinating.  Musculoskeletal: Negative for back pain, arthralgias and gait problem.  Skin: Negative for color change, pallor and rash.  Neurological: Negative for dizziness, light-headedness, numbness and headaches.  Hematological: Negative for adenopathy. Does not bruise/bleed easily.  Psychiatric/Behavioral: Negative for suicidal ideas, confusion, sleep disturbance, self-injury, dysphoric mood, decreased concentration and agitation.       Objective:    BP 126/76 (BP Location: Right Arm, Patient Position: Sitting, Cuff Size: Large)   Pulse 79   Temp 99.3 F (37.4 C) (Oral)   Ht 5\' 2"  (1.575 m)   Wt 236 lb 2 oz (107.1 kg)   SpO2 95%   BMI 43.19 kg/m  General appearance: alert, cooperative, appears stated age and no distress Head: Normocephalic, without obvious abnormality, atraumatic Eyes: conjunctivae/corneas clear. PERRL, EOM's intact. Fundi benign. Ears: normal TM's and external ear canals both ears Nose: Nares normal. Septum midline. Mucosa normal. No drainage or sinus tenderness. Throat: lips, mucosa, and tongue normal; teeth and gums normal Neck: no adenopathy, no carotid bruit, no JVD, supple, symmetrical, trachea midline and thyroid not enlarged, symmetric, no tenderness/mass/nodules Back: symmetric, no curvature. ROM normal. No CVA tenderness. Lungs: clear to auscultation  bilaterally Breasts: normal appearance, no masses or tenderness Heart: regular rate and rhythm, S1, S2 normal, no murmur, click, rub or gallop Abdomen: soft, non-tender; bowel sounds normal; no masses,  no organomegaly Pelvic: deferred Extremities: extremities normal, atraumatic, no cyanosis or edema Pulses: 2+ and symmetric Skin: Skin color, texture, turgor normal. No rashes or lesions Lymph nodes: Cervical, supraclavicular, and axillary nodes normal. Neurologic: Alert and oriented X 3, normal strength and tone. Normal symmetric reflexes. Normal coordination and gait    Assessment:    Healthy female exam.     Plan:    ghm utd Labs reviewed from October  See After Visit Summary for Counseling Recommendations     Need for influenza vaccination   - Flu Vaccine QUAD 6+ mos PF IM (Fluarix Quad PF)

## 2017-01-16 ENCOUNTER — Ambulatory Visit (INDEPENDENT_AMBULATORY_CARE_PROVIDER_SITE_OTHER): Payer: 59 | Admitting: Physician Assistant

## 2017-01-16 VITALS — BP 123/79 | HR 74 | Temp 98.3°F | Ht 62.0 in | Wt 229.0 lb

## 2017-01-16 DIAGNOSIS — Z9189 Other specified personal risk factors, not elsewhere classified: Secondary | ICD-10-CM

## 2017-01-16 DIAGNOSIS — Z6841 Body Mass Index (BMI) 40.0 and over, adult: Secondary | ICD-10-CM | POA: Diagnosis not present

## 2017-01-16 DIAGNOSIS — E559 Vitamin D deficiency, unspecified: Secondary | ICD-10-CM | POA: Diagnosis not present

## 2017-01-16 DIAGNOSIS — I10 Essential (primary) hypertension: Secondary | ICD-10-CM | POA: Diagnosis not present

## 2017-01-16 NOTE — Progress Notes (Signed)
Office: [phone]  /  Fax: [phone]   HPI:   Chief Complaint: OBESITY Norabelle is here to discuss her progress with her obesity treatment plan. She is on the Category 2 plan + 100 calories and is following her eating plan approximately 60 % of the time. She states she is exercising 0 minutes 0 times per week. Susannah continues to do well with weight loss. She would like to incorporate variety into her meals. She would like holiday eating strategies.  Her weight is 229 lb (103.9 kg) today and has had a weight loss of 5 pounds over a period of 3 weeks since her last visit. She has lost 17 lbs since starting treatment with [place].  Vitamin D deficiency Latoiya has a diagnosis of vitamin D deficiency. She is currently taking prescription Vit D and denies nausea, vomiting or muscle weakness.  At risk for osteopenia and osteoporosis Winola is at higher risk of osteopenia and osteoporosis due to vitamin D deficiency.   Hypertension Isela Stantz is a 46 y.o. female with hypertension. Conley's blood pressure is stable and denies chest pain or shortness of breath. She is working weight loss to help control her blood pressure with the goal of decreasing her risk of heart attack and stroke. Cathaleen's blood pressure is not currently controlled.  ALLERGIES: Allergies  Allergen Reactions  . Moxifloxacin Palpitations and Rash  . Augmentin [Amoxicillin-Pot Clavulanate] Other (See Comments)    tachycardia    MEDICATIONS: Current Outpatient Medications on File Prior to Visit  Medication Sig Dispense Refill  . famotidine (PEPCID) 20 MG tablet Take 20 mg by mouth 2 (two) times daily as needed for heartburn or indigestion.    Marland Kitchen ibuprofen (ADVIL,MOTRIN) 200 MG tablet Take 200 mg by mouth every 6 (six) hours as needed.    Marland Kitchen lisinopril-hydrochlorothiazide (PRINZIDE,ZESTORETIC) 10-12.5 MG tablet Take 1 tablet by mouth daily. 90 tablet 1  . lisinopril-hydrochlorothiazide (PRINZIDE,ZESTORETIC) 10-12.5 MG tablet TAKE 1  TABLET DAILY 90 tablet 1  . omeprazole (PRILOSEC) 20 MG capsule TAKE 1 CAPSULE DAILY 90 capsule 1  . traZODone (DESYREL) 50 MG tablet TAKE 1 TO 2 TABLETS BY MOUTH AT BEDTIME AS NEEDED FOR SLEEP 60 tablet 3  . Vitamin D, Ergocalciferol, (DRISDOL) 50000 units CAPS capsule Take 1 capsule (50,000 Units total) by mouth every 7 (seven) days. 4 capsule 0   No current facility-administered medications on file prior to visit.     PAST MEDICAL HISTORY: Past Medical History:  Diagnosis Date  . ADD (attention deficit disorder)   . Anxiety   . Back pain   . Cervical cancer ([place]) 2010  . Chicken pox   . Depression 2009  . Gallbladder problem   . GERD (gastroesophageal reflux disease)   . HTN (hypertension)   . Seasonal allergies   . Uterine cancer ([place]) 2010    PAST SURGICAL HISTORY: Past Surgical History:  Procedure Laterality Date  . ABDOMINAL HYSTERECTOMY  2010   TAH-- cancer  . ANKLE SURGERY Right    Osteomyelitis  . CHOLECYSTECTOMY    . LEG SURGERY     Calf Muscle Surgery    SOCIAL HISTORY: Social History   Tobacco Use  . Smoking status: Never Smoker  . Smokeless tobacco: Never Used  Substance Use Topics  . Alcohol use: Yes    Comment: Occ  . Drug use: No    FAMILY HISTORY: Family History  Problem Relation Age of Onset  . Arthritis Mother   . Stroke Mother   .  Hypertension Mother   . Thyroid disease Mother   . Anxiety disorder Mother   . Arthritis Father   . Hyperlipidemia Father   . Heart disease Father   . Heart attack Father 61  . Hypertension Brother   . Drug abuse Son   . Alcohol abuse Paternal Grandmother   . Alcohol abuse Maternal Grandfather   . Heart disease Paternal Grandfather   . Heart attack Paternal Grandfather   . Depression Son     ROS: Review of Systems  Constitutional: Positive for weight loss.  Respiratory: Negative for shortness of breath.   Cardiovascular: Negative for chest pain.  Gastrointestinal: Negative for nausea and  vomiting.  Musculoskeletal:       Negative muscle weakness    PHYSICAL EXAM: Blood pressure 123/79, pulse 74, temperature 98.3 F (36.8 C), temperature source Oral, height 5\' 2"  (1.575 m), weight 229 lb (103.9 kg), SpO2 98 %. Body mass index is 41.88 kg/m. Physical Exam  Constitutional: She is oriented to person, place, and time. She appears well-developed and well-nourished.  Cardiovascular: Normal rate.  Pulmonary/Chest: Effort normal.  Musculoskeletal: Normal range of motion.  Neurological: She is oriented to person, place, and time.  Skin: Skin is warm and dry.  Psychiatric: She has a normal mood and affect. Her behavior is normal.  Vitals reviewed.   RECENT LABS AND TESTS: BMET    Component Value Date/Time   NA 141 11/29/2016 0945   K 4.2 11/29/2016 0945   CL 102 11/29/2016 0945   CO2 24 11/29/2016 0945   GLUCOSE 110 (H) 11/29/2016 0945   GLUCOSE 110 (H) 01/12/2016 0952   BUN 10 11/29/2016 0945   CREATININE 0.68 11/29/2016 0945   CALCIUM 8.5 (L) 11/29/2016 0945   GFRNONAA 105 11/29/2016 0945   GFRAA 121 11/29/2016 0945   Lab Results  Component Value Date   HGBA1C 5.6 11/29/2016   HGBA1C 5.8 12/24/2013   Lab Results  Component Value Date   INSULIN 15.9 11/29/2016   CBC    Component Value Date/Time   WBC 8.1 11/29/2016 0945   WBC 5.0 01/12/2016 0952   RBC 4.13 11/29/2016 0945   RBC 4.55 01/12/2016 0952   HGB 12.4 11/29/2016 0945   HCT 38.2 11/29/2016 0945   PLT 255.0 01/12/2016 0952   MCV 93 11/29/2016 0945   MCH 30.0 11/29/2016 0945   MCHC 32.5 11/29/2016 0945   MCHC 34.1 01/12/2016 0952   RDW 13.7 11/29/2016 0945   LYMPHSABS 1.1 11/29/2016 0945   MONOABS 0.4 01/12/2016 0952   EOSABS 0.1 11/29/2016 0945   BASOSABS 0.0 11/29/2016 0945   Iron/TIBC/Ferritin/ %Sat No results found for: IRON, TIBC, FERRITIN, IRONPCTSAT Lipid Panel     Component Value Date/Time   CHOL 156 11/29/2016 0945   TRIG 53 11/29/2016 0945   HDL 53 11/29/2016 0945    CHOLHDL 3 01/12/2016 0952   VLDL 14.6 01/12/2016 0952   LDLCALC 92 11/29/2016 0945   Hepatic Function Panel     Component Value Date/Time   PROT 6.3 11/29/2016 0945   ALBUMIN 4.3 11/29/2016 0945   AST 47 (H) 11/29/2016 0945   ALT 68 (H) 11/29/2016 0945   ALKPHOS 75 11/29/2016 0945   BILITOT 0.2 11/29/2016 0945   BILIDIR 0.0 11/20/2013 0921      Component Value Date/Time   TSH 1.710 11/29/2016 0945   TSH 2.22 01/12/2016 0952   TSH 1.87 01/13/2015 1036    ASSESSMENT AND PLAN: Vitamin D deficiency - Plan: Vitamin D, Ergocalciferol, (  DRISDOL) 50000 units CAPS capsule  Essential hypertension  At risk for osteoporosis  Class 3 severe obesity with serious comorbidity and body mass index (BMI) of 40.0 to 44.9 in adult, unspecified obesity type ([place])  PLAN:  Vitamin D Deficiency Doralene was informed that low vitamin D levels contributes to fatigue and are associated with obesity, breast, and colon cancer. Abriel agrees to continue taking prescription Vit D @50 ,000 IU every week #4 and we will refill for 1 month. She will follow up for routine testing of vitamin D, at least 2-3 times per year. She was informed of the risk of over-replacement of vitamin D and agrees to not increase her dose unless he discusses this with [place] first. Kimi agrees to follow up with our clinic in 2 to 3 weeks.  At risk for osteopenia and osteoporosis Raissa is at risk for osteopenia and osteoporsis due to her vitamin D deficiency. She was encouraged to take her vitamin D and follow her higher calcium diet and increase strengthening exercise to help strengthen her bones and decrease her risk of osteopenia and osteoporosis.  Hypertension We discussed sodium restriction, working on healthy weight loss, and a regular exercise program as the means to achieve improved blood pressure control. Dalonda agreed with this plan and agreed to follow up as directed. We will continue to monitor her blood pressure as well as her progress with  the above lifestyle modifications. She will continue her medications as prescribed and will watch for signs of hypotension as she continues her lifestyle modifications. Ashlinn agrees to follow up with our clinic in 2 to 3 weeks.  Obesity Makinna is currently in the action stage of change. As such, her goal is to continue with weight loss efforts She has agreed to change to keep a food journal with 500 calories and 40 grams of protein at supper daily and follow the Category 2 plan Kassidy has been instructed to work up to a goal of 150 minutes of combined cardio and strengthening exercise per week for weight loss and overall health benefits. We discussed the following Behavioral Modification Strategies today: increasing lean protein intake and holiday eating strategies    Keani has agreed to follow up with our clinic in 2 to 3 weeks. She was informed of the importance of frequent follow up visits to maximize her success with intensive lifestyle modifications for her multiple health conditions.  I, Trixie Dredge, am acting as transcriptionist for Lacy Duverney, PA-C  I have reviewed the above documentation for accuracy and completeness, and I agree with the above. -Lacy Duverney, PA-C  I have reviewed the above note and agree with the plan. -Dennard Nip, MD     Today's visit was # 4 out of 22.  Starting weight: 246 lbs Starting date: 11/29/16 Today's weight : 229 lbs  Today's date: 01/16/2017 Total lbs lost to date: 17 (Patients must lose 7 lbs in the first 6 months to continue with counseling)   ASK: We discussed the diagnosis of obesity with [name] today and Lorene agreed to give [place] permission to discuss obesity behavioral modification therapy today.  ASSESS: Adaijah has the diagnosis of obesity and her BMI today is 41.87 Mckinley is in the action stage of change   ADVISE: Dorcus was educated on the multiple health risks of obesity as well as the benefit of weight loss to improve her health.  She was advised of the need for long term treatment and the importance of lifestyle modifications.  AGREE: Multiple dietary modification options and treatment options were discussed and  Petrona agreed to keep a food journal with 500 calories and 40 grams of protein at supper daily and follow the Category 2 plan We discussed the following Behavioral Modification Strategies today: increasing lean protein intake and holiday eating strategies

## 2017-01-19 ENCOUNTER — Other Ambulatory Visit (INDEPENDENT_AMBULATORY_CARE_PROVIDER_SITE_OTHER): Payer: Self-pay | Admitting: Family Medicine

## 2017-01-19 DIAGNOSIS — E559 Vitamin D deficiency, unspecified: Secondary | ICD-10-CM

## 2017-01-21 MED ORDER — VITAMIN D (ERGOCALCIFEROL) 1.25 MG (50000 UNIT) PO CAPS
50000.0000 [IU] | ORAL_CAPSULE | ORAL | 0 refills | Status: DC
Start: 2017-01-21 — End: 2017-03-14

## 2017-01-25 ENCOUNTER — Ambulatory Visit
Admission: RE | Admit: 2017-01-25 | Discharge: 2017-01-25 | Disposition: A | Discharge status: Home / Self Care | Payer: 59 | Source: Ambulatory Visit | Attending: Family Medicine | Admitting: Family Medicine

## 2017-01-25 DIAGNOSIS — Z1231 Encounter for screening mammogram for malignant neoplasm of breast: Secondary | ICD-10-CM

## 2017-01-29 ENCOUNTER — Other Ambulatory Visit: Payer: Self-pay | Admitting: Family Medicine

## 2017-01-29 DIAGNOSIS — N6489 Other specified disorders of breast: Secondary | ICD-10-CM

## 2017-01-31 ENCOUNTER — Ambulatory Visit (INDEPENDENT_AMBULATORY_CARE_PROVIDER_SITE_OTHER): Payer: 59 | Admitting: Physician Assistant

## 2017-01-31 VITALS — BP 156/86 | HR 64 | Temp 98.0°F | Ht 62.0 in | Wt 231.0 lb

## 2017-01-31 DIAGNOSIS — I10 Essential (primary) hypertension: Secondary | ICD-10-CM

## 2017-01-31 DIAGNOSIS — Z6841 Body Mass Index (BMI) 40.0 and over, adult: Secondary | ICD-10-CM | POA: Diagnosis not present

## 2017-01-31 NOTE — Progress Notes (Signed)
Office: [phone]  /  Fax: [phone]   HPI:   Chief Complaint: OBESITY Vallorie is here to discuss her progress with her obesity treatment plan. She is on the keep a food journal with 500 calories and 40 grams of protein at supper daily and follow the Category 2 plan and is following her eating plan approximately 20 % of the time. She states she is exercising 0 minutes 0 times per week. Ylianna was sick with and upper respiratory infection and could not follow the plan. She is now better and is motivated to get back on track and continue with weight loss.  Her weight is 231 lb (104.8 kg) today and has gained 2 pounds since her last visit. She has lost 15 lbs since starting treatment with [place].  Hypertension Dejha King is a 46 y.o. female with hypertension. Jayelyn's blood pressure is elevated and she denies chest pain or shortness of breath. She is working weight loss to help control her blood pressure with the goal of decreasing her risk of heart attack and stroke. Shaylee's blood pressure is not currently controlled.  ALLERGIES: Allergies  Allergen Reactions  . Moxifloxacin Palpitations and Rash  . Augmentin [Amoxicillin-Pot Clavulanate] Other (See Comments)    tachycardia    MEDICATIONS: Current Outpatient Medications on File Prior to Visit  Medication Sig Dispense Refill  . famotidine (PEPCID) 20 MG tablet Take 20 mg by mouth 2 (two) times daily as needed for heartburn or indigestion.    Marland Kitchen ibuprofen (ADVIL,MOTRIN) 200 MG tablet Take 200 mg by mouth every 6 (six) hours as needed.    Marland Kitchen lisinopril-hydrochlorothiazide (PRINZIDE,ZESTORETIC) 10-12.5 MG tablet Take 1 tablet by mouth daily. 90 tablet 1  . lisinopril-hydrochlorothiazide (PRINZIDE,ZESTORETIC) 10-12.5 MG tablet TAKE 1 TABLET DAILY 90 tablet 1  . omeprazole (PRILOSEC) 20 MG capsule TAKE 1 CAPSULE DAILY 90 capsule 1  . traZODone (DESYREL) 50 MG tablet TAKE 1 TO 2 TABLETS BY MOUTH AT BEDTIME AS NEEDED FOR SLEEP 60 tablet 3  .  Vitamin D, Ergocalciferol, (DRISDOL) 50000 units CAPS capsule Take 1 capsule (50,000 Units total) by mouth every 7 (seven) days. 4 capsule 0   No current facility-administered medications on file prior to visit.     PAST MEDICAL HISTORY: Past Medical History:  Diagnosis Date  . ADD (attention deficit disorder)   . Anxiety   . Back pain   . Cervical cancer ([place]) 2010  . Chicken pox   . Depression 2009  . Gallbladder problem   . GERD (gastroesophageal reflux disease)   . HTN (hypertension)   . Seasonal allergies   . Uterine cancer ([place]) 2010    PAST SURGICAL HISTORY: Past Surgical History:  Procedure Laterality Date  . ABDOMINAL HYSTERECTOMY  2010   TAH-- cancer  . ANKLE SURGERY Right    Osteomyelitis  . CHOLECYSTECTOMY    . LEG SURGERY     Calf Muscle Surgery    SOCIAL HISTORY: Social History   Tobacco Use  . Smoking status: Never Smoker  . Smokeless tobacco: Never Used  Substance Use Topics  . Alcohol use: Yes    Comment: Occ  . Drug use: No    FAMILY HISTORY: Family History  Problem Relation Age of Onset  . Arthritis Mother   . Stroke Mother   . Hypertension Mother   . Thyroid disease Mother   . Anxiety disorder Mother   . Arthritis Father   . Hyperlipidemia Father   . Heart disease Father   .  Heart attack Father 68  . Hypertension Brother   . Drug abuse Son   . Alcohol abuse Paternal Grandmother   . Alcohol abuse Maternal Grandfather   . Heart disease Paternal Grandfather   . Heart attack Paternal Grandfather   . Depression Son     ROS: Review of Systems  Constitutional: Negative for weight loss.  Respiratory: Negative for shortness of breath.   Cardiovascular: Negative for chest pain.    PHYSICAL EXAM: Blood pressure (!) 156/86, pulse 64, temperature 98 F (36.7 C), temperature source Oral, height 5\' 2"  (1.575 m), weight 231 lb (104.8 kg), SpO2 100 %. Body mass index is 42.25 kg/m. Physical Exam  Constitutional: She is oriented to  person, place, and time. She appears well-developed and well-nourished.  Cardiovascular: Normal rate.  Pulmonary/Chest: Effort normal.  Musculoskeletal: Normal range of motion.  Neurological: She is oriented to person, place, and time.  Skin: Skin is warm and dry.  Psychiatric: She has a normal mood and affect. Her behavior is normal.  Vitals reviewed.   RECENT LABS AND TESTS: BMET    Component Value Date/Time   NA 141 11/29/2016 0945   K 4.2 11/29/2016 0945   CL 102 11/29/2016 0945   CO2 24 11/29/2016 0945   GLUCOSE 110 (H) 11/29/2016 0945   GLUCOSE 110 (H) 01/12/2016 0952   BUN 10 11/29/2016 0945   CREATININE 0.68 11/29/2016 0945   CALCIUM 8.5 (L) 11/29/2016 0945   GFRNONAA 105 11/29/2016 0945   GFRAA 121 11/29/2016 0945   Lab Results  Component Value Date   HGBA1C 5.6 11/29/2016   HGBA1C 5.8 12/24/2013   Lab Results  Component Value Date   INSULIN 15.9 11/29/2016   CBC    Component Value Date/Time   WBC 8.1 11/29/2016 0945   WBC 5.0 01/12/2016 0952   RBC 4.13 11/29/2016 0945   RBC 4.55 01/12/2016 0952   HGB 12.4 11/29/2016 0945   HCT 38.2 11/29/2016 0945   PLT 255.0 01/12/2016 0952   MCV 93 11/29/2016 0945   MCH 30.0 11/29/2016 0945   MCHC 32.5 11/29/2016 0945   MCHC 34.1 01/12/2016 0952   RDW 13.7 11/29/2016 0945   LYMPHSABS 1.1 11/29/2016 0945   MONOABS 0.4 01/12/2016 0952   EOSABS 0.1 11/29/2016 0945   BASOSABS 0.0 11/29/2016 0945   Iron/TIBC/Ferritin/ %Sat No results found for: IRON, TIBC, FERRITIN, IRONPCTSAT Lipid Panel     Component Value Date/Time   CHOL 156 11/29/2016 0945   TRIG 53 11/29/2016 0945   HDL 53 11/29/2016 0945   CHOLHDL 3 01/12/2016 0952   VLDL 14.6 01/12/2016 0952   LDLCALC 92 11/29/2016 0945   Hepatic Function Panel     Component Value Date/Time   PROT 6.3 11/29/2016 0945   ALBUMIN 4.3 11/29/2016 0945   AST 47 (H) 11/29/2016 0945   ALT 68 (H) 11/29/2016 0945   ALKPHOS 75 11/29/2016 0945   BILITOT 0.2 11/29/2016 0945    BILIDIR 0.0 11/20/2013 0921      Component Value Date/Time   TSH 1.710 11/29/2016 0945   TSH 2.22 01/12/2016 0952   TSH 1.87 01/13/2015 1036    ASSESSMENT AND PLAN: Essential hypertension  Class 3 severe obesity with serious comorbidity and body mass index (BMI) of 40.0 to 44.9 in adult, unspecified obesity type (HCC)  PLAN:  Hypertension We discussed sodium restriction, working on healthy weight loss, and a regular exercise program as the means to achieve improved blood pressure control. Shauntavia agreed with this plan and  agreed to follow up as directed. We will continue to monitor her blood pressure as well as her progress with the above lifestyle modifications. She will continue her medications as prescribed and will watch for signs of hypotension as she continues her lifestyle modifications. Jamileth agrees to follow up with our clinic in 3 weeks.  We spent > than 50% of the 15 minute visit on the counseling as documented in the note.  Obesity Johnni is currently in the action stage of change. As such, her goal is to continue with weight loss efforts She has agreed to keep a food journal with 500 calories and 40 grams of protein at supper daily and follow the Category 2 plan Stephanie has been instructed to work up to a goal of 150 minutes of combined cardio and strengthening exercise per week for weight loss and overall health benefits. We discussed the following Behavioral Modification Strategies today: increasing lean protein intake and work on meal planning and easy cooking plans   Japleen has agreed to follow up with our clinic in 3 weeks. She was informed of the importance of frequent follow up visits to maximize her success with intensive lifestyle modifications for her multiple health conditions.  I, Trixie Dredge, am acting as transcriptionist for Lacy Duverney, PA-C  I have reviewed the above documentation for accuracy and completeness, and I agree with the above. -Lacy Duverney, PA-C  I  have reviewed the above note and agree with the plan. -Dennard Nip, MD    Today's visit was # 5 out of 22.  Starting weight: 246 lbs Starting date: 11/29/16 Today's weight : 231 lbs  Today's date: 01/31/2017 Total lbs lost to date: 15 (Patients must lose 7 lbs in the first 6 months to continue with counseling)   ASK: We discussed the diagnosis of obesity with [name] today and Naysha agreed to give [place] permission to discuss obesity behavioral modification therapy today.  ASSESS: Milica has the diagnosis of obesity and her BMI today is 42.24 Arrin is in the action stage of change   ADVISE: Mirian was educated on the multiple health risks of obesity as well as the benefit of weight loss to improve her health. She was advised of the need for long term treatment and the importance of lifestyle modifications.  AGREE: Multiple dietary modification options and treatment options were discussed and  Genice agreed to keep a food journal with 500 calories and 40 grams of protein at supper daily and follow the Category 2 plan We discussed the following Behavioral Modification Strategies today: increasing lean protein intake and work on meal planning and easy cooking plans

## 2017-02-15 ENCOUNTER — Ambulatory Visit: Payer: Self-pay

## 2017-02-15 ENCOUNTER — Ambulatory Visit
Admission: RE | Admit: 2017-02-15 | Discharge: 2017-02-15 | Disposition: A | Discharge status: Home / Self Care | Payer: 59 | Source: Ambulatory Visit | Attending: Family Medicine | Admitting: Family Medicine

## 2017-02-15 DIAGNOSIS — N6489 Other specified disorders of breast: Secondary | ICD-10-CM

## 2017-02-15 DIAGNOSIS — R922 Inconclusive mammogram: Secondary | ICD-10-CM | POA: Diagnosis not present

## 2017-02-21 ENCOUNTER — Ambulatory Visit (INDEPENDENT_AMBULATORY_CARE_PROVIDER_SITE_OTHER): Payer: 59 | Admitting: Physician Assistant

## 2017-02-23 ENCOUNTER — Encounter: Payer: Self-pay | Admitting: Family Medicine

## 2017-02-23 DIAGNOSIS — G4700 Insomnia, unspecified: Secondary | ICD-10-CM

## 2017-02-24 ENCOUNTER — Other Ambulatory Visit: Payer: Self-pay | Admitting: Family Medicine

## 2017-02-24 DIAGNOSIS — G4700 Insomnia, unspecified: Secondary | ICD-10-CM

## 2017-02-25 ENCOUNTER — Telehealth: Payer: Self-pay | Admitting: Family Medicine

## 2017-02-25 ENCOUNTER — Other Ambulatory Visit: Payer: Self-pay | Admitting: Family Medicine

## 2017-02-25 DIAGNOSIS — G4700 Insomnia, unspecified: Secondary | ICD-10-CM

## 2017-02-25 MED ORDER — TRAZODONE HCL 50 MG PO TABS: 50.0000 mg | ORAL_TABLET | Freq: Every evening | ORAL | 3 refills | Status: DC | PRN

## 2017-02-25 NOTE — Telephone Encounter (Signed)
Copied from [place] [phone]. Topic: Quick Communication - See Telephone Encounter >> Feb 25, 2017  9:06 AM Cleaster Corin, NT wrote: CRM for notification. See Telephone encounter for:   02/25/17. Pt. Calling to get refill on med. Trazodone. Pt can be reached at [place], [place] - [place] [address] [place] [address] Phone: [phone] Fax: [phone]

## 2017-03-04 ENCOUNTER — Encounter: Payer: Self-pay | Admitting: Family

## 2017-03-04 ENCOUNTER — Ambulatory Visit: Payer: 59 | Admitting: Family

## 2017-03-04 VITALS — BP 129/76 | HR 74 | Temp 98.3°F | Resp 16 | Ht 62.0 in | Wt 235.0 lb

## 2017-03-04 DIAGNOSIS — J069 Acute upper respiratory infection, unspecified: Secondary | ICD-10-CM

## 2017-03-04 MED ORDER — FLUTICASONE PROPIONATE 50 MCG/ACT NA SUSP: 2.0000 | Freq: Every day | NASAL | 3 refills | Status: DC

## 2017-03-04 MED ORDER — CEFDINIR 300 MG PO CAPS: 300.0000 mg | ORAL_CAPSULE | Freq: Two times a day (BID) | ORAL | 0 refills | Status: DC

## 2017-03-04 NOTE — Patient Instructions (Addendum)
Symptoms are most consistent with a virus at this time.  You may continue neti pot and try adding flonase 2 sprays each nostril daily as well as claritin 10mg  once daily. If symptoms are not improved in 3 days you may begin cefdinir (antibiotic). Call if you are not improved in 1 week.

## 2017-03-04 NOTE — Progress Notes (Signed)
Subjective:    Patient ID: Kristi Ramos, female    DOB: 1970/05/05, 47 y.o.   MRN: [id number]  HPI  Patient is a 47 yr old female who presents today with chief complaint of sinus congestion/pressure.  Reports that symptoms began last Tuesday. Worsened on Saturday 03/02/17. She has been using ibuprofen due to the pressure in her had. Unsure if she has had a fever.  Denies ear pain.  Light bothers her eyes. Has not taken any decongestants.  Using neti pot with saline with brief improvement.    Review of Systems See HPI  Past Medical History:  Diagnosis Date  . ADD (attention deficit disorder)   . Anxiety   . Back pain   . Cervical cancer ([place]) 2010  . Chicken pox   . Depression 2009  . Gallbladder problem   . GERD (gastroesophageal reflux disease)   . HTN (hypertension)   . Seasonal allergies   . Uterine cancer ([place]) 2010     Social History   Socioeconomic History  . Marital status: Married    Spouse name: Elta Guadel[place]   . Number of children: Not on file  . Years of education: Not on file  . Highest education level: Not on file  Social Needs  . Financial resource strain: Not on file  . Food insecurity - worry: Not on file  . Food insecurity - inability: Not on file  . Transportation needs - medical: Not on file  . Transportation needs - non-medical: Not on file  Occupational History  . Occupation: Art therapist    Employer: [employer]    Comment: dental hygiene  Tobacco Use  . Smoking status: Never Smoker  . Smokeless tobacco: Never Used  Substance and Sexual Activity  . Alcohol use: Yes    Comment: Occ  . Drug use: No  . Sexual activity: Yes    Partners: Male  Other Topics Concern  . Not on file  Social History Narrative   Exercise--- just started back    Past Surgical History:  Procedure Laterality Date  . ABDOMINAL HYSTERECTOMY  2010   TAH-- cancer  . ANKLE SURGERY Right    Osteomyelitis  . CHOLECYSTECTOMY    . LEG SURGERY     Calf  Muscle Surgery    Family History  Problem Relation Age of Onset  . Arthritis Mother   . Stroke Mother   . Hypertension Mother   . Thyroid disease Mother   . Anxiety disorder Mother   . Arthritis Father   . Hyperlipidemia Father   . Heart disease Father   . Heart attack Father 100  . Hypertension Brother   . Drug abuse Son   . Alcohol abuse Paternal Grandmother   . Alcohol abuse Maternal Grandfather   . Heart disease Paternal Grandfather   . Heart attack Paternal Grandfather   . Depression Son     Allergies  Allergen Reactions  . Moxifloxacin Palpitations and Rash  . Augmentin [Amoxicillin-Pot Clavulanate] Other (See Comments)    tachycardia    Current Outpatient Medications on File Prior to Visit  Medication Sig Dispense Refill  . famotidine (PEPCID) 20 MG tablet Take 20 mg by mouth 2 (two) times daily as needed for heartburn or indigestion.    Marland Kitchen ibuprofen (ADVIL,MOTRIN) 200 MG tablet Take 200 mg by mouth every 6 (six) hours as needed.    Marland Kitchen lisinopril-hydrochlorothiazide (PRINZIDE,ZESTORETIC) 10-12.5 MG tablet Take 1 tablet by mouth daily. 90 tablet 1  . lisinopril-hydrochlorothiazide (  PRINZIDE,ZESTORETIC) 10-12.5 MG tablet TAKE 1 TABLET DAILY 90 tablet 1  . omeprazole (PRILOSEC) 20 MG capsule TAKE 1 CAPSULE DAILY 90 capsule 1  . traZODone (DESYREL) 50 MG tablet TAKE 1 TO 2 TABLETS BY MOUTH AT BEDTIME AS NEEDED FOR SLEEP 60 tablet 3  . traZODone (DESYREL) 50 MG tablet Take 1-2 tablets (50-100 mg total) by mouth at bedtime as needed. for sleep 60 tablet 3  . Vitamin D, Ergocalciferol, (DRISDOL) 50000 units CAPS capsule Take 1 capsule (50,000 Units total) by mouth every 7 (seven) days. 4 capsule 0   No current facility-administered medications on file prior to visit.     BP 129/76 (BP Location: Right Arm, Patient Position: Sitting, Cuff Size: Large)   Pulse 74   Temp 98.3 F (36.8 C) (Oral)   Resp 16   Ht 5\' 2"  (1.575 m)   Wt 235 lb (106.6 kg)   SpO2 100%   BMI 42.98  kg/m       Objective:   Physical Exam  Constitutional: She appears well-developed and well-nourished.  HENT:  Right Ear: Tympanic membrane and ear canal normal.  Left Ear: Tympanic membrane and ear canal normal.  Nose: Right sinus exhibits maxillary sinus tenderness and frontal sinus tenderness. Left sinus exhibits maxillary sinus tenderness and frontal sinus tenderness.  Mouth/Throat: No oropharyngeal exudate, posterior oropharyngeal edema or posterior oropharyngeal erythema.  Eyes: No scleral icterus.  Cardiovascular: Normal rate, regular rhythm and normal heart sounds.  No murmur heard. Pulmonary/Chest: Effort normal and breath sounds normal. No respiratory distress. She has no wheezes.  Lymphadenopathy:    She has no cervical adenopathy.  Psychiatric: She has a normal mood and affect. Her behavior is normal. Judgment and thought content normal.          Assessment & Plan:  Viral URI- advised pt as follows:  Symptoms are most consistent with a virus at this time.  You may continue neti pot and try adding flonase 2 sprays each nostril daily as well as claritin 10mg  once daily. If symptoms are not improved in 3 days you may begin cefdinir (antibiotic). Call if you are not improved in 1 week.

## 2017-03-07 ENCOUNTER — Ambulatory Visit (INDEPENDENT_AMBULATORY_CARE_PROVIDER_SITE_OTHER): Payer: 59 | Admitting: Physician Assistant

## 2017-03-14 ENCOUNTER — Ambulatory Visit (INDEPENDENT_AMBULATORY_CARE_PROVIDER_SITE_OTHER): Payer: 59 | Admitting: Physician Assistant

## 2017-03-14 VITALS — BP 139/83 | HR 81 | Temp 98.1°F | Ht 62.0 in | Wt 232.0 lb

## 2017-03-14 DIAGNOSIS — E559 Vitamin D deficiency, unspecified: Secondary | ICD-10-CM | POA: Diagnosis not present

## 2017-03-14 DIAGNOSIS — Z6841 Body Mass Index (BMI) 40.0 and over, adult: Secondary | ICD-10-CM

## 2017-03-14 DIAGNOSIS — I10 Essential (primary) hypertension: Secondary | ICD-10-CM

## 2017-03-14 DIAGNOSIS — Z9189 Other specified personal risk factors, not elsewhere classified: Secondary | ICD-10-CM | POA: Diagnosis not present

## 2017-03-14 MED ORDER — VITAMIN D (ERGOCALCIFEROL) 1.25 MG (50000 UNIT) PO CAPS: 50000.0000 [IU] | ORAL_CAPSULE | ORAL | 0 refills | Status: DC

## 2017-03-14 NOTE — Progress Notes (Addendum)
Office: [phone]  /  Fax: [phone]   HPI:   Chief Complaint: OBESITY Kristi Ramos is here to discuss her progress with her obesity treatment plan. She is on the keep a food journal with 500 calories and 40 grams of protein at supper daily and follow the Category 2 plan and is following her eating plan approximately 15 % of the time. She states she is exercising 0 minutes 0 times per week. Kristi Ramos had increase holiday eating but has managed to get back on track to continue her weight loss.  Her weight is 232 lb (105.2 kg) today and has gained 1 pound since her last visit. She has lost 14 lbs since starting treatment with [place].  Vitamin D deficiency Kristi Ramos has a diagnosis of vitamin D deficiency. She is currently taking prescription Vit D and denies nausea, vomiting or muscle weakness.  Hypertension Kristi Ramos is a 47 y.o. female with hypertension. Kristi Ramos's blood pressure is stable. She denies chest pain or shortness of breath. She is working weight loss to help control her blood pressure with the goal of decreasing her risk of heart attack and stroke. Kristi Ramos's blood pressure is currently controlled.  At risk for cardiovascular disease Kristi Ramos is at a higher than average risk for cardiovascular disease due to obesity and hypertension. She currently denies any chest pain.  ALLERGIES: Allergies  Allergen Reactions  . Moxifloxacin Palpitations and Rash  . Augmentin [Amoxicillin-Pot Clavulanate] Other (See Comments)    tachycardia    MEDICATIONS: Current Outpatient Medications on File Prior to Visit  Medication Sig Dispense Refill  . cefdinir (OMNICEF) 300 MG capsule Take 1 capsule (300 mg total) by mouth 2 (two) times daily. 20 capsule 0  . famotidine (PEPCID) 20 MG tablet Take 20 mg by mouth 2 (two) times daily as needed for heartburn or indigestion.    . fluticasone (FLONASE) 50 MCG/ACT nasal spray Place 2 sprays into both nostrils daily. 16 g 3  . ibuprofen (ADVIL,MOTRIN) 200 MG tablet Take  200 mg by mouth every 6 (six) hours as needed.    Marland Kitchen lisinopril-hydrochlorothiazide (PRINZIDE,ZESTORETIC) 10-12.5 MG tablet TAKE 1 TABLET DAILY 90 tablet 1  . omeprazole (PRILOSEC) 20 MG capsule TAKE 1 CAPSULE DAILY 90 capsule 1  . traZODone (DESYREL) 50 MG tablet Take 1-2 tablets (50-100 mg total) by mouth at bedtime as needed. for sleep 60 tablet 3   No current facility-administered medications on file prior to visit.     PAST MEDICAL HISTORY: Past Medical History:  Diagnosis Date  . ADD (attention deficit disorder)   . Anxiety   . Back pain   . Cervical cancer ([place]) 2010  . Chicken pox   . Depression 2009  . Gallbladder problem   . GERD (gastroesophageal reflux disease)   . HTN (hypertension)   . Seasonal allergies   . Uterine cancer ([place]) 2010    PAST SURGICAL HISTORY: Past Surgical History:  Procedure Laterality Date  . ABDOMINAL HYSTERECTOMY  2010   TAH-- cancer  . ANKLE SURGERY Right    Osteomyelitis  . CHOLECYSTECTOMY    . LEG SURGERY     Calf Muscle Surgery    SOCIAL HISTORY: Social History   Tobacco Use  . Smoking status: Never Smoker  . Smokeless tobacco: Never Used  Substance Use Topics  . Alcohol use: Yes    Comment: Occ  . Drug use: No    FAMILY HISTORY: Family History  Problem Relation Age of Onset  . Arthritis Mother   .  Stroke Mother   . Hypertension Mother   . Thyroid disease Mother   . Anxiety disorder Mother   . Arthritis Father   . Hyperlipidemia Father   . Heart disease Father   . Heart attack Father 32  . Hypertension Brother   . Drug abuse Son   . Alcohol abuse Paternal Grandmother   . Alcohol abuse Maternal Grandfather   . Heart disease Paternal Grandfather   . Heart attack Paternal Grandfather   . Depression Son     ROS: Review of Systems  Constitutional: Negative for weight loss.  Respiratory: Negative for shortness of breath.   Cardiovascular: Negative for chest pain.  Gastrointestinal: Negative for nausea and  vomiting.  Musculoskeletal:       Negative muscle weakness    PHYSICAL EXAM: Blood pressure 139/83, pulse 81, temperature 98.1 F (36.7 C), temperature source Oral, height 5\' 2"  (1.575 m), weight 232 lb (105.2 kg), SpO2 97 %. Body mass index is 42.43 kg/m. Physical Exam  Constitutional: She is oriented to person, place, and time. She appears well-developed and well-nourished.  Cardiovascular: Normal rate.  Pulmonary/Chest: Effort normal.  Musculoskeletal: Normal range of motion.  Neurological: She is oriented to person, place, and time.  Skin: Skin is warm and dry.  Psychiatric: She has a normal mood and affect. Her behavior is normal.  Vitals reviewed.   RECENT LABS AND TESTS: BMET    Component Value Date/Time   NA 141 11/29/2016 0945   K 4.2 11/29/2016 0945   CL 102 11/29/2016 0945   CO2 24 11/29/2016 0945   GLUCOSE 110 (H) 11/29/2016 0945   GLUCOSE 110 (H) 01/12/2016 0952   BUN 10 11/29/2016 0945   CREATININE 0.68 11/29/2016 0945   CALCIUM 8.5 (L) 11/29/2016 0945   GFRNONAA 105 11/29/2016 0945   GFRAA 121 11/29/2016 0945   Lab Results  Component Value Date   HGBA1C 5.6 11/29/2016   HGBA1C 5.8 12/24/2013   Lab Results  Component Value Date   INSULIN 15.9 11/29/2016   CBC    Component Value Date/Time   WBC 8.1 11/29/2016 0945   WBC 5.0 01/12/2016 0952   RBC 4.13 11/29/2016 0945   RBC 4.55 01/12/2016 0952   HGB 12.4 11/29/2016 0945   HCT 38.2 11/29/2016 0945   PLT 255.0 01/12/2016 0952   MCV 93 11/29/2016 0945   MCH 30.0 11/29/2016 0945   MCHC 32.5 11/29/2016 0945   MCHC 34.1 01/12/2016 0952   RDW 13.7 11/29/2016 0945   LYMPHSABS 1.1 11/29/2016 0945   MONOABS 0.4 01/12/2016 0952   EOSABS 0.1 11/29/2016 0945   BASOSABS 0.0 11/29/2016 0945   Iron/TIBC/Ferritin/ %Sat No results found for: IRON, TIBC, FERRITIN, IRONPCTSAT Lipid Panel     Component Value Date/Time   CHOL 156 11/29/2016 0945   TRIG 53 11/29/2016 0945   HDL 53 11/29/2016 0945    CHOLHDL 3 01/12/2016 0952   VLDL 14.6 01/12/2016 0952   LDLCALC 92 11/29/2016 0945   Hepatic Function Panel     Component Value Date/Time   PROT 6.3 11/29/2016 0945   ALBUMIN 4.3 11/29/2016 0945   AST 47 (H) 11/29/2016 0945   ALT 68 (H) 11/29/2016 0945   ALKPHOS 75 11/29/2016 0945   BILITOT 0.2 11/29/2016 0945   BILIDIR 0.0 11/20/2013 0921      Component Value Date/Time   TSH 1.710 11/29/2016 0945   TSH 2.22 01/12/2016 0952   TSH 1.87 01/13/2015 1036  Results for JARITA, RAVAL (MRN [id number]) as  of 03/14/2017 12:11  Ref. Range 11/29/2016 09:45  Vitamin D, 25-Hydroxy Latest Ref Range: 30.0 - 100.0 ng/mL 31.7    ASSESSMENT AND PLAN: Vitamin D deficiency - Plan: Vitamin D, Ergocalciferol, (DRISDOL) 50000 units CAPS capsule  Essential hypertension  At risk for heart disease  Class 3 severe obesity with serious comorbidity and body mass index (BMI) of 40.0 to 44.9 in adult, unspecified obesity type ([place])  PLAN:  Vitamin D Deficiency Kristi Ramos was informed that low vitamin D levels contributes to fatigue and are associated with obesity, breast, and colon cancer. Kristi Ramos agrees to continue taking prescription Vit D @50 ,000 IU every week #4 and we will refill for 1 month. She will follow up for routine testing of vitamin D, at least 2-3 times per year. She was informed of the risk of over-replacement of vitamin D and agrees to not increase her dose unless she discusses this with [place] first. Kristi Ramos agrees to follow up with our clinic in 2 weeks.  Hypertension We discussed sodium restriction, working on healthy weight loss, and a regular exercise program as the means to achieve improved blood pressure control. Kristi Ramos agreed with this plan and agreed to follow up as directed. We will continue to monitor her blood pressure as well as her progress with the above lifestyle modifications. She will continue her medications as prescribed and will watch for signs of hypotension as she continues her  lifestyle modifications. Kristi Ramos agrees to follow up with our clinic in 2 weeks.  Cardiovascular risk counselling Kristi Ramos was given extended (15 minutes) coronary artery disease prevention counseling today. She is 47 y.o. female and has risk factors for heart disease including obesity and hypertension. We discussed intensive lifestyle modifications today with an emphasis on specific weight loss instructions and strategies. Pt was also informed of the importance of increasing exercise and decreasing saturated fats to help prevent heart disease.  Obesity Kristi Ramos is currently in the action stage of change. As such, her goal is to continue with weight loss efforts She has agreed to keep a food journal with 500 calories and 40 grams of protein at supper daily and follow the Category 2 plan Kristi Ramos has been instructed to work up to a goal of 150 minutes of combined cardio and strengthening exercise per week for weight loss and overall health benefits. We discussed the following Behavioral Modification Strategies today: increasing lean protein intake and work on meal planning and easy cooking plans   Kristi Ramos has agreed to follow up with our clinic in 2 weeks. She was informed of the importance of frequent follow up visits to maximize her success with intensive lifestyle modifications for her multiple health conditions.    OBESITY BEHAVIORAL INTERVENTION VISIT  Today's visit was # 6 out of 22.  Starting weight: 246 lbs Starting date: 11/29/16 Today's weight : 232 lbs  Today's date: 03/14/2017 Total lbs lost to date: 14 (Patients must lose 7 lbs in the first 6 months to continue with counseling)   ASK: We discussed the diagnosis of obesity with Kristi Ramos today and Kristi Ramos agreed to give [place] permission to discuss obesity behavioral modification therapy today.  ASSESS: Kristi Ramos has the diagnosis of obesity and her BMI today is 42.42 Kristi Ramos is in the action stage of change   ADVISE: Kristi Ramos was educated on the  multiple health risks of obesity as well as the benefit of weight loss to improve her health. She was advised of the need for long term treatment and the importance of  lifestyle modifications.  AGREE: Multiple dietary modification options and treatment options were discussed and  Kristi Ramos agreed to the above obesity treatment plan.   Kristi Ramos, am acting as transcriptionist for Lacy Duverney, PA-C I, Lacy Duverney [facility], have reviewed this note and agree with its contents.  I have reviewed the above documentation for accuracy and completeness, and I agree with the above. -Dennard Nip, MD

## 2017-03-28 ENCOUNTER — Encounter (INDEPENDENT_AMBULATORY_CARE_PROVIDER_SITE_OTHER): Payer: Self-pay

## 2017-03-28 ENCOUNTER — Ambulatory Visit (INDEPENDENT_AMBULATORY_CARE_PROVIDER_SITE_OTHER): Payer: 59 | Admitting: Physician Assistant

## 2017-04-01 ENCOUNTER — Encounter: Payer: Self-pay | Admitting: Family

## 2017-04-01 ENCOUNTER — Encounter: Payer: Self-pay | Admitting: Family Medicine

## 2017-04-01 DIAGNOSIS — I10 Essential (primary) hypertension: Secondary | ICD-10-CM

## 2017-04-02 MED ORDER — LISINOPRIL-HYDROCHLOROTHIAZIDE 10-12.5 MG PO TABS: 1.0000 | ORAL_TABLET | Freq: Every day | ORAL | 0 refills | Status: DC

## 2017-04-02 MED ORDER — FLUCONAZOLE 150 MG PO TABS: ORAL_TABLET | ORAL | 0 refills | Status: DC

## 2017-04-04 ENCOUNTER — Encounter: Payer: Self-pay | Admitting: Family Medicine

## 2017-04-04 ENCOUNTER — Ambulatory Visit (INDEPENDENT_AMBULATORY_CARE_PROVIDER_SITE_OTHER): Payer: 59 | Admitting: Physician Assistant

## 2017-04-04 VITALS — BP 143/88 | HR 71 | Temp 97.8°F | Ht 62.0 in | Wt 232.0 lb

## 2017-04-04 DIAGNOSIS — E559 Vitamin D deficiency, unspecified: Secondary | ICD-10-CM

## 2017-04-04 DIAGNOSIS — I10 Essential (primary) hypertension: Secondary | ICD-10-CM | POA: Diagnosis not present

## 2017-04-04 DIAGNOSIS — Z6841 Body Mass Index (BMI) 40.0 and over, adult: Secondary | ICD-10-CM

## 2017-04-04 DIAGNOSIS — Z9189 Other specified personal risk factors, not elsewhere classified: Secondary | ICD-10-CM | POA: Diagnosis not present

## 2017-04-04 MED ORDER — VITAMIN D (ERGOCALCIFEROL) 1.25 MG (50000 UNIT) PO CAPS: 50000.0000 [IU] | ORAL_CAPSULE | ORAL | 0 refills | Status: DC

## 2017-04-04 NOTE — Progress Notes (Signed)
Office: [phone]  /  Fax: [phone]   HPI:   Chief Complaint: OBESITY Kristi Ramos is here to discuss her progress with her obesity treatment plan. She is on the Category 2 plan and is following her eating plan approximately 60 % of the time. She states she is exercising 0 minutes 0 times per week. Kristi Ramos maintained her weight. She states she is mindful of her eating and has gotten back to the category 2 meal plan over the past week. Her weight is 232 lb (105.2 kg) today and has maintained weight over a period of 3 weeks since her last visit. She has lost 14 lbs since starting treatment with [place].  Vitamin D deficiency Kristi Ramos has a diagnosis of vitamin D deficiency. She is currently taking vit D and denies nausea, vomiting or muscle weakness.   Ref. Range 11/29/2016 09:45  Vitamin D, 25-Hydroxy Latest Ref Range: 30.0 - 100.0 ng/mL 31.7   Hypertension Kristi Ramos is a 47 y.o. female with hypertension. Her blood pressure has been elevated on multiple visits in the office. She was encouraged to talk to her PCP about medication adjustment.  Kristi Ramos denies chest pain or shortness of breath on exertion. She is working weight loss to help control her blood pressure with the goal of decreasing her risk of heart attack and stroke. Kristi Ramos blood pressure is not currently controlled.  At risk for cardiovascular disease Kristi Ramos is at a higher than average risk for cardiovascular disease due to obesity and hypertension. She currently denies any chest pain.  ALLERGIES: Allergies  Allergen Reactions  . Moxifloxacin Palpitations and Rash  . Augmentin [Amoxicillin-Pot Clavulanate] Other (See Comments)    tachycardia    MEDICATIONS: Current Outpatient Medications on File Prior to Visit  Medication Sig Dispense Refill  . cefdinir (OMNICEF) 300 MG capsule Take 1 capsule (300 mg total) by mouth 2 (two) times daily. 20 capsule 0  . famotidine (PEPCID) 20 MG tablet Take 20 mg by mouth 2 (two)  times daily as needed for heartburn or indigestion.    . fluconazole (DIFLUCAN) 150 MG tablet 1 tablet by mouth today, may repeat in 3 days if symptoms are not improved. 2 tablet 0  . fluticasone (FLONASE) 50 MCG/ACT nasal spray Place 2 sprays into both nostrils daily. 16 g 3  . ibuprofen (ADVIL,MOTRIN) 200 MG tablet Take 200 mg by mouth every 6 (six) hours as needed.    Marland Kitchen lisinopril-hydrochlorothiazide (PRINZIDE,ZESTORETIC) 10-12.5 MG tablet Take 1 tablet by mouth daily. 7 tablet 0  . omeprazole (PRILOSEC) 20 MG capsule TAKE 1 CAPSULE DAILY 90 capsule 1  . traZODone (DESYREL) 50 MG tablet Take 1-2 tablets (50-100 mg total) by mouth at bedtime as needed. for sleep 60 tablet 3   No current facility-administered medications on file prior to visit.     PAST MEDICAL HISTORY: Past Medical History:  Diagnosis Date  . ADD (attention deficit disorder)   . Anxiety   . Back pain   . Cervical cancer ([place]) 2010  . Chicken pox   . Depression 2009  . Gallbladder problem   . GERD (gastroesophageal reflux disease)   . HTN (hypertension)   . Seasonal allergies   . Uterine cancer ([place]) 2010    PAST SURGICAL HISTORY: Past Surgical History:  Procedure Laterality Date  . ABDOMINAL HYSTERECTOMY  2010   TAH-- cancer  . ANKLE SURGERY Right    Osteomyelitis  . CHOLECYSTECTOMY    . LEG SURGERY     Calf  Muscle Surgery    SOCIAL HISTORY: Social History   Tobacco Use  . Smoking status: Never Smoker  . Smokeless tobacco: Never Used  Substance Use Topics  . Alcohol use: Yes    Comment: Occ  . Drug use: No    FAMILY HISTORY: Family History  Problem Relation Age of Onset  . Arthritis Mother   . Stroke Mother   . Hypertension Mother   . Thyroid disease Mother   . Anxiety disorder Mother   . Arthritis Father   . Hyperlipidemia Father   . Heart disease Father   . Heart attack Father 62  . Hypertension Brother   . Drug abuse Son   . Alcohol abuse Paternal Grandmother   . Alcohol abuse  Maternal Grandfather   . Heart disease Paternal Grandfather   . Heart attack Paternal Grandfather   . Depression Son     ROS: Review of Systems  Constitutional: Negative for weight loss.  Respiratory: Negative for shortness of breath (on exertion).   Cardiovascular: Negative for chest pain.  Gastrointestinal: Negative for nausea and vomiting.  Musculoskeletal:       Negative for muscle weakness    PHYSICAL EXAM: Blood pressure (!) 143/88, pulse 71, temperature 97.8 F (36.6 C), temperature source Oral, height 5\' 2"  (1.575 m), weight 232 lb (105.2 kg), SpO2 98 %. Body mass index is 42.43 kg/m. Physical Exam  Constitutional: She is oriented to person, place, and time. She appears well-developed and well-nourished.  Cardiovascular: Normal rate.  Pulmonary/Chest: Effort normal.  Musculoskeletal: Normal range of motion.  Neurological: She is oriented to person, place, and time.  Skin: Skin is warm and dry.  Psychiatric: She has a normal mood and affect. Her behavior is normal.  Vitals reviewed.   RECENT LABS AND TESTS: BMET    Component Value Date/Time   NA 141 11/29/2016 0945   K 4.2 11/29/2016 0945   CL 102 11/29/2016 0945   CO2 24 11/29/2016 0945   GLUCOSE 110 (H) 11/29/2016 0945   GLUCOSE 110 (H) 01/12/2016 0952   BUN 10 11/29/2016 0945   CREATININE 0.68 11/29/2016 0945   CALCIUM 8.5 (L) 11/29/2016 0945   GFRNONAA 105 11/29/2016 0945   GFRAA 121 11/29/2016 0945   Lab Results  Component Value Date   HGBA1C 5.6 11/29/2016   HGBA1C 5.8 12/24/2013   Lab Results  Component Value Date   INSULIN 15.9 11/29/2016   CBC    Component Value Date/Time   WBC 8.1 11/29/2016 0945   WBC 5.0 01/12/2016 0952   RBC 4.13 11/29/2016 0945   RBC 4.55 01/12/2016 0952   HGB 12.4 11/29/2016 0945   HCT 38.2 11/29/2016 0945   PLT 255.0 01/12/2016 0952   MCV 93 11/29/2016 0945   MCH 30.0 11/29/2016 0945   MCHC 32.5 11/29/2016 0945   MCHC 34.1 01/12/2016 0952   RDW 13.7  11/29/2016 0945   LYMPHSABS 1.1 11/29/2016 0945   MONOABS 0.4 01/12/2016 0952   EOSABS 0.1 11/29/2016 0945   BASOSABS 0.0 11/29/2016 0945   Iron/TIBC/Ferritin/ %Sat No results found for: IRON, TIBC, FERRITIN, IRONPCTSAT Lipid Panel     Component Value Date/Time   CHOL 156 11/29/2016 0945   TRIG 53 11/29/2016 0945   HDL 53 11/29/2016 0945   CHOLHDL 3 01/12/2016 0952   VLDL 14.6 01/12/2016 0952   LDLCALC 92 11/29/2016 0945   Hepatic Function Panel     Component Value Date/Time   PROT 6.3 11/29/2016 0945   ALBUMIN 4.3 11/29/2016  0945   AST 47 (H) 11/29/2016 0945   ALT 68 (H) 11/29/2016 0945   ALKPHOS 75 11/29/2016 0945   BILITOT 0.2 11/29/2016 0945   BILIDIR 0.0 11/20/2013 0921      Component Value Date/Time   TSH 1.710 11/29/2016 0945   TSH 2.22 01/12/2016 0952   TSH 1.87 01/13/2015 1036    ASSESSMENT AND PLAN: Essential hypertension  Vitamin D deficiency - Plan: Vitamin D, Ergocalciferol, (DRISDOL) 50000 units CAPS capsule  At risk for heart disease  Class 3 severe obesity with serious comorbidity and body mass index (BMI) of 40.0 to 44.9 in adult, unspecified obesity type ([facility])  PLAN:  Vitamin D Deficiency Garnetta was informed that low vitamin D levels contributes to fatigue and are associated with obesity, breast, and colon cancer. She agrees to continue to take prescription Vit D @50 ,000 IU every week #4 with no refills and will follow up for routine testing of vitamin D, at least 2-3 times per year. She was informed of the risk of over-replacement of vitamin D and agrees to not increase her dose unless she discusses this with [place] first. Kristi Ramos agrees to follow up with our clinic in 2 weeks.  Hypertension We discussed sodium restriction, working on healthy weight loss, and a regular exercise program as the means to achieve improved blood pressure control. Kristi Ramos agreed with this plan and agreed to follow up as directed. We will continue to monitor her blood pressure as well  as her progress with the above lifestyle modifications. She will continue her medications as prescribed and will watch for signs of hypotension as she continues her lifestyle modifications.  Cardiovascular risk counseling Kristi Ramos was given extended (15 minutes) coronary artery disease prevention counseling today. She is 47 y.o. female and has risk factors for heart disease including obesity and hypertension. We discussed intensive lifestyle modifications today with an emphasis on specific weight loss instructions and strategies. Pt was also informed of the importance of increasing exercise and decreasing saturated fats to help prevent heart disease.  Obesity  Kristi Ramos is currently in the action stage of change. As such, her goal is to continue with weight loss efforts She has agreed to keep a food journal with 500 calories and 40 grams of protein at supper daily and follow the Category 2 plan Kristi Ramos has been instructed to work up to a goal of 150 minutes of combined cardio and strengthening exercise per week for weight loss and overall health benefits. We discussed the following Behavioral Modification Strategies today: increasing lean protein intake and keep a strict food journal  Kristi Ramos has agreed to follow up with our clinic in 2 weeks. She was informed of the importance of frequent follow up visits to maximize her success with intensive lifestyle modifications for her multiple health conditions.    OBESITY BEHAVIORAL INTERVENTION VISIT  Today's visit was # 7 out of 22.  Starting weight: 246 lbs Starting date: 11/29/16 Today's weight : 232 lbs Today's date: 04/04/2017 Total lbs lost to date: 14 (Patients must lose 7 lbs in the first 6 months to continue with counseling)   ASK: We discussed the diagnosis of obesity with Kristi Ramos today and Kristi Ramos agreed to give [place] permission to discuss obesity behavioral modification therapy today.  ASSESS: Kristi Ramos has the diagnosis of obesity and her BMI today  is 42.42 Kristi Ramos is in the action stage of change   ADVISE: Kristi Ramos was educated on the multiple health risks of obesity as well as the benefit  of weight loss to improve her health. She was advised of the need for long term treatment and the importance of lifestyle modifications.  AGREE: Multiple dietary modification options and treatment options were discussed and  Kristi Ramos agreed to the above obesity treatment plan.   Corey Skains, am acting as transcriptionist for [employer], PA-C I, Lacy Duverney [facility], have reviewed this note and agree with its content

## 2017-04-05 MED ORDER — LISINOPRIL-HYDROCHLOROTHIAZIDE 20-25 MG PO TABS: 1.0000 | ORAL_TABLET | Freq: Every day | ORAL | 0 refills | Status: DC

## 2017-04-05 NOTE — Telephone Encounter (Signed)
Yes-- she should take 2 of her lisinopril 10/12.5-------and send in lisinopirl 20/25 #30  1 po qd to the pharmacy [facility] in 2-3 weeks for bp check   Sent in new Rx to pharm and asked them to deactivate previous dosage/sent message to patient with above that PCP stated/thx dmf

## 2017-04-05 NOTE — Telephone Encounter (Signed)
Yes-- she should take 2 of her lisinopril 10/12.5-------and send in lisinopirl 20/25 #30  1 po qd to the pharmacy [facility] in 2-3 weeks for bp check

## 2017-04-09 DIAGNOSIS — H1089 Other conjunctivitis: Secondary | ICD-10-CM | POA: Diagnosis not present

## 2017-04-18 ENCOUNTER — Encounter: Payer: Self-pay | Admitting: Family Medicine

## 2017-04-18 ENCOUNTER — Ambulatory Visit: Payer: 59 | Admitting: Family Medicine

## 2017-04-18 ENCOUNTER — Ambulatory Visit (INDEPENDENT_AMBULATORY_CARE_PROVIDER_SITE_OTHER): Payer: 59 | Admitting: Physician Assistant

## 2017-04-18 VITALS — BP 130/78 | HR 81 | Temp 98.0°F | Resp 16 | Ht 62.0 in | Wt 235.0 lb

## 2017-04-18 VITALS — BP 127/83 | HR 78 | Temp 98.1°F | Ht 62.0 in | Wt 230.0 lb

## 2017-04-18 DIAGNOSIS — E559 Vitamin D deficiency, unspecified: Secondary | ICD-10-CM | POA: Diagnosis not present

## 2017-04-18 DIAGNOSIS — I10 Essential (primary) hypertension: Secondary | ICD-10-CM

## 2017-04-18 DIAGNOSIS — N958 Other specified menopausal and perimenopausal disorders: Secondary | ICD-10-CM | POA: Diagnosis not present

## 2017-04-18 DIAGNOSIS — Z9189 Other specified personal risk factors, not elsewhere classified: Secondary | ICD-10-CM | POA: Diagnosis not present

## 2017-04-18 DIAGNOSIS — Z6841 Body Mass Index (BMI) 40.0 and over, adult: Secondary | ICD-10-CM

## 2017-04-18 LAB — BASIC METABOLIC PANEL
BUN: 14 mg/dL (ref 6–23)
CALCIUM: 9.2 mg/dL (ref 8.4–10.5)
CO2: 28 mEq/L (ref 19–32)
CREATININE: 0.71 mg/dL (ref 0.40–1.20)
Chloride: 99 mEq/L (ref 96–112)
GFR: 93.81 mL/min (ref >60.00)
Glucose, Bld: 149 mg/dL — ABNORMAL HIGH (ref 70–99)
Potassium: 3.5 mEq/L (ref 3.5–5.1)
Sodium: 136 mEq/L (ref 135–145)

## 2017-04-18 LAB — LUTEINIZING HORMONE: LH: 5.15 m[IU]/mL

## 2017-04-18 MED ORDER — LISINOPRIL-HYDROCHLOROTHIAZIDE 20-25 MG PO TABS: 1.0000 | ORAL_TABLET | Freq: Every day | ORAL | 1 refills | Status: DC

## 2017-04-18 MED ORDER — VITAMIN D (ERGOCALCIFEROL) 1.25 MG (50000 UNIT) PO CAPS: 50000.0000 [IU] | ORAL_CAPSULE | ORAL | 0 refills | Status: DC

## 2017-04-18 MED ORDER — FLUOXETINE HCL 20 MG PO CAPS: 20.0000 mg | ORAL_CAPSULE | Freq: Every day | ORAL | 3 refills | Status: DC

## 2017-04-18 NOTE — Addendum Note (Signed)
Addended by: Roma Schanz R on: 04/18/2017 10:21 AM   Modules accepted: Orders

## 2017-04-18 NOTE — Assessment & Plan Note (Signed)
Start prozac  rto 1 month

## 2017-04-18 NOTE — Progress Notes (Signed)
Patient ID: Kristi Ramos, female    DOB: [birth year]  Age: 47 y.o. MRN: [id number]    Subjective:  Subjective  HPI Kristi Ramos presents for bp check -- she is feeling.  Review of Systems  Constitutional: Negative for activity change, appetite change, chills, fatigue, fever and unexpected weight change.  Respiratory: Negative for cough and shortness of breath.   Cardiovascular: Negative for chest pain and palpitations.  Gastrointestinal: Negative for abdominal distention and abdominal pain.  Genitourinary: Negative for difficulty urinating, dyspareunia, dysuria, flank pain, frequency, genital sores, hematuria, menstrual problem, pelvic pain, urgency, vaginal discharge and vaginal pain.  Musculoskeletal: Negative for back pain.  Psychiatric/Behavioral: Positive for dysphoric mood. Negative for behavioral problems and sleep disturbance. The patient is not nervous/anxious.     History Past Medical History:  Diagnosis Date  . ADD (attention deficit disorder)   . Anxiety   . Back pain   . Cervical cancer ([place]) 2010  . Chicken pox   . Depression 2009  . Gallbladder problem   . GERD (gastroesophageal reflux disease)   . HTN (hypertension)   . Seasonal allergies   . Uterine cancer ([place]) 2010    She has a past surgical history that includes Cholecystectomy; Ankle surgery (Right); Leg Surgery; and Abdominal hysterectomy (2010).   Her family history includes Alcohol abuse in her maternal grandfather and paternal grandmother; Anxiety disorder in her mother; Arthritis in her father and mother; Depression in her son; Drug abuse in her son; Heart attack in her paternal grandfather; Heart attack (age of onset: 49) in her father; Heart disease in her father and paternal grandfather; Hyperlipidemia in her father; Hypertension in her brother and mother; Stroke in her mother; Thyroid disease in her mother.She reports that  has never smoked. she has never used smokeless tobacco. She  reports that she drinks alcohol. She reports that she does not use drugs.  Current Outpatient Medications on File Prior to Visit  Medication Sig Dispense Refill  . famotidine (PEPCID) 20 MG tablet Take 20 mg by mouth 2 (two) times daily as needed for heartburn or indigestion.    . fluticasone (FLONASE) 50 MCG/ACT nasal spray Place 2 sprays into both nostrils daily. 16 g 3  . ibuprofen (ADVIL,MOTRIN) 200 MG tablet Take 200 mg by mouth every 6 (six) hours as needed.    Marland Kitchen lisinopril-hydrochlorothiazide (PRINZIDE,ZESTORETIC) 20-25 MG tablet Take 1 tablet by mouth daily. 90 tablet 0  . omeprazole (PRILOSEC) 20 MG capsule TAKE 1 CAPSULE DAILY 90 capsule 1  . traZODone (DESYREL) 50 MG tablet Take 1-2 tablets (50-100 mg total) by mouth at bedtime as needed. for sleep 60 tablet 3  . Vitamin D, Ergocalciferol, (DRISDOL) 50000 units CAPS capsule Take 1 capsule (50,000 Units total) by mouth every 7 (seven) days. 4 capsule 0  . ZYLET 0.5-0.3 % SUSP Place 1 drop into both eyes 3 (three) times daily.     No current facility-administered medications on file prior to visit.      Objective:  Objective  Physical Exam  Constitutional: She is oriented to person, place, and time. She appears well-developed and well-nourished.  HENT:  Head: Normocephalic and atraumatic.  Eyes: Conjunctivae and EOM are normal.  Neck: Normal range of motion. Neck supple. No JVD present. Carotid bruit is not present. No thyromegaly present.  Cardiovascular: Normal rate, regular rhythm and normal heart sounds.  No murmur heard. Pulmonary/Chest: Effort normal and breath sounds normal. No respiratory distress. She has no wheezes. She has no rales.  She exhibits no tenderness.  Musculoskeletal: She exhibits no edema.  Neurological: She is alert and oriented to person, place, and time.  Psychiatric: Her speech is normal and behavior is normal. Judgment and thought content normal. Cognition and memory are normal. She exhibits a  depressed mood.  Nursing note and vitals reviewed.  BP 130/78 (BP Location: Right Arm, Cuff Size: Large)   Pulse 81   Temp 98 F (36.7 C) (Oral)   Resp 16   Ht 5\' 2"  (1.575 m)   Wt 235 lb (106.6 kg)   SpO2 98%   BMI 42.98 kg/m  Wt Readings from Last 3 Encounters:  04/18/17 235 lb (106.6 kg)  04/04/17 232 lb (105.2 kg)  03/14/17 232 lb (105.2 kg)     Lab Results  Component Value Date   WBC 8.1 11/29/2016   HGB 12.4 11/29/2016   HCT 38.2 11/29/2016   PLT 255.0 01/12/2016   GLUCOSE 110 (H) 11/29/2016   CHOL 156 11/29/2016   TRIG 53 11/29/2016   HDL 53 11/29/2016   LDLCALC 92 11/29/2016   ALT 68 (H) 11/29/2016   AST 47 (H) 11/29/2016   NA 141 11/29/2016   K 4.2 11/29/2016   CL 102 11/29/2016   CREATININE 0.68 11/29/2016   BUN 10 11/29/2016   CO2 24 11/29/2016   TSH 1.710 11/29/2016   HGBA1C 5.6 11/29/2016    Mm Diag Breast Tomo Bilateral  Result Date: 02/15/2017 CLINICAL DATA:  47 year old female presenting for follow-up of a probably benign asymmetry in the left breast. EXAM: 2D DIGITAL DIAGNOSTIC BILATERAL MAMMOGRAM WITH CAD AND ADJUNCT TOMO COMPARISON:  Previous exam(s). ACR Breast Density Category c: The breast tissue is heterogeneously dense, which may obscure small masses. FINDINGS: The asymmetry of concern in the lower-inner quadrant of the left breast corresponds with a tortuous blood vessel. There are no suspicious masses or areas of distortion in this region. No suspicious calcifications, masses or areas of distortion are seen in the bilateral breasts. Mammographic images were processed with CAD. IMPRESSION: 1. The asymmetry in the lower-inner left breast corresponds with a tortuous blood vessel. 2.  No mammographic evidence of malignancy in the bilateral breasts. RECOMMENDATION: Screening mammogram in one year.(Code:[id number]-B-01Y) I have discussed the findings and recommendations with the patient. Results were also provided in writing at the conclusion of the visit. If  applicable, a reminder letter will be sent to the patient regarding the next appointment. BI-RADS CATEGORY  1: Negative. Electronically Signed   By: Ammie Ferrier M.D.   On: 02/15/2017 08:22     Assessment & Plan:  Plan  I have discontinued Lashawndra B. Mogensen's cefdinir and fluconazole. I am also having her start on FLUoxetine. Additionally, I am having her maintain her famotidine, ibuprofen, omeprazole, traZODone, fluticasone, Vitamin D (Ergocalciferol), lisinopril-hydrochlorothiazide, and ZYLET.  Meds ordered this encounter  Medications  . FLUoxetine (PROZAC) 20 MG capsule    Sig: Take 1 capsule (20 mg total) by mouth daily.    Dispense:  30 capsule    Refill:  3    Problem List Items Addressed This Visit      Unprioritized   HTN (hypertension)    Well controlled, no changes to meds. Encouraged heart healthy diet such as the DASH diet and exercise as tolerated.       Other specified menopausal and perimenopausal disorders - Primary    Start prozac  rto 1 month      Relevant Medications   FLUoxetine (PROZAC) 20 MG capsule  Follow-up: Return in about 4 weeks (around 05/16/2017), or if symptoms worsen or fail to improve, for recheck .  Ann Held, DO

## 2017-04-18 NOTE — Assessment & Plan Note (Signed)
Well controlled, no changes to meds. Encouraged heart healthy diet such as the DASH diet and exercise as tolerated.  °

## 2017-04-18 NOTE — Patient Instructions (Signed)
Perimenopause Perimenopause is the time when your body begins to move into the menopause (no menstrual period for 12 straight months). It is a natural process. Perimenopause can begin 2-8 years before the menopause and usually lasts for 1 year after the menopause. During this time, your ovaries may or may not produce an egg. The ovaries vary in their production of estrogen and progesterone hormones each month. This can cause irregular menstrual periods, difficulty getting pregnant, vaginal bleeding between periods, and uncomfortable symptoms. What are the causes?  Irregular production of the ovarian hormones, estrogen and progesterone, and not ovulating every month. Other causes include:  Tumor of the pituitary gland in the brain.  Medical disease that affects the ovaries.  Radiation treatment.  Chemotherapy.  Unknown causes.  Heavy smoking and excessive alcohol intake can bring on perimenopause sooner. What are the signs or symptoms?  Hot flashes.  Night sweats.  Irregular menstrual periods.  Decreased sex drive.  Vaginal dryness.  Headaches.  Mood swings.  Depression.  Memory problems.  Irritability.  Tiredness.  Weight gain.  Trouble getting pregnant.  The beginning of losing bone cells (osteoporosis).  The beginning of hardening of the arteries (atherosclerosis). How is this diagnosed? Your health care provider will make a diagnosis by analyzing your age, menstrual history, and symptoms. He or she will do a physical exam and note any changes in your body, especially your female organs. Female hormone tests may or may not be helpful depending on the amount of female hormones you produce and when you produce them. However, other hormone tests may be helpful to rule out other problems. How is this treated? In some cases, no treatment is needed. The decision on whether treatment is necessary during the perimenopause should be made by you and your health care  provider based on how the symptoms are affecting you and your lifestyle. Various treatments are available, such as:  Treating individual symptoms with a specific medicine for that symptom.  Herbal medicines that can help specific symptoms.  Counseling.  Group therapy. Follow these instructions at home:  Keep track of your menstrual periods (when they occur, how heavy they are, how long between periods, and how long they last) as well as your symptoms and when they started.  Only take over-the-counter or prescription medicines as directed by your health care provider.  Sleep and rest.  Exercise.  Eat a diet that contains calcium (good for your bones) and soy (acts like the estrogen hormone).  Do not smoke.  Avoid alcoholic beverages.  Take vitamin supplements as recommended by your health care provider. Taking vitamin E may help in certain cases.  Take calcium and vitamin D supplements to help prevent bone loss.  Group therapy is sometimes helpful.  Acupuncture may help in some cases. Contact a health care provider if:  You have questions about any symptoms you are having.  You need a referral to a specialist (gynecologist, psychiatrist, or psychologist). Get help right away if:  You have vaginal bleeding.  Your period lasts longer than 8 days.  Your periods are recurring sooner than 21 days.  You have bleeding after intercourse.  You have severe depression.  You have pain when you urinate.  You have severe headaches.  You have vision problems. This information is not intended to replace advice given to you by your health care provider. Make sure you discuss any questions you have with your health care provider. Document Released: 03/22/2004 Document Revised: 07/21/2015 Document Reviewed: 09/11/2012 Elsevier Interactive   Patient Education  2017 Elsevier Inc.  

## 2017-04-18 NOTE — Addendum Note (Signed)
Addended by: Caffie Pinto on: 04/18/2017 10:24 AM   Modules accepted: Orders

## 2017-04-18 NOTE — Progress Notes (Signed)
Office: [phone]  /  Fax: [phone]   HPI:   Chief Complaint: OBESITY Kristi Ramos is here to discuss her progress with her obesity treatment plan. She is on the  keep a food journal with 500 calories and 40 grams of protein at supper daily and follow the Category 2 plan and is following her eating plan approximately 60 % of the time. She states she is exercising 0 minutes 0 times per week. Kristi Ramos continues to do well with weight loss. She has been mindful of her protein and states her hunger is well controlled. Her weight is 230 lb (104.3 kg) today and has had a weight loss of 2 pounds over a period of 2 weeks since her last visit. She has lost 16 lbs since starting treatment with [place].  Vitamin D deficiency Kristi Ramos has a diagnosis of vitamin D deficiency. She is currently taking vit D and denies nausea, vomiting or muscle weakness.   Ref. Range 11/29/2016 09:45  Vitamin D, 25-Hydroxy Latest Ref Range: 30.0 - 100.0 ng/mL 31.7   Hypertension Kristi Ramos is a 47 y.o. female with hypertension. She had previously elevated blood pressure, and her PCP has added HCTZ to her Lisinopril. Kristi Ramos denies chest pain or shortness of breath on exertion. She is working weight loss to help control her blood pressure with the goal of decreasing her risk of heart attack and stroke. Kristi Ramos blood pressure is currently stable.  At risk for cardiovascular disease Kristi Ramos is at a higher than average risk for cardiovascular disease due to obesity and hypertension. She currently denies any chest pain.  ALLERGIES: Allergies  Allergen Reactions  . Moxifloxacin Palpitations and Rash  . Augmentin [Amoxicillin-Pot Clavulanate] Other (See Comments)    tachycardia    MEDICATIONS: Current Outpatient Medications on File Prior to Visit  Medication Sig Dispense Refill  . famotidine (PEPCID) 20 MG tablet Take 20 mg by mouth 2 (two) times daily as needed for heartburn or indigestion.    . fluticasone  (FLONASE) 50 MCG/ACT nasal spray Place 2 sprays into both nostrils daily. 16 g 3  . ibuprofen (ADVIL,MOTRIN) 200 MG tablet Take 200 mg by mouth every 6 (six) hours as needed.    Marland Kitchen omeprazole (PRILOSEC) 20 MG capsule TAKE 1 CAPSULE DAILY 90 capsule 1  . traZODone (DESYREL) 50 MG tablet Take 1-2 tablets (50-100 mg total) by mouth at bedtime as needed. for sleep 60 tablet 3   No current facility-administered medications on file prior to visit.     PAST MEDICAL HISTORY: Past Medical History:  Diagnosis Date  . ADD (attention deficit disorder)   . Anxiety   . Back pain   . Cervical cancer ([place]) 2010  . Chicken pox   . Depression 2009  . Gallbladder problem   . GERD (gastroesophageal reflux disease)   . HTN (hypertension)   . Seasonal allergies   . Uterine cancer ([place]) 2010    PAST SURGICAL HISTORY: Past Surgical History:  Procedure Laterality Date  . ABDOMINAL HYSTERECTOMY  2010   TAH-- cancer  . ANKLE SURGERY Right    Osteomyelitis  . CHOLECYSTECTOMY    . LEG SURGERY     Calf Muscle Surgery    SOCIAL HISTORY: Social History   Tobacco Use  . Smoking status: Never Smoker  . Smokeless tobacco: Never Used  Substance Use Topics  . Alcohol use: Yes    Comment: Occ  . Drug use: No    FAMILY HISTORY: Family History  Problem  Relation Age of Onset  . Arthritis Mother   . Stroke Mother   . Hypertension Mother   . Thyroid disease Mother   . Anxiety disorder Mother   . Arthritis Father   . Hyperlipidemia Father   . Heart disease Father   . Heart attack Father 73  . Hypertension Brother   . Drug abuse Son   . Alcohol abuse Paternal Grandmother   . Alcohol abuse Maternal Grandfather   . Heart disease Paternal Grandfather   . Heart attack Paternal Grandfather   . Depression Son     ROS: Review of Systems  Constitutional: Positive for weight loss.  Respiratory: Negative for shortness of breath (on exertion).   Cardiovascular: Negative for chest pain.    Gastrointestinal: Negative for nausea and vomiting.  Musculoskeletal:       Negative for muscle weakness    PHYSICAL EXAM: Blood pressure 127/83, pulse 78, temperature 98.1 F (36.7 C), temperature source Oral, height 5\' 2"  (1.575 m), weight 230 lb (104.3 kg), SpO2 99 %. Body mass index is 42.07 kg/m. Physical Exam  Constitutional: She is oriented to person, place, and time. She appears well-developed and well-nourished.  Cardiovascular: Normal rate.  Pulmonary/Chest: Effort normal.  Musculoskeletal: Normal range of motion.  Neurological: She is oriented to person, place, and time.  Skin: Skin is warm and dry.  Psychiatric: She has a normal mood and affect. Her behavior is normal.  Vitals reviewed.   RECENT LABS AND TESTS: BMET    Component Value Date/Time   NA 136 04/18/2017 1021   NA 141 11/29/2016 0945   K 3.5 04/18/2017 1021   CL 99 04/18/2017 1021   CO2 28 04/18/2017 1021   GLUCOSE 149 (H) 04/18/2017 1021   BUN 14 04/18/2017 1021   BUN 10 11/29/2016 0945   CREATININE 0.71 04/18/2017 1021   CALCIUM 9.2 04/18/2017 1021   GFRNONAA 105 11/29/2016 0945   GFRAA 121 11/29/2016 0945   Lab Results  Component Value Date   HGBA1C 5.6 11/29/2016   HGBA1C 5.8 12/24/2013   Lab Results  Component Value Date   INSULIN 15.9 11/29/2016   CBC    Component Value Date/Time   WBC 8.1 11/29/2016 0945   WBC 5.0 01/12/2016 0952   RBC 4.13 11/29/2016 0945   RBC 4.55 01/12/2016 0952   HGB 12.4 11/29/2016 0945   HCT 38.2 11/29/2016 0945   PLT 255.0 01/12/2016 0952   MCV 93 11/29/2016 0945   MCH 30.0 11/29/2016 0945   MCHC 32.5 11/29/2016 0945   MCHC 34.1 01/12/2016 0952   RDW 13.7 11/29/2016 0945   LYMPHSABS 1.1 11/29/2016 0945   MONOABS 0.4 01/12/2016 0952   EOSABS 0.1 11/29/2016 0945   BASOSABS 0.0 11/29/2016 0945   Iron/TIBC/Ferritin/ %Sat No results found for: IRON, TIBC, FERRITIN, IRONPCTSAT Lipid Panel     Component Value Date/Time   CHOL 156 11/29/2016 0945    TRIG 53 11/29/2016 0945   HDL 53 11/29/2016 0945   CHOLHDL 3 01/12/2016 0952   VLDL 14.6 01/12/2016 0952   LDLCALC 92 11/29/2016 0945   Hepatic Function Panel     Component Value Date/Time   PROT 6.3 11/29/2016 0945   ALBUMIN 4.3 11/29/2016 0945   AST 47 (H) 11/29/2016 0945   ALT 68 (H) 11/29/2016 0945   ALKPHOS 75 11/29/2016 0945   BILITOT 0.2 11/29/2016 0945   BILIDIR 0.0 11/20/2013 0921      Component Value Date/Time   TSH 1.710 11/29/2016 0945  TSH 2.22 01/12/2016 0952   TSH 1.87 01/13/2015 1036     Ref. Range 11/29/2016 09:45  Vitamin D, 25-Hydroxy Latest Ref Range: 30.0 - 100.0 ng/mL 31.7   ASSESSMENT AND PLAN: Essential hypertension  Vitamin D deficiency - Plan: Vitamin D, Ergocalciferol, (DRISDOL) 50000 units CAPS capsule  At risk for heart disease  Class 3 severe obesity with serious comorbidity and body mass index (BMI) of 40.0 to 44.9 in adult, unspecified obesity type ([place])  PLAN:  Vitamin D Deficiency Kristi Ramos was informed that low vitamin D levels contributes to fatigue and are associated with obesity, breast, and colon cancer. She agrees to continue to take prescription Vit D @50 ,000 IU every week #4 with no refills and will follow up for routine testing of vitamin D, at least 2-3 times per year. She was informed of the risk of over-replacement of vitamin D and agrees to not increase her dose unless she discusses this with [place] first. Kristi Ramos agrees to follow up with our clinic in 2 to 3 weeks.  Hypertension We discussed sodium restriction, working on healthy weight loss, and a regular exercise program as the means to achieve improved blood pressure control. Kristi Ramos agreed with this plan and agreed to follow up as directed. We will continue to monitor her blood pressure as well as her progress with the above lifestyle modifications. She will continue her medications as prescribed and will watch for signs of hypotension as she continues her lifestyle  modifications.  Cardiovascular risk counseling Kristi Ramos was given extended (15 minutes) coronary artery disease prevention counseling today. She is 48 y.o. female and has risk factors for heart disease including obesity and hypertension. We discussed intensive lifestyle modifications today with an emphasis on specific weight loss instructions and strategies. Pt was also informed of the importance of increasing exercise and decreasing saturated fats to help prevent heart disease.  Obesity Kristi Ramos is currently in the action stage of change. As such, her goal is to continue with weight loss efforts She has agreed to keep a food journal with 500 calories and 40 grams of protein at supper daily and follow the Category 2 plan Kristi Ramos has been instructed to work up to a goal of 150 minutes of combined cardio and strengthening exercise per week for weight loss and overall health benefits. We discussed the following Behavioral Modification Strategies today: increasing lean protein intake and work on meal planning and easy cooking plans  Kristi Ramos has agreed to follow up with our clinic in 2 to 3 weeks. She was informed of the importance of frequent follow up visits to maximize her success with intensive lifestyle modifications for her multiple health conditions.   OBESITY BEHAVIORAL INTERVENTION VISIT  Today's visit was # 8 out of 22.  Starting weight: 246 lbs Starting date: 11/29/16 Today's weight :  230 lbs Today's date: 04/18/2017 Total lbs lost to date: 16 (Patients must lose 7 lbs in the first 6 months to continue with counseling)   ASK: We discussed the diagnosis of obesity with Kristi Ramos today and Kristi Ramos agreed to give [place] permission to discuss obesity behavioral modification therapy today.  ASSESS: Kristi Ramos has the diagnosis of obesity and her BMI today is 42.06 Kristi Ramos is in the action stage of change   ADVISE: Kristi Ramos was educated on the multiple health risks of obesity as well as the benefit of weight  loss to improve her health. She was advised of the need for long term treatment and the importance of lifestyle modifications.  AGREE: Multiple dietary modification options and treatment options were discussed and  Kristi Ramos agreed to the above obesity treatment plan.   Corey Skains, am acting as transcriptionist for [employer], PA-C I, Lacy Duverney [facility], have reviewed this note and agree with its content.

## 2017-04-19 ENCOUNTER — Other Ambulatory Visit: Payer: Self-pay | Admitting: Family Medicine

## 2017-04-19 DIAGNOSIS — R739 Hyperglycemia, unspecified: Secondary | ICD-10-CM

## 2017-04-19 LAB — ESTRADIOL: Estradiol: 39 pg/mL

## 2017-04-19 NOTE — Telephone Encounter (Signed)
Fine with me

## 2017-04-25 ENCOUNTER — Other Ambulatory Visit (INDEPENDENT_AMBULATORY_CARE_PROVIDER_SITE_OTHER): Payer: 59

## 2017-04-25 ENCOUNTER — Encounter: Payer: Self-pay | Admitting: Family Medicine

## 2017-04-25 DIAGNOSIS — R739 Hyperglycemia, unspecified: Secondary | ICD-10-CM

## 2017-04-25 LAB — HEMOGLOBIN A1C: HEMOGLOBIN A1C: 6.1 % (ref 4.6–6.5)

## 2017-05-02 ENCOUNTER — Encounter: Payer: Self-pay | Admitting: Family Medicine

## 2017-05-02 ENCOUNTER — Ambulatory Visit (INDEPENDENT_AMBULATORY_CARE_PROVIDER_SITE_OTHER): Payer: 59 | Admitting: Physician Assistant

## 2017-05-02 VITALS — BP 138/83 | HR 85 | Temp 98.2°F | Ht 62.0 in | Wt 230.0 lb

## 2017-05-02 DIAGNOSIS — R7303 Prediabetes: Secondary | ICD-10-CM | POA: Diagnosis not present

## 2017-05-02 DIAGNOSIS — R739 Hyperglycemia, unspecified: Secondary | ICD-10-CM | POA: Diagnosis not present

## 2017-05-02 DIAGNOSIS — Z9189 Other specified personal risk factors, not elsewhere classified: Secondary | ICD-10-CM

## 2017-05-02 DIAGNOSIS — Z6841 Body Mass Index (BMI) 40.0 and over, adult: Secondary | ICD-10-CM

## 2017-05-02 DIAGNOSIS — I10 Essential (primary) hypertension: Secondary | ICD-10-CM | POA: Diagnosis not present

## 2017-05-02 MED ORDER — METFORMIN HCL 500 MG PO TABS
500.0000 mg | ORAL_TABLET | Freq: Every day | ORAL | 0 refills | Status: DC
Start: 2017-05-02 — End: 2017-05-23

## 2017-05-02 NOTE — Progress Notes (Signed)
Office: [phone]  /  Fax: [phone]   HPI:   Chief Complaint: OBESITY Kristi Ramos is here to discuss her progress with her obesity treatment plan. She is on the keep a food journal with 500 calories and 40 grams of protein at supper daily and follow the Category 2 plan and is following her eating plan approximately 70 % of the time. She states she is exercising 0 minutes 0 times per week. Kristi Ramos maintained her weight. She has been incorporating protein shakes which help control her hunger. However, she skips her dinner meal on some occasions.  Her weight is 230 lb (104.3 kg) today and has not lost weight since her last visit. She has lost 16 lbs since starting treatment with [place].  Pre-Diabetes Kristi Ramos has a diagnosis of pre-diabetes based on her elevated Hgb A1c and was informed this puts her at greater risk of developing diabetes. Latest Hgb A1c at 6.1, increased from 5.6. She is not taking metformin currently and continues to work on diet and exercise to decrease risk of diabetes. She denies polyphagia, nausea, or hypoglycemia.  At risk for diabetes Kristi Ramos is at higher than average risk for developing diabetes due to her obesity and pre-diabetes. She currently denies polyuria or polydipsia.  Hypertension Kristi Ramos is a 47 y.o. female with hypertension. Teneka's blood pressure is stable, her blood pressure medications has been adjusted and she is now on lisinopril-hydrochlorothiazide, tolerating it well. She denies chest pain or shortness of breath. She is working weight loss to help control her blood pressure with the goal of decreasing her risk of heart attack and stroke. Kristi Ramos's blood pressure is currently controlled.  ALLERGIES: Allergies  Allergen Reactions  . Moxifloxacin Palpitations and Rash  . Augmentin [Amoxicillin-Pot Clavulanate] Other (See Comments)    tachycardia    MEDICATIONS: Current Outpatient Medications on File Prior to Visit  Medication Sig Dispense Refill  .  famotidine (PEPCID) 20 MG tablet Take 20 mg by mouth 2 (two) times daily as needed for heartburn or indigestion.    Marland Kitchen FLUoxetine (PROZAC) 20 MG capsule Take 1 capsule (20 mg total) by mouth daily. 30 capsule 3  . fluticasone (FLONASE) 50 MCG/ACT nasal spray Place 2 sprays into both nostrils daily. 16 g 3  . ibuprofen (ADVIL,MOTRIN) 200 MG tablet Take 200 mg by mouth every 6 (six) hours as needed.    Marland Kitchen lisinopril-hydrochlorothiazide (PRINZIDE,ZESTORETIC) 20-25 MG tablet Take 1 tablet by mouth daily. 90 tablet 1  . omeprazole (PRILOSEC) 20 MG capsule TAKE 1 CAPSULE DAILY 90 capsule 1  . traZODone (DESYREL) 50 MG tablet Take 1-2 tablets (50-100 mg total) by mouth at bedtime as needed. for sleep 60 tablet 3  . Vitamin D, Ergocalciferol, (DRISDOL) 50000 units CAPS capsule Take 1 capsule (50,000 Units total) by mouth every 7 (seven) days. 4 capsule 0  . ZYLET 0.5-0.3 % SUSP Place 1 drop into both eyes 3 (three) times daily.     No current facility-administered medications on file prior to visit.     PAST MEDICAL HISTORY: Past Medical History:  Diagnosis Date  . ADD (attention deficit disorder)   . Anxiety   . Back pain   . Cervical cancer ([place]) 2010  . Chicken pox   . Depression 2009  . Gallbladder problem   . GERD (gastroesophageal reflux disease)   . HTN (hypertension)   . Seasonal allergies   . Uterine cancer ([place]) 2010    PAST SURGICAL HISTORY: Past Surgical History:  Procedure Laterality Date  .  ABDOMINAL HYSTERECTOMY  2010   TAH-- cancer  . ANKLE SURGERY Right    Osteomyelitis  . CHOLECYSTECTOMY    . LEG SURGERY     Calf Muscle Surgery    SOCIAL HISTORY: Social History   Tobacco Use  . Smoking status: Never Smoker  . Smokeless tobacco: Never Used  Substance Use Topics  . Alcohol use: Yes    Comment: Occ  . Drug use: No    FAMILY HISTORY: Family History  Problem Relation Age of Onset  . Arthritis Mother   . Stroke Mother   . Hypertension Mother   . Thyroid  disease Mother   . Anxiety disorder Mother   . Arthritis Father   . Hyperlipidemia Father   . Heart disease Father   . Heart attack Father 45  . Hypertension Brother   . Drug abuse Son   . Alcohol abuse Paternal Grandmother   . Alcohol abuse Maternal Grandfather   . Heart disease Paternal Grandfather   . Heart attack Paternal Grandfather   . Depression Son     ROS: Review of Systems  Constitutional: Negative for weight loss.  Respiratory: Negative for shortness of breath.   Cardiovascular: Negative for chest pain.  Gastrointestinal: Negative for nausea.  Endo/Heme/Allergies:       Negative polyphagia Negative hypoglycemia    PHYSICAL EXAM: Blood pressure 138/83, pulse 85, temperature 98.2 F (36.8 C), temperature source Oral, height 5\' 2"  (1.575 m), weight 230 lb (104.3 kg), SpO2 99 %. Body mass index is 42.07 kg/m. Physical Exam  Constitutional: She is oriented to person, place, and time. She appears well-developed and well-nourished.  Cardiovascular: Normal rate.  Pulmonary/Chest: Effort normal.  Musculoskeletal: Normal range of motion.  Neurological: She is oriented to person, place, and time.  Skin: Skin is warm and dry.  Psychiatric: She has a normal mood and affect. Her behavior is normal.  Vitals reviewed.   RECENT LABS AND TESTS: BMET    Component Value Date/Time   NA 136 04/18/2017 1021   NA 141 11/29/2016 0945   K 3.5 04/18/2017 1021   CL 99 04/18/2017 1021   CO2 28 04/18/2017 1021   GLUCOSE 149 (H) 04/18/2017 1021   BUN 14 04/18/2017 1021   BUN 10 11/29/2016 0945   CREATININE 0.71 04/18/2017 1021   CALCIUM 9.2 04/18/2017 1021   GFRNONAA 105 11/29/2016 0945   GFRAA 121 11/29/2016 0945   Lab Results  Component Value Date   HGBA1C 6.1 04/25/2017   HGBA1C 5.6 11/29/2016   HGBA1C 5.8 12/24/2013   Lab Results  Component Value Date   INSULIN 15.9 11/29/2016   CBC    Component Value Date/Time   WBC 8.1 11/29/2016 0945   WBC 5.0 01/12/2016  0952   RBC 4.13 11/29/2016 0945   RBC 4.55 01/12/2016 0952   HGB 12.4 11/29/2016 0945   HCT 38.2 11/29/2016 0945   PLT 255.0 01/12/2016 0952   MCV 93 11/29/2016 0945   MCH 30.0 11/29/2016 0945   MCHC 32.5 11/29/2016 0945   MCHC 34.1 01/12/2016 0952   RDW 13.7 11/29/2016 0945   LYMPHSABS 1.1 11/29/2016 0945   MONOABS 0.4 01/12/2016 0952   EOSABS 0.1 11/29/2016 0945   BASOSABS 0.0 11/29/2016 0945   Iron/TIBC/Ferritin/ %Sat No results found for: IRON, TIBC, FERRITIN, IRONPCTSAT Lipid Panel     Component Value Date/Time   CHOL 156 11/29/2016 0945   TRIG 53 11/29/2016 0945   HDL 53 11/29/2016 0945   CHOLHDL 3 01/12/2016 5852  VLDL 14.6 01/12/2016 0952   LDLCALC 92 11/29/2016 0945   Hepatic Function Panel     Component Value Date/Time   PROT 6.3 11/29/2016 0945   ALBUMIN 4.3 11/29/2016 0945   AST 47 (H) 11/29/2016 0945   ALT 68 (H) 11/29/2016 0945   ALKPHOS 75 11/29/2016 0945   BILITOT 0.2 11/29/2016 0945   BILIDIR 0.0 11/20/2013 0921      Component Value Date/Time   TSH 1.710 11/29/2016 0945   TSH 2.22 01/12/2016 0952   TSH 1.87 01/13/2015 1036    ASSESSMENT AND PLAN: Prediabetes - Plan: metFORMIN (GLUCOPHAGE) 500 MG tablet  Essential hypertension  At risk for diabetes mellitus  Class 3 severe obesity with serious comorbidity and body mass index (BMI) of 40.0 to 44.9 in adult, unspecified obesity type ([place])  PLAN:  Pre-Diabetes Kristi Ramos will continue to work on weight loss, exercise, and decreasing simple carbohydrates in her diet to help decrease the risk of diabetes. We dicussed metformin including benefits and risks. She was informed that eating too many simple carbohydrates or too many calories at one sitting increases the likelihood of GI side effects. Albina agrees to start metformin 500 mg q AM #30 with no refills. Kristi Ramos agrees to follow up with our clinic in 2 weeks as directed to monitor her progress.  Diabetes risk counselling Kristi Ramos was given extended (15  minutes) diabetes prevention counseling today. She is 47 y.o. female and has risk factors for diabetes including obesity and pre-diabetes. We discussed intensive lifestyle modifications today with an emphasis on weight loss as well as increasing exercise and decreasing simple carbohydrates in her diet.  Hypertension We discussed sodium restriction, working on healthy weight loss, and a regular exercise program as the means to achieve improved blood pressure control. Kristi Ramos agreed with this plan and agreed to follow up as directed. We will continue to monitor her blood pressure as well as her progress with the above lifestyle modifications. She will continue her medications as prescribed and will watch for signs of hypotension as she continues her lifestyle modifications. Kristi Ramos agrees to follow up with our clinic in 2 weeks.  Obesity Kristi Ramos is currently in the action stage of change. As such, her goal is to continue with weight loss efforts She has agreed to keep a food journal with 500 calories and 40 grams of protein at supper daily and follow the Category 2 plan Kristi Ramos has been instructed to work up to a goal of 150 minutes of combined cardio and strengthening exercise per week for weight loss and overall health benefits. We discussed the following Behavioral Modification Strategies today: increasing lean protein intake and no skipping meals   Kristi Ramos has agreed to follow up with our clinic in 2 weeks. She was informed of the importance of frequent follow up visits to maximize her success with intensive lifestyle modifications for her multiple health conditions.   OBESITY BEHAVIORAL INTERVENTION VISIT  Today's visit was # 9 out of 22.  Starting weight: 246 lbs Starting date: 11/29/16 Today's weight : 230 lbs Today's date: 05/02/2017 Total lbs lost to date: 16 (Patients must lose 7 lbs in the first 6 months to continue with counseling)   ASK: We discussed the diagnosis of obesity with Kristi Ramos today and Kristi Ramos agreed to give [place] permission to discuss obesity behavioral modification therapy today.  ASSESS: Ayvah has the diagnosis of obesity and her BMI today is 42.06 Kristi Ramos is in the action stage of change   ADVISE: Kristi Ramos  was educated on the multiple health risks of obesity as well as the benefit of weight loss to improve her health. She was advised of the need for long term treatment and the importance of lifestyle modifications.  AGREE: Multiple dietary modification options and treatment options were discussed and  Kristi Ramos agreed to the above obesity treatment plan.   Kristi Ramos, am acting as transcriptionist for Lacy Duverney, PA-C I, Lacy Duverney [facility], have reviewed this note and agree with its content.

## 2017-05-16 ENCOUNTER — Ambulatory Visit: Payer: 59 | Admitting: Family Medicine

## 2017-05-16 ENCOUNTER — Encounter: Payer: Self-pay | Admitting: Family Medicine

## 2017-05-16 DIAGNOSIS — N958 Other specified menopausal and perimenopausal disorders: Secondary | ICD-10-CM

## 2017-05-16 DIAGNOSIS — I10 Essential (primary) hypertension: Secondary | ICD-10-CM | POA: Diagnosis not present

## 2017-05-16 MED ORDER — LISINOPRIL-HYDROCHLOROTHIAZIDE 20-25 MG PO TABS: 1.0000 | ORAL_TABLET | Freq: Every day | ORAL | 1 refills | Status: DC

## 2017-05-16 MED ORDER — FLUOXETINE HCL 20 MG PO CAPS: 20.0000 mg | ORAL_CAPSULE | Freq: Every day | ORAL | 3 refills | Status: DC

## 2017-05-16 NOTE — Assessment & Plan Note (Signed)
con't prozac  rto 3 month

## 2017-05-16 NOTE — Assessment & Plan Note (Signed)
Well controlled, no changes to meds. Encouraged heart healthy diet such as the DASH diet and exercise as tolerated.  °

## 2017-05-16 NOTE — Patient Instructions (Signed)
Perimenopause Perimenopause is the time when your body begins to move into the menopause (no menstrual period for 12 straight months). It is a natural process. Perimenopause can begin 2-8 years before the menopause and usually lasts for 1 year after the menopause. During this time, your ovaries may or may not produce an egg. The ovaries vary in their production of estrogen and progesterone hormones each month. This can cause irregular menstrual periods, difficulty getting pregnant, vaginal bleeding between periods, and uncomfortable symptoms. What are the causes?  Irregular production of the ovarian hormones, estrogen and progesterone, and not ovulating every month. Other causes include:  Tumor of the pituitary gland in the brain.  Medical disease that affects the ovaries.  Radiation treatment.  Chemotherapy.  Unknown causes.  Heavy smoking and excessive alcohol intake can bring on perimenopause sooner. What are the signs or symptoms?  Hot flashes.  Night sweats.  Irregular menstrual periods.  Decreased sex drive.  Vaginal dryness.  Headaches.  Mood swings.  Depression.  Memory problems.  Irritability.  Tiredness.  Weight gain.  Trouble getting pregnant.  The beginning of losing bone cells (osteoporosis).  The beginning of hardening of the arteries (atherosclerosis). How is this diagnosed? Your health care provider will make a diagnosis by analyzing your age, menstrual history, and symptoms. He or she will do a physical exam and note any changes in your body, especially your female organs. Female hormone tests may or may not be helpful depending on the amount of female hormones you produce and when you produce them. However, other hormone tests may be helpful to rule out other problems. How is this treated? In some cases, no treatment is needed. The decision on whether treatment is necessary during the perimenopause should be made by you and your health care  provider based on how the symptoms are affecting you and your lifestyle. Various treatments are available, such as:  Treating individual symptoms with a specific medicine for that symptom.  Herbal medicines that can help specific symptoms.  Counseling.  Group therapy. Follow these instructions at home:  Keep track of your menstrual periods (when they occur, how heavy they are, how long between periods, and how long they last) as well as your symptoms and when they started.  Only take over-the-counter or prescription medicines as directed by your health care provider.  Sleep and rest.  Exercise.  Eat a diet that contains calcium (good for your bones) and soy (acts like the estrogen hormone).  Do not smoke.  Avoid alcoholic beverages.  Take vitamin supplements as recommended by your health care provider. Taking vitamin E may help in certain cases.  Take calcium and vitamin D supplements to help prevent bone loss.  Group therapy is sometimes helpful.  Acupuncture may help in some cases. Contact a health care provider if:  You have questions about any symptoms you are having.  You need a referral to a specialist (gynecologist, psychiatrist, or psychologist). Get help right away if:  You have vaginal bleeding.  Your period lasts longer than 8 days.  Your periods are recurring sooner than 21 days.  You have bleeding after intercourse.  You have severe depression.  You have pain when you urinate.  You have severe headaches.  You have vision problems. This information is not intended to replace advice given to you by your health care provider. Make sure you discuss any questions you have with your health care provider. Document Released: 03/22/2004 Document Revised: 07/21/2015 Document Reviewed: 09/11/2012 Elsevier Interactive   Patient Education  2017 Elsevier Inc.  

## 2017-05-16 NOTE — Progress Notes (Signed)
Patient ID: Kristi Ramos, female   DOB: 04/11/1970, 47 y.o.   MRN: [id number]    Subjective:  I acted as a Education administrator for Dr. Carollee Herter.  Guerry Bruin, [place]   Patient ID: Kristi Ramos, female    DOB: 22-May-1970, 47 y.o.   MRN: [id number]  Chief Complaint  Patient presents with  . medication follow up    fluoxetine    HPI  Patient is in today for follow up on fluoxetine.  Patient states that her mood is much better.  Patient Care Team: Carollee Herter, Alferd Apa, DO as PCP - General Starlyn Skeans, MD as Consulting Physician (Family Medicine)   Past Medical History:  Diagnosis Date  . ADD (attention deficit disorder)   . Anxiety   . Back pain   . Cervical cancer ([place]) 2010  . Chicken pox   . Depression 2009  . Gallbladder problem   . GERD (gastroesophageal reflux disease)   . HTN (hypertension)   . Seasonal allergies   . Uterine cancer ([place]) 2010    Past Surgical History:  Procedure Laterality Date  . ABDOMINAL HYSTERECTOMY  2010   TAH-- cancer  . ANKLE SURGERY Right    Osteomyelitis  . CHOLECYSTECTOMY    . LEG SURGERY     Calf Muscle Surgery    Family History  Problem Relation Age of Onset  . Arthritis Mother   . Stroke Mother   . Hypertension Mother   . Thyroid disease Mother   . Anxiety disorder Mother   . Arthritis Father   . Hyperlipidemia Father   . Heart disease Father   . Heart attack Father 14  . Hypertension Brother   . Drug abuse Son   . Alcohol abuse Paternal Grandmother   . Alcohol abuse Maternal Grandfather   . Heart disease Paternal Grandfather   . Heart attack Paternal Grandfather   . Depression Son     Social History   Socioeconomic History  . Marital status: Married    Spouse name: Elta Guadeloupe   . Number of children: Not on file  . Years of education: Not on file  . Highest education level: Not on file  Occupational History  . Occupation: Insurance claims handler: [employer]    Comment: dental hygiene  Social  Needs  . Financial resource strain: Not on file  . Food insecurity:    Worry: Not on file    Inability: Not on file  . Transportation needs:    Medical: Not on file    Non-medical: Not on file  Tobacco Use  . Smoking status: Never Smoker  . Smokeless tobacco: Never Used  Substance and Sexual Activity  . Alcohol use: Yes    Comment: Occ  . Drug use: No  . Sexual activity: Yes    Partners: Male  Lifestyle  . Physical activity:    Days per week: Not on file    Minutes per session: Not on file  . Stress: Not on file  Relationships  . Social connections:    Talks on phone: Not on file    Gets together: Not on file    Attends religious service: Not on file    Active member of club or organization: Not on file    Attends meetings of clubs or organizations: Not on file    Relationship status: Not on file  . Intimate partner violence:    Fear of current or ex partner: Not on file  Emotionally abused: Not on file    Physically abused: Not on file    Forced sexual activity: Not on file  Other Topics Concern  . Not on file  Social History Narrative   Exercise--- just started back    Outpatient Medications Prior to Visit  Medication Sig Dispense Refill  . famotidine (PEPCID) 20 MG tablet Take 20 mg by mouth 2 (two) times daily as needed for heartburn or indigestion.    . fluticasone (FLONASE) 50 MCG/ACT nasal spray Place 2 sprays into both nostrils daily. 16 g 3  . ibuprofen (ADVIL,MOTRIN) 200 MG tablet Take 200 mg by mouth every 6 (six) hours as needed.    . metFORMIN (GLUCOPHAGE) 500 MG tablet Take 1 tablet (500 mg total) by mouth daily with breakfast. 30 tablet 0  . omeprazole (PRILOSEC) 20 MG capsule TAKE 1 CAPSULE DAILY 90 capsule 1  . traZODone (DESYREL) 50 MG tablet Take 1-2 tablets (50-100 mg total) by mouth at bedtime as needed. for sleep 60 tablet 3  . Vitamin D, Ergocalciferol, (DRISDOL) 50000 units CAPS capsule Take 1 capsule (50,000 Units total) by mouth every 7  (seven) days. 4 capsule 0  . ZYLET 0.5-0.3 % SUSP Place 1 drop into both eyes 3 (three) times daily.    Marland Kitchen FLUoxetine (PROZAC) 20 MG capsule Take 1 capsule (20 mg total) by mouth daily. 30 capsule 3  . lisinopril-hydrochlorothiazide (PRINZIDE,ZESTORETIC) 20-25 MG tablet Take 1 tablet by mouth daily. 90 tablet 1   No facility-administered medications prior to visit.     Allergies  Allergen Reactions  . Moxifloxacin Palpitations and Rash  . Augmentin [Amoxicillin-Pot Clavulanate] Other (See Comments)    tachycardia    Review of Systems  Constitutional: Negative for fever and malaise/fatigue.  HENT: Negative for congestion.   Eyes: Negative for blurred vision.  Respiratory: Negative for cough and shortness of breath.   Cardiovascular: Negative for chest pain, palpitations and leg swelling.  Gastrointestinal: Negative for vomiting.  Musculoskeletal: Negative for back pain.  Skin: Negative for rash.  Neurological: Negative for loss of consciousness and headaches.  Psychiatric/Behavioral: Negative for depression, hallucinations, memory loss, substance abuse and suicidal ideas. The patient is not nervous/anxious and does not have insomnia.        Objective:    Physical Exam  Constitutional: She is oriented to person, place, and time. She appears well-developed and well-nourished.  HENT:  Head: Normocephalic and atraumatic.  Eyes: Conjunctivae and EOM are normal.  Neck: Normal range of motion. Neck supple. No JVD present. Carotid bruit is not present. No thyromegaly present.  Cardiovascular: Normal rate, regular rhythm and normal heart sounds.  No murmur heard. Pulmonary/Chest: Effort normal and breath sounds normal. No respiratory distress. She has no wheezes. She has no rales. She exhibits no tenderness.  Musculoskeletal: She exhibits no edema.  Neurological: She is alert and oriented to person, place, and time.  Psychiatric: She has a normal mood and affect. Her behavior is  normal. Judgment and thought content normal.  Nursing note and vitals reviewed.   BP 128/70 (BP Location: Left Arm, Cuff Size: Large)   Pulse 89   Temp 98 F (36.7 C) (Oral)   Resp 16   Ht 5\' 2"  (1.575 m)   Wt 235 lb 12.8 oz (107 kg)   SpO2 98%   BMI 43.13 kg/m  Wt Readings from Last 3 Encounters:  05/16/17 235 lb 12.8 oz (107 kg)  05/02/17 230 lb (104.3 kg)  04/18/17 230 lb (104.3 kg)  BP Readings from Last 3 Encounters:  05/16/17 128/70  05/02/17 138/83  04/18/17 127/83     Immunization History  Administered Date(s) Administered  . Hepatitis B 08/23/2006, 01/17/2007  . Influenza Whole 01/11/2006, 01/17/2007  . Influenza,inj,Quad PF,6+ Mos 11/20/2013, 01/11/2017  . Influenza-Unspecified 10/28/2014, 11/12/2015  . Td 06/23/2005  . Tdap 06/23/2005, 01/05/2016    Health Maintenance  Topic Date Due  . PNEUMOCOCCAL POLYSACCHARIDE VACCINE (1) 05/10/1972  . FOOT EXAM  05/10/1980  . OPHTHALMOLOGY EXAM  05/10/1980  . HIV Screening  05/10/1985  . HEMOGLOBIN A1C  10/23/2017  . PAP SMEAR  01/06/2018  . MAMMOGRAM  02/15/2018  . TETANUS/TDAP  01/04/2026  . INFLUENZA VACCINE  Completed    Lab Results  Component Value Date   WBC 8.1 11/29/2016   HGB 12.4 11/29/2016   HCT 38.2 11/29/2016   PLT 255.0 01/12/2016   GLUCOSE 149 (H) 04/18/2017   CHOL 156 11/29/2016   TRIG 53 11/29/2016   HDL 53 11/29/2016   LDLCALC 92 11/29/2016   ALT 68 (H) 11/29/2016   AST 47 (H) 11/29/2016   NA 136 04/18/2017   K 3.5 04/18/2017   CL 99 04/18/2017   CREATININE 0.71 04/18/2017   BUN 14 04/18/2017   CO2 28 04/18/2017   TSH 1.710 11/29/2016   HGBA1C 6.1 04/25/2017    Lab Results  Component Value Date   TSH 1.710 11/29/2016   Lab Results  Component Value Date   WBC 8.1 11/29/2016   HGB 12.4 11/29/2016   HCT 38.2 11/29/2016   MCV 93 11/29/2016   PLT 255.0 01/12/2016   Lab Results  Component Value Date   NA 136 04/18/2017   K 3.5 04/18/2017   CO2 28 04/18/2017   GLUCOSE  149 (H) 04/18/2017   BUN 14 04/18/2017   CREATININE 0.71 04/18/2017   BILITOT 0.2 11/29/2016   ALKPHOS 75 11/29/2016   AST 47 (H) 11/29/2016   ALT 68 (H) 11/29/2016   PROT 6.3 11/29/2016   ALBUMIN 4.3 11/29/2016   CALCIUM 9.2 04/18/2017   GFR 93.81 04/18/2017   Lab Results  Component Value Date   CHOL 156 11/29/2016   Lab Results  Component Value Date   HDL 53 11/29/2016   Lab Results  Component Value Date   LDLCALC 92 11/29/2016   Lab Results  Component Value Date   TRIG 53 11/29/2016   Lab Results  Component Value Date   CHOLHDL 3 01/12/2016   Lab Results  Component Value Date   HGBA1C 6.1 04/25/2017         Assessment & Plan:   Problem List Items Addressed This Visit      Unprioritized   Essential hypertension    Well controlled, no changes to meds. Encouraged heart healthy diet such as the DASH diet and exercise as tolerated.       Relevant Medications   lisinopril-hydrochlorothiazide (PRINZIDE,ZESTORETIC) 20-25 MG tablet   Other specified menopausal and perimenopausal disorders    con't prozac  rto 3 month      Relevant Medications   FLUoxetine (PROZAC) 20 MG capsule      I am having Nadia B. Brazee maintain her famotidine, ibuprofen, omeprazole, traZODone, fluticasone, ZYLET, Vitamin D (Ergocalciferol), metFORMIN, FLUoxetine, and lisinopril-hydrochlorothiazide.  Meds ordered this encounter  Medications  . FLUoxetine (PROZAC) 20 MG capsule    Sig: Take 1 capsule (20 mg total) by mouth daily.    Dispense:  90 capsule    Refill:  3  . lisinopril-hydrochlorothiazide (  PRINZIDE,ZESTORETIC) 20-25 MG tablet    Sig: Take 1 tablet by mouth daily.    Dispense:  90 tablet    Refill:  1    Deactivate previous lower dosage    CMA served as scribe during this visit. History, Physical and Plan performed by medical provider. Documentation and orders reviewed and attested to.  Ann Held, DO

## 2017-05-23 ENCOUNTER — Ambulatory Visit (INDEPENDENT_AMBULATORY_CARE_PROVIDER_SITE_OTHER): Payer: 59 | Admitting: Physician Assistant

## 2017-05-23 VITALS — BP 138/80 | HR 70 | Temp 97.8°F | Ht 62.0 in | Wt 230.0 lb

## 2017-05-23 DIAGNOSIS — R7303 Prediabetes: Secondary | ICD-10-CM | POA: Diagnosis not present

## 2017-05-23 DIAGNOSIS — Z9189 Other specified personal risk factors, not elsewhere classified: Secondary | ICD-10-CM | POA: Diagnosis not present

## 2017-05-23 DIAGNOSIS — E559 Vitamin D deficiency, unspecified: Secondary | ICD-10-CM

## 2017-05-23 DIAGNOSIS — Z6841 Body Mass Index (BMI) 40.0 and over, adult: Secondary | ICD-10-CM

## 2017-05-23 MED ORDER — METFORMIN HCL 500 MG PO TABS: 500.0000 mg | ORAL_TABLET | Freq: Every day | ORAL | 0 refills | Status: DC

## 2017-05-23 NOTE — Progress Notes (Signed)
Office: [phone]  /  Fax: [phone]   HPI:   Chief Complaint: OBESITY Kristi Ramos is here to discuss her progress with her obesity treatment plan. She is on the  keep a food journal with 500 calories and 40 grams of protein  At supper and the Category 2 plan and is following her eating plan approximately 50 % of the time. She states she is exercising 0 minutes 0 times per week. Faven maintained her weight and she continues to have challenges meeting the required protein at dinner. Her weight is 230 lb (104.3 kg) today and has maintained weight over a period of 3 weeks since her last visit. She has lost 16 lbs since starting treatment with [place].  Pre-Diabetes Kristi Ramos has a diagnosis of pre-diabetes based on her elevated Hgb A1c and was informed this puts her at greater risk of developing diabetes. She is taking metformin currently and continues to work on diet and exercise to decrease risk of diabetes. She denies polyphagia.  At risk for diabetes Kristi Ramos is at higher than average risk for developing diabetes due to her obesity. She currently denies polyuria or polydipsia.  Vitamin D deficiency Kristi Ramos has a diagnosis of vitamin D deficiency. She is currently taking vit D and denies nausea, vomiting or muscle weakness.  ALLERGIES: Allergies  Allergen Reactions  . Moxifloxacin Palpitations and Rash  . Augmentin [Amoxicillin-Pot Clavulanate] Other (See Comments)    tachycardia    MEDICATIONS: Current Outpatient Medications on File Prior to Visit  Medication Sig Dispense Refill  . famotidine (PEPCID) 20 MG tablet Take 20 mg by mouth 2 (two) times daily as needed for heartburn or indigestion.    Marland Kitchen FLUoxetine (PROZAC) 20 MG capsule Take 1 capsule (20 mg total) by mouth daily. 90 capsule 3  . fluticasone (FLONASE) 50 MCG/ACT nasal spray Place 2 sprays into both nostrils daily. 16 g 3  . ibuprofen (ADVIL,MOTRIN) 200 MG tablet Take 200 mg by mouth every 6 (six) hours as needed.    Marland Kitchen  lisinopril-hydrochlorothiazide (PRINZIDE,ZESTORETIC) 20-25 MG tablet Take 1 tablet by mouth daily. 90 tablet 1  . metFORMIN (GLUCOPHAGE) 500 MG tablet Take 1 tablet (500 mg total) by mouth daily with breakfast. 30 tablet 0  . omeprazole (PRILOSEC) 20 MG capsule TAKE 1 CAPSULE DAILY 90 capsule 1  . traZODone (DESYREL) 50 MG tablet Take 1-2 tablets (50-100 mg total) by mouth at bedtime as needed. for sleep 60 tablet 3  . Vitamin D, Ergocalciferol, (DRISDOL) 50000 units CAPS capsule Take 1 capsule (50,000 Units total) by mouth every 7 (seven) days. 4 capsule 0  . ZYLET 0.5-0.3 % SUSP Place 1 drop into both eyes 3 (three) times daily.     No current facility-administered medications on file prior to visit.     PAST MEDICAL HISTORY: Past Medical History:  Diagnosis Date  . ADD (attention deficit disorder)   . Anxiety   . Back pain   . Cervical cancer ([place]) 2010  . Chicken pox   . Depression 2009  . Gallbladder problem   . GERD (gastroesophageal reflux disease)   . HTN (hypertension)   . Seasonal allergies   . Uterine cancer ([place]) 2010    PAST SURGICAL HISTORY: Past Surgical History:  Procedure Laterality Date  . ABDOMINAL HYSTERECTOMY  2010   TAH-- cancer  . ANKLE SURGERY Right    Osteomyelitis  . CHOLECYSTECTOMY    . LEG SURGERY     Calf Muscle Surgery    SOCIAL HISTORY: Social History  Tobacco Use  . Smoking status: Never Smoker  . Smokeless tobacco: Never Used  Substance Use Topics  . Alcohol use: Yes    Comment: Occ  . Drug use: No    FAMILY HISTORY: Family History  Problem Relation Age of Onset  . Arthritis Mother   . Stroke Mother   . Hypertension Mother   . Thyroid disease Mother   . Anxiety disorder Mother   . Arthritis Father   . Hyperlipidemia Father   . Heart disease Father   . Heart attack Father 33  . Hypertension Brother   . Drug abuse Son   . Alcohol abuse Paternal Grandmother   . Alcohol abuse Maternal Grandfather   . Heart disease  Paternal Grandfather   . Heart attack Paternal Grandfather   . Depression Son     ROS: Review of Systems  Constitutional: Negative for weight loss.  Gastrointestinal: Negative for nausea and vomiting.  Genitourinary: Negative for frequency.  Musculoskeletal:       Negative for muscle weakness  Endo/Heme/Allergies: Negative for polydipsia.       Negative for polyphagia    PHYSICAL EXAM: Blood pressure 138/80, pulse 70, temperature 97.8 F (36.6 C), temperature source Oral, height 5\' 2"  (1.575 m), weight 230 lb (104.3 kg), SpO2 97 %. Body mass index is 42.07 kg/m. Physical Exam  Constitutional: She is oriented to person, place, and time. She appears well-developed and well-nourished.  Cardiovascular: Normal rate.  Pulmonary/Chest: Effort normal.  Musculoskeletal: Normal range of motion.  Neurological: She is oriented to person, place, and time.  Skin: Skin is warm and dry.  Psychiatric: She has a normal mood and affect. Her behavior is normal.  Vitals reviewed.   RECENT LABS AND TESTS: BMET    Component Value Date/Time   NA 136 04/18/2017 1021   NA 141 11/29/2016 0945   K 3.5 04/18/2017 1021   CL 99 04/18/2017 1021   CO2 28 04/18/2017 1021   GLUCOSE 149 (H) 04/18/2017 1021   BUN 14 04/18/2017 1021   BUN 10 11/29/2016 0945   CREATININE 0.71 04/18/2017 1021   CALCIUM 9.2 04/18/2017 1021   GFRNONAA 105 11/29/2016 0945   GFRAA 121 11/29/2016 0945   Lab Results  Component Value Date   HGBA1C 6.1 04/25/2017   HGBA1C 5.6 11/29/2016   HGBA1C 5.8 12/24/2013   Lab Results  Component Value Date   INSULIN 15.9 11/29/2016   CBC    Component Value Date/Time   WBC 8.1 11/29/2016 0945   WBC 5.0 01/12/2016 0952   RBC 4.13 11/29/2016 0945   RBC 4.55 01/12/2016 0952   HGB 12.4 11/29/2016 0945   HCT 38.2 11/29/2016 0945   PLT 255.0 01/12/2016 0952   MCV 93 11/29/2016 0945   MCH 30.0 11/29/2016 0945   MCHC 32.5 11/29/2016 0945   MCHC 34.1 01/12/2016 0952   RDW 13.7  11/29/2016 0945   LYMPHSABS 1.1 11/29/2016 0945   MONOABS 0.4 01/12/2016 0952   EOSABS 0.1 11/29/2016 0945   BASOSABS 0.0 11/29/2016 0945   Iron/TIBC/Ferritin/ %Sat No results found for: IRON, TIBC, FERRITIN, IRONPCTSAT Lipid Panel     Component Value Date/Time   CHOL 156 11/29/2016 0945   TRIG 53 11/29/2016 0945   HDL 53 11/29/2016 0945   CHOLHDL 3 01/12/2016 0952   VLDL 14.6 01/12/2016 0952   LDLCALC 92 11/29/2016 0945   Hepatic Function Panel     Component Value Date/Time   PROT 6.3 11/29/2016 0945   ALBUMIN 4.3 11/29/2016  0945   AST 47 (H) 11/29/2016 0945   ALT 68 (H) 11/29/2016 0945   ALKPHOS 75 11/29/2016 0945   BILITOT 0.2 11/29/2016 0945   BILIDIR 0.0 11/20/2013 0921      Component Value Date/Time   TSH 1.710 11/29/2016 0945   TSH 2.22 01/12/2016 0952   TSH 1.87 01/13/2015 1036   Results for LILYTH, LAWYER (MRN [id number]) as of 05/23/2017 10:43  Ref. Range 11/29/2016 09:45  Vitamin D, 25-Hydroxy Latest Ref Range: 30.0 - 100.0 ng/mL 31.7   ASSESSMENT AND PLAN: Prediabetes - Plan: metFORMIN (GLUCOPHAGE) 500 MG tablet  Vitamin D deficiency  At risk for diabetes mellitus  Class 3 severe obesity with serious comorbidity and body mass index (BMI) of 40.0 to 44.9 in adult, unspecified obesity type Kristi Ramos)  PLAN:  Pre-Diabetes Kristi Ramos will continue to work on weight loss, exercise, and decreasing simple carbohydrates in her diet to help decrease the risk of diabetes. We dicussed metformin including benefits and risks. She was informed that eating too many simple carbohydrates or too many calories at one sitting increases the likelihood of GI side effects. Kristi Ramos requested metformin for now and a prescription was written today. Kristi Ramos agreed to follow up with [place] as directed to monitor her progress.  Diabetes risk counseling Kristi Ramos was given extended (15 minutes) diabetes prevention counseling today. She is 47 y.o. female and has risk factors for diabetes including obesity  and pre-diabetes. We discussed intensive lifestyle modifications today with an emphasis on weight loss as well as increasing exercise and decreasing simple carbohydrates in her diet.  Vitamin D Deficiency Kristi Ramos was informed that low vitamin D levels contributes to fatigue and are associated with obesity, breast, and colon cancer. She agrees to continue to take prescription Vit D @50 ,000 IU every week and will follow up for routine testing of vitamin D, at least 2-3 times per year. She was informed of the risk of over-replacement of vitamin D and agrees to not increase her dose unless she discusses this with [place] first.  Obesity Kristi Ramos is currently in the action stage of change. As such, her goal is to continue with weight loss efforts She has agreed to follow the Pescatarian eating plan Kristi Ramos has been instructed to work up to a goal of 150 minutes of combined cardio and strengthening exercise per week for weight loss and overall health benefits. We discussed the following Behavioral Modification Strategies today: increasing lean protein intake and work on meal planning and easy cooking plans  Kristi Ramos has agreed to follow up with our clinic in 2 weeks. She was informed of the importance of frequent follow up visits to maximize her success with intensive lifestyle modifications for her multiple health conditions.   OBESITY BEHAVIORAL INTERVENTION VISIT  Today's visit was # 10 out of 22.  Starting weight: 246 lbs Starting date: 11/29/16 Today's weight : 230 lbs Today's date: 05/23/2017 Total lbs lost to date: 16 (Patients must lose 7 lbs in the first 6 months to continue with counseling)   ASK: We discussed the diagnosis of obesity with Kristi Ramos today and Kristi Ramos agreed to give [place] permission to discuss obesity behavioral modification therapy today.  ASSESS: Kristi Ramos has the diagnosis of obesity and her BMI today is 42.06 Kristi Ramos is in the action stage of change   ADVISE: Kristi Ramos was educated on the  multiple health risks of obesity as well as the benefit of weight loss to improve her health. She was advised of the need for long  term treatment and the importance of lifestyle modifications.  AGREE: Multiple dietary modification options and treatment options were discussed and  Kristi Ramos agreed to the above obesity treatment plan.   Corey Skains, am acting as transcriptionist for [employer], PA-C I, Lacy Duverney [facility], have reviewed this note and agree with its content

## 2017-06-02 ENCOUNTER — Other Ambulatory Visit (INDEPENDENT_AMBULATORY_CARE_PROVIDER_SITE_OTHER): Payer: Self-pay | Admitting: Physician Assistant

## 2017-06-02 DIAGNOSIS — R7303 Prediabetes: Secondary | ICD-10-CM

## 2017-06-03 ENCOUNTER — Telehealth (INDEPENDENT_AMBULATORY_CARE_PROVIDER_SITE_OTHER): Payer: Self-pay | Admitting: Physician Assistant

## 2017-06-03 NOTE — Telephone Encounter (Signed)
Pt is requesting a refill of her Metformin.  Her pharmacy is [facility] [address]. Thank you. Maudie Mercury

## 2017-06-04 ENCOUNTER — Encounter (INDEPENDENT_AMBULATORY_CARE_PROVIDER_SITE_OTHER): Payer: Self-pay

## 2017-06-04 ENCOUNTER — Encounter: Payer: Self-pay | Admitting: Family Medicine

## 2017-06-04 ENCOUNTER — Ambulatory Visit: Payer: 59 | Admitting: Family Medicine

## 2017-06-04 VITALS — BP 138/88 | HR 88 | Temp 99.3°F | Resp 16 | Ht 62.0 in | Wt 236.2 lb

## 2017-06-04 DIAGNOSIS — L0291 Cutaneous abscess, unspecified: Secondary | ICD-10-CM

## 2017-06-04 LAB — CBC WITH DIFFERENTIAL/PLATELET
BASOS PCT: 0.4 % (ref 0.0–3.0)
Basophils Absolute: 0 10*3/uL (ref 0.0–0.1)
EOS PCT: 0.5 % (ref 0.0–5.0)
Eosinophils Absolute: 0 10*3/uL (ref 0.0–0.7)
HCT: 37.1 % (ref 36.0–46.0)
Hemoglobin: 12.7 g/dL (ref 12.0–15.0)
LYMPHS ABS: 1.4 10*3/uL (ref 0.7–4.0)
Lymphocytes Relative: 15.5 % (ref 12.0–46.0)
MCHC: 34.3 g/dL (ref 30.0–36.0)
MCV: 88.6 fl (ref 78.0–100.0)
MONO ABS: 0.5 10*3/uL (ref 0.1–1.0)
MONOS PCT: 5 % (ref 3.0–12.0)
NEUTROS ABS: 7.1 10*3/uL (ref 1.4–7.7)
NEUTROS PCT: 78.6 % — AB (ref 43.0–77.0)
PLATELETS: 297 10*3/uL (ref 150.0–400.0)
RBC: 4.19 Mil/uL (ref 3.87–5.11)
RDW: 13.9 % (ref 11.5–15.5)
WBC: 9 10*3/uL (ref 4.0–10.5)

## 2017-06-04 MED ORDER — FLUCONAZOLE 150 MG PO TABS: ORAL_TABLET | ORAL | 0 refills | Status: DC

## 2017-06-04 MED ORDER — CEPHALEXIN 500 MG PO CAPS: 500.0000 mg | ORAL_CAPSULE | Freq: Two times a day (BID) | ORAL | 0 refills | Status: DC

## 2017-06-04 NOTE — Telephone Encounter (Signed)
Sent the patient a my chart message. Kristi Ramos, [place]

## 2017-06-04 NOTE — Patient Instructions (Signed)

## 2017-06-04 NOTE — Progress Notes (Signed)
Patient ID: Kristi Ramos, female   DOB: Mar 25, 1970, 47 y.o.   MRN: [id number]     Subjective:  I acted as a Education administrator for Dr. Carollee Herter.  Guerry Bruin, [place]   Patient ID: Kristi Ramos, female    DOB: [birth year], 47 y.o.   MRN: [id number]  Chief Complaint  Patient presents with  . knot right upper buttock    HPI  Patient is in today for knot on upper right buttock.  She noticed yesterday.  She felt pain earlier yesterday and took an Advil.  She noticed after taking a shower she felt a knot back there.  It was warm this am---- yesterday it was bigger    There was also some errythema earlier  Patient Care Team: Carollee Herter, Alferd Apa, DO as PCP - General Starlyn Skeans, MD as Consulting Physician (Family Medicine)   Past Medical History:  Diagnosis Date  . ADD (attention deficit disorder)   . Anxiety   . Back pain   . Cervical cancer ([place]) 2010  . Chicken pox   . Depression 2009  . Gallbladder problem   . GERD (gastroesophageal reflux disease)   . HTN (hypertension)   . Seasonal allergies   . Uterine cancer ([place]) 2010    Past Surgical History:  Procedure Laterality Date  . ABDOMINAL HYSTERECTOMY  2010   TAH-- cancer  . ANKLE SURGERY Right    Osteomyelitis  . CHOLECYSTECTOMY    . LEG SURGERY     Calf Muscle Surgery    Family History  Problem Relation Age of Onset  . Arthritis Mother   . Stroke Mother   . Hypertension Mother   . Thyroid disease Mother   . Anxiety disorder Mother   . Arthritis Father   . Hyperlipidemia Father   . Heart disease Father   . Heart attack Father 27  . Hypertension Brother   . Drug abuse Son   . Alcohol abuse Paternal Grandmother   . Alcohol abuse Maternal Grandfather   . Heart disease Paternal Grandfather   . Heart attack Paternal Grandfather   . Depression Son     Social History   Socioeconomic History  . Marital status: Married    Spouse name: Elta Guadeloupe   . Number of children: Not on file  . Years of education:  Not on file  . Highest education level: Not on file  Occupational History  . Occupation: Insurance claims handler: [employer]    Comment: dental hygiene  Social Needs  . Financial resource strain: Not on file  . Food insecurity:    Worry: Not on file    Inability: Not on file  . Transportation needs:    Medical: Not on file    Non-medical: Not on file  Tobacco Use  . Smoking status: Never Smoker  . Smokeless tobacco: Never Used  Substance and Sexual Activity  . Alcohol use: Yes    Comment: Occ  . Drug use: No  . Sexual activity: Yes    Partners: Male  Lifestyle  . Physical activity:    Days per week: Not on file    Minutes per session: Not on file  . Stress: Not on file  Relationships  . Social connections:    Talks on phone: Not on file    Gets together: Not on file    Attends religious service: Not on file    Active member of club or organization: Not on file  Attends meetings of clubs or organizations: Not on file    Relationship status: Not on file  . Intimate partner violence:    Fear of current or ex partner: Not on file    Emotionally abused: Not on file    Physically abused: Not on file    Forced sexual activity: Not on file  Other Topics Concern  . Not on file  Social History Narrative   Exercise--- just started back    Outpatient Medications Prior to Visit  Medication Sig Dispense Refill  . famotidine (PEPCID) 20 MG tablet Take 20 mg by mouth 2 (two) times daily as needed for heartburn or indigestion.    Marland Kitchen FLUoxetine (PROZAC) 20 MG capsule Take 1 capsule (20 mg total) by mouth daily. 90 capsule 3  . fluticasone (FLONASE) 50 MCG/ACT nasal spray Place 2 sprays into both nostrils daily. 16 g 3  . ibuprofen (ADVIL,MOTRIN) 200 MG tablet Take 200 mg by mouth every 6 (six) hours as needed.    Marland Kitchen lisinopril-hydrochlorothiazide (PRINZIDE,ZESTORETIC) 20-25 MG tablet Take 1 tablet by mouth daily. 90 tablet 1  . metFORMIN (GLUCOPHAGE) 500 MG tablet  Take 1 tablet (500 mg total) by mouth daily with breakfast. 30 tablet 0  . omeprazole (PRILOSEC) 20 MG capsule TAKE 1 CAPSULE DAILY 90 capsule 1  . traZODone (DESYREL) 50 MG tablet Take 1-2 tablets (50-100 mg total) by mouth at bedtime as needed. for sleep 60 tablet 3  . Vitamin D, Ergocalciferol, (DRISDOL) 50000 units CAPS capsule Take 1 capsule (50,000 Units total) by mouth every 7 (seven) days. 4 capsule 0  . ZYLET 0.5-0.3 % SUSP Place 1 drop into both eyes 3 (three) times daily.     No facility-administered medications prior to visit.     Allergies  Allergen Reactions  . Moxifloxacin Palpitations and Rash  . Augmentin [Amoxicillin-Pot Clavulanate] Other (See Comments)    tachycardia    Review of Systems  Constitutional: Negative for fever and malaise/fatigue.  HENT: Negative for congestion.   Eyes: Negative for blurred vision.  Respiratory: Negative for cough and shortness of breath.   Cardiovascular: Negative for chest pain, palpitations and leg swelling.  Gastrointestinal: Negative for vomiting.  Musculoskeletal: Negative for back pain.  Skin: Negative for rash.       Knot upper right buttock  Neurological: Negative for loss of consciousness and headaches.       Objective:    Physical Exam  Skin:     Nursing note and vitals reviewed.   BP 138/88 (BP Location: Left Arm, Cuff Size: Normal)   Pulse 88   Temp 99.3 F (37.4 C) (Oral)   Resp 16   Ht 5\' 2"  (1.575 m)   Wt 236 lb 3.2 oz (107.1 kg)   SpO2 97%   BMI 43.20 kg/m  Wt Readings from Last 3 Encounters:  06/04/17 236 lb 3.2 oz (107.1 kg)  05/23/17 230 lb (104.3 kg)  05/16/17 235 lb 12.8 oz (107 kg)   BP Readings from Last 3 Encounters:  06/04/17 138/88  05/23/17 138/80  05/16/17 128/70     Immunization History  Administered Date(s) Administered  . Hepatitis B 08/23/2006, 01/17/2007  . Influenza Whole 01/11/2006, 01/17/2007  . Influenza,inj,Quad PF,6+ Mos 11/20/2013, 01/11/2017  .  Influenza-Unspecified 10/28/2014, 11/12/2015  . Td 06/23/2005  . Tdap 06/23/2005, 01/05/2016    Health Maintenance  Topic Date Due  . PNEUMOCOCCAL POLYSACCHARIDE VACCINE (1) 05/10/1972  . FOOT EXAM  05/10/1980  . OPHTHALMOLOGY EXAM  05/10/1980  .  HIV Screening  05/10/1985  . INFLUENZA VACCINE  09/26/2017  . HEMOGLOBIN A1C  10/23/2017  . PAP SMEAR  01/06/2018  . MAMMOGRAM  02/15/2018  . TETANUS/TDAP  01/04/2026    Lab Results  Component Value Date   WBC 8.1 11/29/2016   HGB 12.4 11/29/2016   HCT 38.2 11/29/2016   PLT 255.0 01/12/2016   GLUCOSE 149 (H) 04/18/2017   CHOL 156 11/29/2016   TRIG 53 11/29/2016   HDL 53 11/29/2016   LDLCALC 92 11/29/2016   ALT 68 (H) 11/29/2016   AST 47 (H) 11/29/2016   NA 136 04/18/2017   K 3.5 04/18/2017   CL 99 04/18/2017   CREATININE 0.71 04/18/2017   BUN 14 04/18/2017   CO2 28 04/18/2017   TSH 1.710 11/29/2016   HGBA1C 6.1 04/25/2017    Lab Results  Component Value Date   TSH 1.710 11/29/2016   Lab Results  Component Value Date   WBC 8.1 11/29/2016   HGB 12.4 11/29/2016   HCT 38.2 11/29/2016   MCV 93 11/29/2016   PLT 255.0 01/12/2016   Lab Results  Component Value Date   NA 136 04/18/2017   K 3.5 04/18/2017   CO2 28 04/18/2017   GLUCOSE 149 (H) 04/18/2017   BUN 14 04/18/2017   CREATININE 0.71 04/18/2017   BILITOT 0.2 11/29/2016   ALKPHOS 75 11/29/2016   AST 47 (H) 11/29/2016   ALT 68 (H) 11/29/2016   PROT 6.3 11/29/2016   ALBUMIN 4.3 11/29/2016   CALCIUM 9.2 04/18/2017   GFR 93.81 04/18/2017   Lab Results  Component Value Date   CHOL 156 11/29/2016   Lab Results  Component Value Date   HDL 53 11/29/2016   Lab Results  Component Value Date   LDLCALC 92 11/29/2016   Lab Results  Component Value Date   TRIG 53 11/29/2016   Lab Results  Component Value Date   CHOLHDL 3 01/12/2016   Lab Results  Component Value Date   HGBA1C 6.1 04/25/2017         Assessment & Plan:   Problem List Items  Addressed This Visit    None    Visit Diagnoses    Abscess    -  Primary   Relevant Medications   cephALEXin (KEFLEX) 500 MG capsule   Other Relevant Orders   CBC with Differential/Platelet    warm compresses  If not relief consider MRI  I am having Makhayla B. Griffy start on cephALEXin and fluconazole. I am also having her maintain her famotidine, ibuprofen, omeprazole, traZODone, fluticasone, ZYLET, Vitamin D (Ergocalciferol), FLUoxetine, lisinopril-hydrochlorothiazide, and metFORMIN.  Meds ordered this encounter  Medications  . cephALEXin (KEFLEX) 500 MG capsule    Sig: Take 1 capsule (500 mg total) by mouth 2 (two) times daily.    Dispense:  20 capsule    Refill:  0  . fluconazole (DIFLUCAN) 150 MG tablet    Sig: 1 po x1, may repeat in 3 days prn    Dispense:  2 tablet    Refill:  0    CMA served as scribe during this visit. History, Physical and Plan performed by medical provider. Documentation and orders reviewed and attested to.  Ann Held, DO

## 2017-06-06 ENCOUNTER — Ambulatory Visit (INDEPENDENT_AMBULATORY_CARE_PROVIDER_SITE_OTHER): Payer: 59 | Admitting: Physician Assistant

## 2017-06-27 ENCOUNTER — Other Ambulatory Visit: Payer: Self-pay | Admitting: Family Medicine

## 2017-06-27 DIAGNOSIS — K1370 Unspecified lesions of oral mucosa: Secondary | ICD-10-CM | POA: Diagnosis not present

## 2017-06-27 DIAGNOSIS — K219 Gastro-esophageal reflux disease without esophagitis: Secondary | ICD-10-CM

## 2017-07-02 DIAGNOSIS — K1379 Other lesions of oral mucosa: Secondary | ICD-10-CM | POA: Diagnosis not present

## 2017-07-02 DIAGNOSIS — J019 Acute sinusitis, unspecified: Secondary | ICD-10-CM | POA: Diagnosis not present

## 2017-07-02 DIAGNOSIS — I10 Essential (primary) hypertension: Secondary | ICD-10-CM | POA: Diagnosis not present

## 2017-07-04 DIAGNOSIS — K1379 Other lesions of oral mucosa: Secondary | ICD-10-CM | POA: Diagnosis not present

## 2017-07-04 DIAGNOSIS — K136 Irritative hyperplasia of oral mucosa: Secondary | ICD-10-CM | POA: Diagnosis not present

## 2017-07-12 ENCOUNTER — Ambulatory Visit: Payer: 59 | Admitting: Family Medicine

## 2017-07-22 IMAGING — MG 2D DIGITAL DIAGNOSTIC BILATERAL MAMMOGRAM WITH CAD AND ADJUNCT T
8 of 12 series · 8 of 28 positions shown · non-contrast
Comparison: Previous exam(s).

CLINICAL DATA: One year follow-up of a left breast asymmetry

EXAM:
2D DIGITAL DIAGNOSTIC BILATERAL MAMMOGRAM WITH CAD AND ADJUNCT TOMO

[R CC]
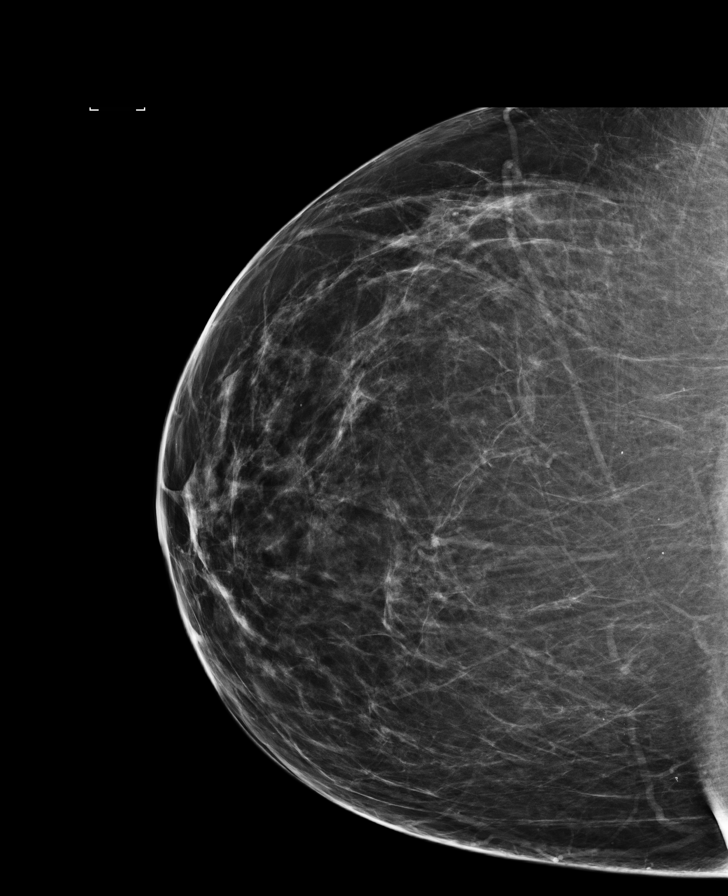

[L CC synth-2D]
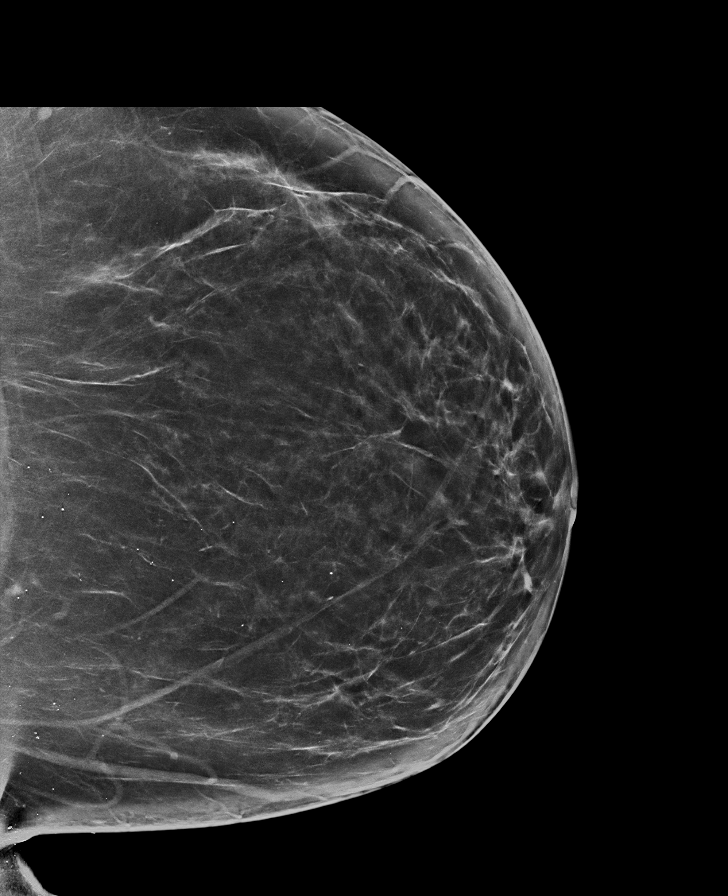

[R CC synth-2D]
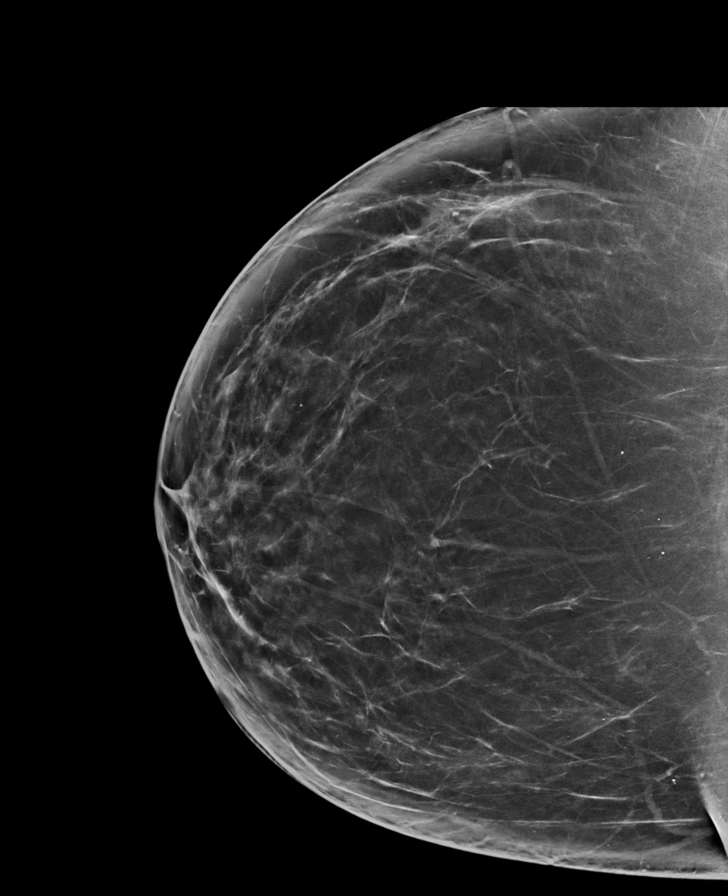

[L MLO synth-2D]
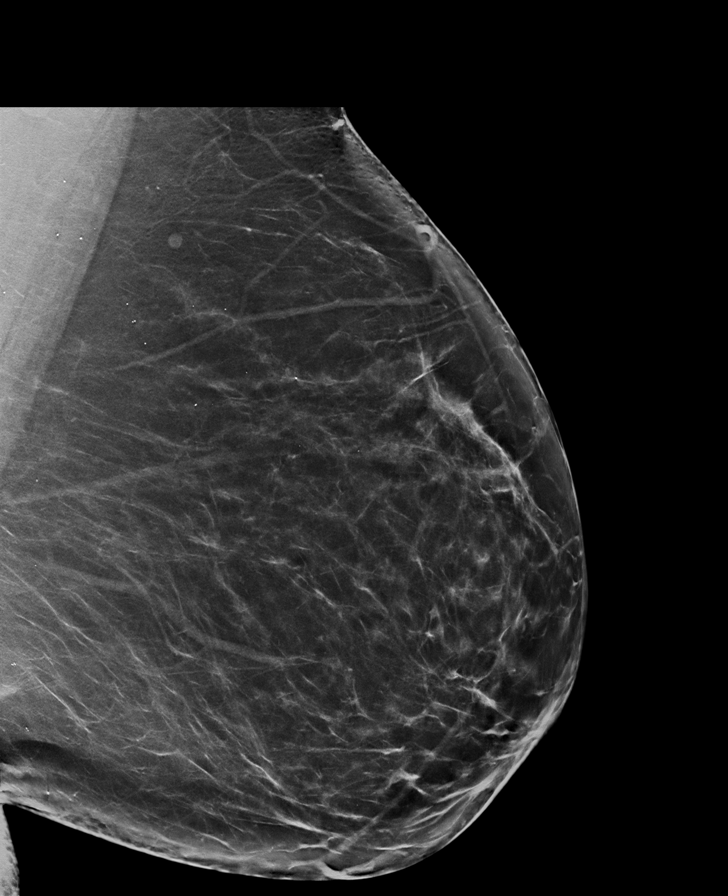

[L MLO]
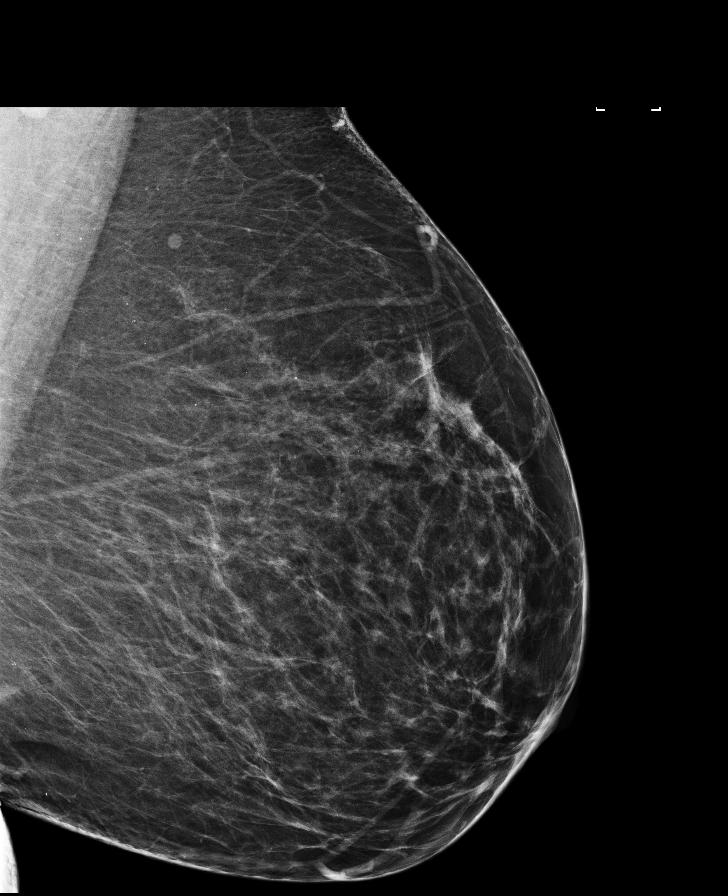

[L CC]
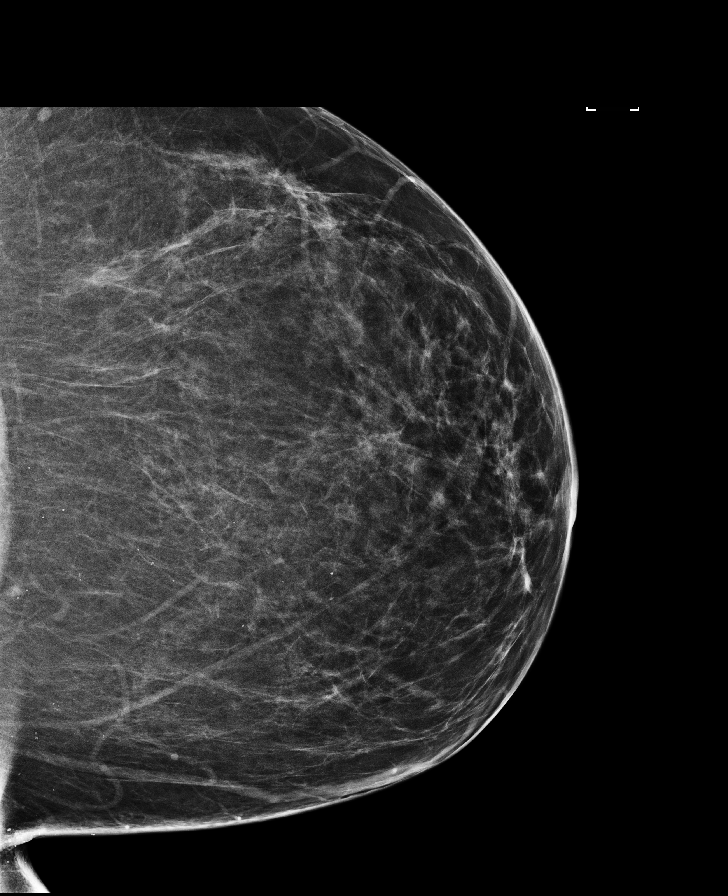

[R MLO synth-2D]
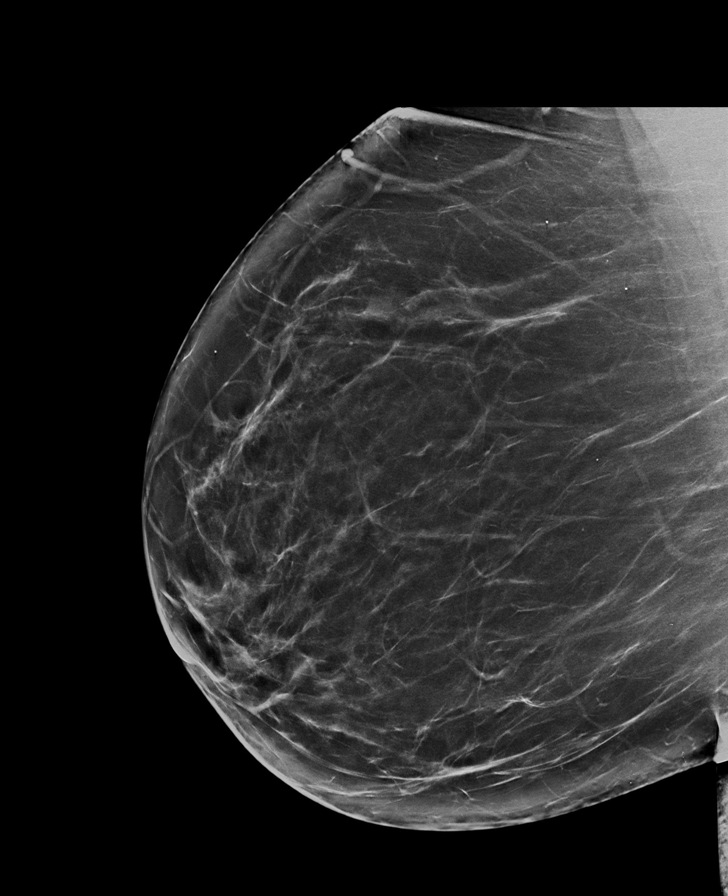

[R MLO]
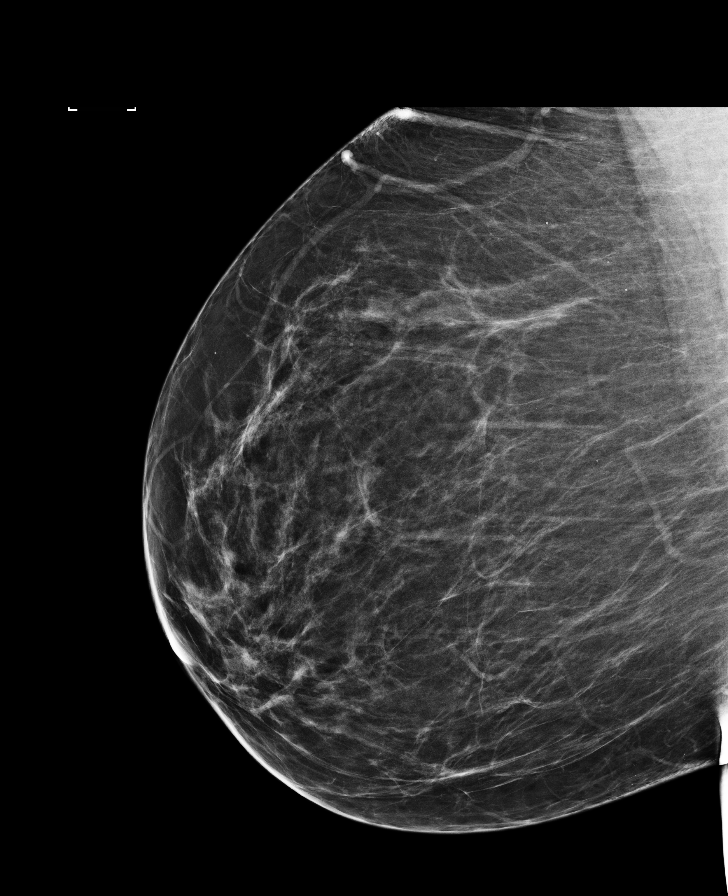

[8 of 28 positions shown; findings below may reference images not displayed]

ACR Breast Density Category b: There are scattered areas of
fibroglandular density.
FINDINGS: The asymmetry in the deep left breast, slightly medial to the
nipple, is again seen. I suspect a fatty hilum. This is most likely
a small lymph node. It measures slightly larger today, measuring
between 4.5 and 5 mm versus 4 mm previously. The tiny difference in
size/conspicuity could represent difference in positioning and
technique.

Mammographic images were processed with CAD.
IMPRESSION: The asymmetry in the medial left breast at a posterior depth is very
similar in appearance. The slight increased conspicuity may be
positional/technical. No other suspicious findings.

RECOMMENDATION:
Six-month follow-up mammogram of the left breast to ensure further
stability of the posterior slightly medial left asymmetry.

I have discussed the findings and recommendations with the patient.
Results were also provided in writing at the conclusion of the
visit. If applicable, a reminder letter will be sent to the patient
regarding the next appointment.

BI-RADS CATEGORY  3: Probably benign.

## 2017-07-23 DIAGNOSIS — J069 Acute upper respiratory infection, unspecified: Secondary | ICD-10-CM | POA: Diagnosis not present

## 2017-08-08 ENCOUNTER — Encounter: Payer: Self-pay | Admitting: Family Medicine

## 2017-08-08 ENCOUNTER — Ambulatory Visit: Payer: 59 | Admitting: Family Medicine

## 2017-08-08 VITALS — BP 123/79 | HR 73 | Temp 98.7°F | Resp 16 | Wt 234.0 lb

## 2017-08-08 DIAGNOSIS — R739 Hyperglycemia, unspecified: Secondary | ICD-10-CM | POA: Diagnosis not present

## 2017-08-08 DIAGNOSIS — I10 Essential (primary) hypertension: Secondary | ICD-10-CM | POA: Diagnosis not present

## 2017-08-08 DIAGNOSIS — J302 Other seasonal allergic rhinitis: Secondary | ICD-10-CM

## 2017-08-08 DIAGNOSIS — L729 Follicular cyst of the skin and subcutaneous tissue, unspecified: Secondary | ICD-10-CM | POA: Diagnosis not present

## 2017-08-08 LAB — COMPREHENSIVE METABOLIC PANEL
ALT: 56 U/L — AB (ref 0–35)
AST: 33 U/L (ref 0–37)
Albumin: 4.3 g/dL (ref 3.5–5.2)
Alkaline Phosphatase: 74 U/L (ref 39–117)
BILIRUBIN TOTAL: 0.5 mg/dL (ref 0.2–1.2)
BUN: 20 mg/dL (ref 6–23)
CHLORIDE: 99 meq/L (ref 96–112)
CO2: 28 meq/L (ref 19–32)
Calcium: 9 mg/dL (ref 8.4–10.5)
Creatinine, Ser: 0.79 mg/dL (ref 0.40–1.20)
GFR: 82.82 mL/min (ref >60.00)
GLUCOSE: 115 mg/dL — AB (ref 70–99)
POTASSIUM: 4.2 meq/L (ref 3.5–5.1)
Sodium: 136 mEq/L (ref 135–145)
Total Protein: 6.6 g/dL (ref 6.0–8.3)

## 2017-08-08 LAB — LIPID PANEL
CHOL/HDL RATIO: 2
Cholesterol: 171 mg/dL (ref 0–200)
HDL: 72.4 mg/dL (ref >39.00)
LDL Cholesterol: 87 mg/dL (ref 0–99)
NONHDL: 98.5
Triglycerides: 56 mg/dL (ref 0.0–149.0)
VLDL: 11.2 mg/dL (ref 0.0–40.0)

## 2017-08-08 LAB — HEMOGLOBIN A1C: HEMOGLOBIN A1C: 5.9 % (ref 4.6–6.5)

## 2017-08-08 NOTE — Progress Notes (Signed)
Subjective:  I acted as a Education administrator for [employer]. Yancey Flemings, [place]   Patient ID: Kristi Ramos, female    DOB: 09/03/1970, 47 y.o.   MRN: [id number]  Chief Complaint  Patient presents with  . Hypertension    HPI  Patient is in today for follow up on hypertension. She also states the knot on her r upper buttock keeps coming back.  It goes away with abx and warm compresses but then comes back with abx are done.   Patient Care Team: Carollee Herter, Alferd Apa, DO as PCP - General Starlyn Skeans, MD as Consulting Physician (Family Medicine)   Past Medical History:  Diagnosis Date  . ADD (attention deficit disorder)   . Anxiety   . Back pain   . Cervical cancer ([place]) 2010  . Chicken pox   . Depression 2009  . Gallbladder problem   . GERD (gastroesophageal reflux disease)   . HTN (hypertension)   . Seasonal allergies   . Uterine cancer ([place]) 2010    Past Surgical History:  Procedure Laterality Date  . ABDOMINAL HYSTERECTOMY  2010   TAH-- cancer  . ANKLE SURGERY Right    Osteomyelitis  . CHOLECYSTECTOMY    . LEG SURGERY     Calf Muscle Surgery    Family History  Problem Relation Age of Onset  . Arthritis Mother   . Stroke Mother   . Hypertension Mother   . Thyroid disease Mother   . Anxiety disorder Mother   . Arthritis Father   . Hyperlipidemia Father   . Heart disease Father   . Heart attack Father 77  . Hypertension Brother   . Drug abuse Son   . Alcohol abuse Paternal Grandmother   . Alcohol abuse Maternal Grandfather   . Heart disease Paternal Grandfather   . Heart attack Paternal Grandfather   . Depression Son     Social History   Socioeconomic History  . Marital status: Married    Spouse name: Elta Guadeloupe   . Number of children: Not on file  . Years of education: Not on file  . Highest education level: Not on file  Occupational History  . Occupation: Insurance claims handler: [employer]    Comment: dental hygiene  Social Needs    . Financial resource strain: Not on file  . Food insecurity:    Worry: Not on file    Inability: Not on file  . Transportation needs:    Medical: Not on file    Non-medical: Not on file  Tobacco Use  . Smoking status: Never Smoker  . Smokeless tobacco: Never Used  Substance and Sexual Activity  . Alcohol use: Yes    Comment: Occ  . Drug use: No  . Sexual activity: Yes    Partners: Male  Lifestyle  . Physical activity:    Days per week: Not on file    Minutes per session: Not on file  . Stress: Not on file  Relationships  . Social connections:    Talks on phone: Not on file    Gets together: Not on file    Attends religious service: Not on file    Active member of club or organization: Not on file    Attends meetings of clubs or organizations: Not on file    Relationship status: Not on file  . Intimate partner violence:    Fear of current or ex partner: Not on file    Emotionally abused:  Not on file    Physically abused: Not on file    Forced sexual activity: Not on file  Other Topics Concern  . Not on file  Social History Narrative   Exercise--- just started back    Outpatient Medications Prior to Visit  Medication Sig Dispense Refill  . famotidine (PEPCID) 20 MG tablet Take 20 mg by mouth 2 (two) times daily as needed for heartburn or indigestion.    Marland Kitchen FLUoxetine (PROZAC) 20 MG capsule Take 1 capsule (20 mg total) by mouth daily. 90 capsule 3  . fluticasone (FLONASE) 50 MCG/ACT nasal spray Place 2 sprays into both nostrils daily. 16 g 3  . lisinopril-hydrochlorothiazide (PRINZIDE,ZESTORETIC) 20-25 MG tablet Take 1 tablet by mouth daily. 90 tablet 1  . omeprazole (PRILOSEC) 20 MG capsule TAKE 1 CAPSULE DAILY 90 capsule 1  . traZODone (DESYREL) 50 MG tablet Take 1-2 tablets (50-100 mg total) by mouth at bedtime as needed. for sleep 60 tablet 3  . cephALEXin (KEFLEX) 500 MG capsule Take 1 capsule (500 mg total) by mouth 2 (two) times daily. 20 capsule 0  . fluconazole  (DIFLUCAN) 150 MG tablet 1 po x1, may repeat in 3 days prn 2 tablet 0  . ibuprofen (ADVIL,MOTRIN) 200 MG tablet Take 200 mg by mouth every 6 (six) hours as needed.    . metFORMIN (GLUCOPHAGE) 500 MG tablet Take 1 tablet (500 mg total) by mouth daily with breakfast. 30 tablet 0  . Vitamin D, Ergocalciferol, (DRISDOL) 50000 units CAPS capsule Take 1 capsule (50,000 Units total) by mouth every 7 (seven) days. 4 capsule 0  . ZYLET 0.5-0.3 % SUSP Place 1 drop into both eyes 3 (three) times daily.     No facility-administered medications prior to visit.     Allergies  Allergen Reactions  . Moxifloxacin Palpitations and Rash  . Augmentin [Amoxicillin-Pot Clavulanate] Other (See Comments)    tachycardia    Review of Systems  Constitutional: Negative for chills, fever and malaise/fatigue.  HENT: Negative for congestion and hearing loss.   Eyes: Negative for discharge.  Respiratory: Negative for cough, sputum production and shortness of breath.   Cardiovascular: Negative for chest pain, palpitations and leg swelling.  Gastrointestinal: Negative for abdominal pain, blood in stool, constipation, diarrhea, heartburn, nausea and vomiting.  Genitourinary: Negative for dysuria, frequency, hematuria and urgency.  Musculoskeletal: Negative for back pain, falls and myalgias.  Skin: Negative for rash.  Neurological: Negative for dizziness, sensory change, loss of consciousness, weakness and headaches.  Endo/Heme/Allergies: Negative for environmental allergies. Does not bruise/bleed easily.  Psychiatric/Behavioral: Negative for depression and suicidal ideas. The patient is not nervous/anxious and does not have insomnia.        Objective:    Physical Exam  Constitutional: She is oriented to person, place, and time. She appears well-developed and well-nourished.  HENT:  Head: Normocephalic and atraumatic.  Eyes: Conjunctivae and EOM are normal.  Neck: Normal range of motion. Neck supple. No JVD  present. Carotid bruit is not present. No thyromegaly present.  Cardiovascular: Normal rate, regular rhythm and normal heart sounds.  No murmur heard. Pulmonary/Chest: Effort normal and breath sounds normal. No respiratory distress. She has no wheezes. She has no rales. She exhibits no tenderness.  Musculoskeletal: She exhibits no edema.  Neurological: She is alert and oriented to person, place, and time.  Skin:     Psychiatric: She has a normal mood and affect.  Nursing note and vitals reviewed.   BP 123/79 (BP Location: Left Arm,  Patient Position: Sitting, Cuff Size: Large)   Pulse 73   Temp 98.7 F (37.1 C) (Oral)   Resp 16   Wt 234 lb (106.1 kg)   SpO2 98%   BMI 42.80 kg/m  Wt Readings from Last 3 Encounters:  08/08/17 234 lb (106.1 kg)  06/04/17 236 lb 3.2 oz (107.1 kg)  05/23/17 230 lb (104.3 kg)   BP Readings from Last 3 Encounters:  08/08/17 123/79  06/04/17 138/88  05/23/17 138/80     Immunization History  Administered Date(s) Administered  . Hepatitis B 08/23/2006, 01/17/2007  . Influenza Whole 01/11/2006, 01/17/2007  . Influenza,inj,Quad PF,6+ Mos 11/20/2013, 01/11/2017  . Influenza-Unspecified 10/28/2014, 11/12/2015  . Td 06/23/2005  . Tdap 06/23/2005, 01/05/2016    Health Maintenance  Topic Date Due  . PNEUMOCOCCAL POLYSACCHARIDE VACCINE (1) 05/10/1972  . FOOT EXAM  05/10/1980  . OPHTHALMOLOGY EXAM  05/10/1980  . HIV Screening  05/10/1985  . INFLUENZA VACCINE  09/26/2017  . PAP SMEAR  01/06/2018  . HEMOGLOBIN A1C  02/07/2018  . MAMMOGRAM  02/15/2018  . TETANUS/TDAP  01/04/2026    Lab Results  Component Value Date   WBC 9.0 06/04/2017   HGB 12.7 06/04/2017   HCT 37.1 06/04/2017   PLT 297.0 06/04/2017   GLUCOSE 115 (H) 08/08/2017   CHOL 171 08/08/2017   TRIG 56.0 08/08/2017   HDL 72.40 08/08/2017   LDLCALC 87 08/08/2017   ALT 56 (H) 08/08/2017   AST 33 08/08/2017   NA 136 08/08/2017   K 4.2 08/08/2017   CL 99 08/08/2017   CREATININE  0.79 08/08/2017   BUN 20 08/08/2017   CO2 28 08/08/2017   TSH 1.710 11/29/2016   HGBA1C 5.9 08/08/2017    Lab Results  Component Value Date   TSH 1.710 11/29/2016   Lab Results  Component Value Date   WBC 9.0 06/04/2017   HGB 12.7 06/04/2017   HCT 37.1 06/04/2017   MCV 88.6 06/04/2017   PLT 297.0 06/04/2017   Lab Results  Component Value Date   NA 136 08/08/2017   K 4.2 08/08/2017   CO2 28 08/08/2017   GLUCOSE 115 (H) 08/08/2017   BUN 20 08/08/2017   CREATININE 0.79 08/08/2017   BILITOT 0.5 08/08/2017   ALKPHOS 74 08/08/2017   AST 33 08/08/2017   ALT 56 (H) 08/08/2017   PROT 6.6 08/08/2017   ALBUMIN 4.3 08/08/2017   CALCIUM 9.0 08/08/2017   GFR 82.82 08/08/2017   Lab Results  Component Value Date   CHOL 171 08/08/2017   Lab Results  Component Value Date   HDL 72.40 08/08/2017   Lab Results  Component Value Date   LDLCALC 87 08/08/2017   Lab Results  Component Value Date   TRIG 56.0 08/08/2017   Lab Results  Component Value Date   CHOLHDL 2 08/08/2017   Lab Results  Component Value Date   HGBA1C 5.9 08/08/2017         Assessment & Plan:   Problem List Items Addressed This Visit      Unprioritized   Cyst of skin   Relevant Orders   Ambulatory referral to General Surgery   Essential hypertension    Well controlled, no changes to meds. Encouraged heart healthy diet such as the DASH diet and exercise as tolerated.       Relevant Orders   Lipid panel (Completed)   Hemoglobin A1c (Completed)   Comprehensive metabolic panel (Completed)   Hyperglycemia   Relevant Orders   Hemoglobin  A1c (Completed)   Comprehensive metabolic panel (Completed)   Seasonal allergies - Primary   Relevant Orders   Ambulatory referral to Allergy      I have discontinued Blayre B. Renda's ibuprofen, ZYLET, Vitamin D (Ergocalciferol), metFORMIN, cephALEXin, and fluconazole. I am also having her maintain her famotidine, traZODone, fluticasone, FLUoxetine,  lisinopril-hydrochlorothiazide, and omeprazole.  No orders of the defined types were placed in this encounter.   CMA served as Education administrator during this visit. History, Physical and Plan performed by medical provider. Documentation and orders reviewed and attested to.  Ann Held, DO

## 2017-08-08 NOTE — Patient Instructions (Signed)
DASH Eating Plan DASH stands for "Dietary Approaches to Stop Hypertension." The DASH eating plan is a healthy eating plan that has been shown to reduce high blood pressure (hypertension). It may also reduce your risk for type 2 diabetes, heart disease, and stroke. The DASH eating plan may also help with weight loss. What are tips for following this plan? General guidelines  Avoid eating more than 2,300 mg (milligrams) of salt (sodium) a day. If you have hypertension, you may need to reduce your sodium intake to 1,500 mg a day.  Limit alcohol intake to no more than 1 drink a day for nonpregnant women and 2 drinks a day for men. One drink equals 12 oz of beer, 5 oz of wine, or 1 oz of hard liquor.  Work with your health care provider to maintain a healthy body weight or to lose weight. Ask what an ideal weight is for you.  Get at least 30 minutes of exercise that causes your heart to beat faster (aerobic exercise) most days of the week. Activities may include walking, swimming, or biking.  Work with your health care provider or diet and nutrition specialist (dietitian) to adjust your eating plan to your individual calorie needs. Reading food labels  Check food labels for the amount of sodium per serving. Choose foods with less than 5 percent of the Daily Value of sodium. Generally, foods with less than 300 mg of sodium per serving fit into this eating plan.  To find whole grains, look for the word "whole" as the first word in the ingredient list. Shopping  Buy products labeled as "low-sodium" or "no salt added."  Buy fresh foods. Avoid canned foods and premade or frozen meals. Cooking  Avoid adding salt when cooking. Use salt-free seasonings or herbs instead of table salt or sea salt. Check with your health care provider or pharmacist before using salt substitutes.  Do not fry foods. Cook foods using healthy methods such as baking, boiling, grilling, and broiling instead.  Cook with  heart-healthy oils, such as olive, canola, soybean, or sunflower oil. Meal planning   Eat a balanced diet that includes: ? 5 or more servings of fruits and vegetables each day. At each meal, try to fill half of your plate with fruits and vegetables. ? Up to 6-8 servings of whole grains each day. ? Less than 6 oz of lean meat, poultry, or fish each day. A 3-oz serving of meat is about the same size as a deck of cards. One egg equals 1 oz. ? 2 servings of low-fat dairy each day. ? A serving of nuts, seeds, or beans 5 times each week. ? Heart-healthy fats. Healthy fats called Omega-3 fatty acids are found in foods such as flaxseeds and coldwater fish, like sardines, salmon, and mackerel.  Limit how much you eat of the following: ? Canned or prepackaged foods. ? Food that is high in trans fat, such as fried foods. ? Food that is high in saturated fat, such as fatty meat. ? Sweets, desserts, sugary drinks, and other foods with added sugar. ? Full-fat dairy products.  Do not salt foods before eating.  Try to eat at least 2 vegetarian meals each week.  Eat more home-cooked food and less restaurant, buffet, and fast food.  When eating at a restaurant, ask that your food be prepared with less salt or no salt, if possible. What foods are recommended? The items listed may not be a complete list. Talk with your dietitian about what   dietary choices are best for you. Grains Whole-grain or whole-wheat bread. Whole-grain or whole-wheat pasta. Brown rice. Oatmeal. Quinoa. Bulgur. Whole-grain and low-sodium cereals. Pita bread. Low-fat, low-sodium crackers. Whole-wheat flour tortillas. Vegetables Fresh or frozen vegetables (raw, steamed, roasted, or grilled). Low-sodium or reduced-sodium tomato and vegetable juice. Low-sodium or reduced-sodium tomato sauce and tomato paste. Low-sodium or reduced-sodium canned vegetables. Fruits All fresh, dried, or frozen fruit. Canned fruit in natural juice (without  added sugar). Meat and other protein foods Skinless chicken or turkey. Ground chicken or turkey. Pork with fat trimmed off. Fish and seafood. Egg whites. Dried beans, peas, or lentils. Unsalted nuts, nut butters, and seeds. Unsalted canned beans. Lean cuts of beef with fat trimmed off. Low-sodium, lean deli meat. Dairy Low-fat (1%) or fat-free (skim) milk. Fat-free, low-fat, or reduced-fat cheeses. Nonfat, low-sodium ricotta or cottage cheese. Low-fat or nonfat yogurt. Low-fat, low-sodium cheese. Fats and oils Soft margarine without trans fats. Vegetable oil. Low-fat, reduced-fat, or light mayonnaise and salad dressings (reduced-sodium). Canola, safflower, olive, soybean, and sunflower oils. Avocado. Seasoning and other foods Herbs. Spices. Seasoning mixes without salt. Unsalted popcorn and pretzels. Fat-free sweets. What foods are not recommended? The items listed may not be a complete list. Talk with your dietitian about what dietary choices are best for you. Grains Baked goods made with fat, such as croissants, muffins, or some breads. Dry pasta or rice meal packs. Vegetables Creamed or fried vegetables. Vegetables in a cheese sauce. Regular canned vegetables (not low-sodium or reduced-sodium). Regular canned tomato sauce and paste (not low-sodium or reduced-sodium). Regular tomato and vegetable juice (not low-sodium or reduced-sodium). Pickles. Olives. Fruits Canned fruit in a light or heavy syrup. Fried fruit. Fruit in cream or butter sauce. Meat and other protein foods Fatty cuts of meat. Ribs. Fried meat. Bacon. Sausage. Bologna and other processed lunch meats. Salami. Fatback. Hotdogs. Bratwurst. Salted nuts and seeds. Canned beans with added salt. Canned or smoked fish. Whole eggs or egg yolks. Chicken or turkey with skin. Dairy Whole or 2% milk, cream, and half-and-half. Whole or full-fat cream cheese. Whole-fat or sweetened yogurt. Full-fat cheese. Nondairy creamers. Whipped toppings.  Processed cheese and cheese spreads. Fats and oils Butter. Stick margarine. Lard. Shortening. Ghee. Bacon fat. Tropical oils, such as coconut, palm kernel, or palm oil. Seasoning and other foods Salted popcorn and pretzels. Onion salt, garlic salt, seasoned salt, table salt, and sea salt. Worcestershire sauce. Tartar sauce. Barbecue sauce. Teriyaki sauce. Soy sauce, including reduced-sodium. Steak sauce. Canned and packaged gravies. Fish sauce. Oyster sauce. Cocktail sauce. Horseradish that you find on the shelf. Ketchup. Mustard. Meat flavorings and tenderizers. Bouillon cubes. Hot sauce and Tabasco sauce. Premade or packaged marinades. Premade or packaged taco seasonings. Relishes. Regular salad dressings. Where to find more information:  National Heart, Lung, and Blood Institute: www.nhlbi.nih.gov  American Heart Association: www.heart.org Summary  The DASH eating plan is a healthy eating plan that has been shown to reduce high blood pressure (hypertension). It may also reduce your risk for type 2 diabetes, heart disease, and stroke.  With the DASH eating plan, you should limit salt (sodium) intake to 2,300 mg a day. If you have hypertension, you may need to reduce your sodium intake to 1,500 mg a day.  When on the DASH eating plan, aim to eat more fresh fruits and vegetables, whole grains, lean proteins, low-fat dairy, and heart-healthy fats.  Work with your health care provider or diet and nutrition specialist (dietitian) to adjust your eating plan to your individual   calorie needs. This information is not intended to replace advice given to you by your health care provider. Make sure you discuss any questions you have with your health care provider. Document Released: 02/01/2011 Document Revised: 02/06/2016 Document Reviewed: 02/06/2016 Else[employer] Interactive Patient Education  2018 [employer]  

## 2017-08-10 DIAGNOSIS — L729 Follicular cyst of the skin and subcutaneous tissue, unspecified: Secondary | ICD-10-CM | POA: Insufficient documentation

## 2017-08-10 DIAGNOSIS — R739 Hyperglycemia, unspecified: Secondary | ICD-10-CM | POA: Insufficient documentation

## 2017-08-10 DIAGNOSIS — J302 Other seasonal allergic rhinitis: Secondary | ICD-10-CM | POA: Insufficient documentation

## 2017-08-10 NOTE — Assessment & Plan Note (Signed)
Well controlled, no changes to meds. Encouraged heart healthy diet such as the DASH diet and exercise as tolerated.  °

## 2017-08-13 ENCOUNTER — Encounter: Payer: Self-pay | Admitting: Family Medicine

## 2017-09-17 ENCOUNTER — Ambulatory Visit: Payer: 59 | Admitting: Family Medicine

## 2017-09-17 ENCOUNTER — Other Ambulatory Visit (HOSPITAL_COMMUNITY)
Admission: RE | Admit: 2017-09-17 | Discharge: 2017-09-17 | Disposition: A | Discharge status: Home / Self Care | Payer: 59 | Source: Ambulatory Visit | Attending: Family Medicine | Admitting: Family Medicine

## 2017-09-17 VITALS — BP 139/55 | HR 78 | Temp 98.3°F | Resp 16 | Ht 62.0 in | Wt 237.2 lb

## 2017-09-17 DIAGNOSIS — N760 Acute vaginitis: Secondary | ICD-10-CM

## 2017-09-17 DIAGNOSIS — L237 Allergic contact dermatitis due to plants, except food: Secondary | ICD-10-CM | POA: Diagnosis not present

## 2017-09-17 DIAGNOSIS — I10 Essential (primary) hypertension: Secondary | ICD-10-CM | POA: Diagnosis not present

## 2017-09-17 MED ORDER — PREDNISONE 10 MG PO TABS: ORAL_TABLET | ORAL | 0 refills | Status: DC

## 2017-09-17 MED ORDER — METHYLPREDNISOLONE ACETATE 80 MG/ML IJ SUSP
80.0000 mg | Freq: Once | INTRAMUSCULAR | Status: AC
  Administered 2017-09-17: 80 mg via INTRAMUSCULAR

## 2017-09-17 MED ORDER — LISINOPRIL-HYDROCHLOROTHIAZIDE 20-25 MG PO TABS: 1.0000 | ORAL_TABLET | Freq: Every day | ORAL | 0 refills | Status: DC

## 2017-09-17 NOTE — Progress Notes (Signed)
Patient ID: Kristi Ramos, female    DOB: [birth year]  Age: 47 y.o. MRN: [id number]    Subjective:  Subjective  HPI Kristi Ramos presents for poison ivy/ sumac on R hip  Review of Systems  Constitutional: Negative for fever.  HENT: Negative for congestion.   Respiratory: Negative for shortness of breath.   Cardiovascular: Negative for chest pain, palpitations and leg swelling.  Gastrointestinal: Negative for abdominal pain, blood in stool and nausea.  Genitourinary: Negative for dysuria and frequency.  Skin: Negative for rash.  Allergic/Immunologic: Negative for environmental allergies.  Neurological: Negative for dizziness and headaches.  Psychiatric/Behavioral: The patient is not nervous/anxious.     History Past Medical History:  Diagnosis Date  . ADD (attention deficit disorder)   . Anxiety   . Back pain   . Cervical cancer ([place]) 2010  . Chicken pox   . Depression 2009  . Gallbladder problem   . GERD (gastroesophageal reflux disease)   . HTN (hypertension)   . Seasonal allergies   . Uterine cancer ([place]) 2010    She has a past surgical history that includes Cholecystectomy; Ankle surgery (Right); Leg Surgery; and Abdominal hysterectomy (2010).   Her family history includes Alcohol abuse in her maternal grandfather and paternal grandmother; Anxiety disorder in her mother; Arthritis in her father and mother; Depression in her son; Drug abuse in her son; Heart attack in her paternal grandfather; Heart attack (age of onset: 25) in her father; Heart disease in her father and paternal grandfather; Hyperlipidemia in her father; Hypertension in her brother and mother; Stroke in her mother; Thyroid disease in her mother.She reports that she has never smoked. She has never used smokeless tobacco. She reports that she drinks alcohol. She reports that she does not use drugs.  Current Outpatient Medications on File Prior to Visit  Medication Sig Dispense Refill  .  famotidine (PEPCID) 20 MG tablet Take 20 mg by mouth 2 (two) times daily as needed for heartburn or indigestion.    Marland Kitchen FLUoxetine (PROZAC) 20 MG capsule Take 1 capsule (20 mg total) by mouth daily. 90 capsule 3  . fluticasone (FLONASE) 50 MCG/ACT nasal spray Place 2 sprays into both nostrils daily. 16 g 3  . omeprazole (PRILOSEC) 20 MG capsule TAKE 1 CAPSULE DAILY 90 capsule 1  . traZODone (DESYREL) 50 MG tablet Take 1-2 tablets (50-100 mg total) by mouth at bedtime as needed. for sleep 60 tablet 3   No current facility-administered medications on file prior to visit.      Objective:  Objective  Physical Exam  Constitutional: She is oriented to person, place, and time. She appears well-developed and well-nourished.  HENT:  Head: Normocephalic and atraumatic.  Eyes: Conjunctivae and EOM are normal.  Neck: Normal range of motion. Neck supple. No JVD present. Carotid bruit is not present. No thyromegaly present.  Cardiovascular: Normal rate, regular rhythm and normal heart sounds.  No murmur heard. Pulmonary/Chest: Effort normal and breath sounds normal. No respiratory distress. She has no wheezes. She has no rales. She exhibits no tenderness.  Musculoskeletal: She exhibits no edema.  Neurological: She is alert and oriented to person, place, and time.  Skin: There is erythema.     Psychiatric: She has a normal mood and affect.  Nursing note and vitals reviewed.  BP (!) 139/55 (BP Location: Right Arm, Cuff Size: Normal)   Pulse 78   Temp 98.3 F (36.8 C) (Oral)   Resp 16   Ht 5\' 2"  (1.575  m)   Wt 237 lb 3.2 oz (107.6 kg)   SpO2 100%   BMI 43.38 kg/m  Wt Readings from Last 3 Encounters:  09/17/17 237 lb 3.2 oz (107.6 kg)  08/08/17 234 lb (106.1 kg)  06/04/17 236 lb 3.2 oz (107.1 kg)     Lab Results  Component Value Date   WBC 9.0 06/04/2017   HGB 12.7 06/04/2017   HCT 37.1 06/04/2017   PLT 297.0 06/04/2017   GLUCOSE 115 (H) 08/08/2017   CHOL 171 08/08/2017   TRIG 56.0  08/08/2017   HDL 72.40 08/08/2017   LDLCALC 87 08/08/2017   ALT 56 (H) 08/08/2017   AST 33 08/08/2017   NA 136 08/08/2017   K 4.2 08/08/2017   CL 99 08/08/2017   CREATININE 0.79 08/08/2017   BUN 20 08/08/2017   CO2 28 08/08/2017   TSH 1.710 11/29/2016   HGBA1C 5.9 08/08/2017    Mm Diag Breast Tomo Bilateral  Result Date: 02/15/2017 CLINICAL DATA:  47 year old female presenting for follow-up of a probably benign asymmetry in the left breast. EXAM: 2D DIGITAL DIAGNOSTIC BILATERAL MAMMOGRAM WITH CAD AND ADJUNCT TOMO COMPARISON:  Previous exam(s). ACR Breast Density Category c: The breast tissue is heterogeneously dense, which may obscure small masses. FINDINGS: The asymmetry of concern in the lower-inner quadrant of the left breast corresponds with a tortuous blood vessel. There are no suspicious masses or areas of distortion in this region. No suspicious calcifications, masses or areas of distortion are seen in the bilateral breasts. Mammographic images were processed with CAD. IMPRESSION: 1. The asymmetry in the lower-inner left breast corresponds with a tortuous blood vessel. 2.  No mammographic evidence of malignancy in the bilateral breasts. RECOMMENDATION: Screening mammogram in one year.(Code:SM-B-01Y) I have discussed the findings and recommendations with the patient. Results were also provided in writing at the conclusion of the visit. If applicable, a reminder letter will be sent to the patient regarding the next appointment. BI-RADS CATEGORY  1: Negative. Electronically Signed   By: Ammie Ferrier M.D.   On: 02/15/2017 08:22     Assessment & Plan:  Plan  I am having Kristi Ramos start on predniSONE. I am also having her maintain her famotidine, traZODone, fluticasone, FLUoxetine, omeprazole, and lisinopril-hydrochlorothiazide. We administered methylPREDNISolone acetate.  Meds ordered this encounter  Medications  . lisinopril-hydrochlorothiazide (PRINZIDE,ZESTORETIC) 20-25 MG  tablet    Sig: Take 1 tablet by mouth daily.    Dispense:  30 tablet    Refill:  0    Deactivate previous lower dosage  . predniSONE (DELTASONE) 10 MG tablet    Sig: TAKE 3 TABLETS PO QD FOR 3 DAYS THEN TAKE 2 TABLETS PO QD FOR 3 DAYS THEN TAKE 1 TABLET PO QD FOR 3 DAYS THEN TAKE 1/2 TAB PO QD FOR 3 DAYS    Dispense:  20 tablet    Refill:  0  . methylPREDNISolone acetate (DEPO-MEDROL) injection 80 mg    Problem List Items Addressed This Visit      Unprioritized   HTN (hypertension)   Relevant Medications   lisinopril-hydrochlorothiazide (PRINZIDE,ZESTORETIC) 20-25 MG tablet    Other Visit Diagnoses    Acute vaginitis    -  Primary   Relevant Orders   Urine cytology ancillary only   Allergic contact dermatitis due to plants, except food       Relevant Medications   predniSONE (DELTASONE) 10 MG tablet   methylPREDNISolone acetate (DEPO-MEDROL) injection 80 mg (Completed)  Use otc benadryl prn itching         Follow-up: Return if symptoms worsen or fail to improve.  Ann Held, DO

## 2017-09-17 NOTE — Patient Instructions (Signed)

## 2017-09-18 ENCOUNTER — Encounter: Payer: Self-pay | Admitting: Family Medicine

## 2017-09-19 ENCOUNTER — Other Ambulatory Visit: Payer: Self-pay | Admitting: Family Medicine

## 2017-09-19 DIAGNOSIS — N760 Acute vaginitis: Secondary | ICD-10-CM

## 2017-09-19 MED ORDER — FLUCONAZOLE 150 MG PO TABS: 150.0000 mg | ORAL_TABLET | Freq: Once | ORAL | 0 refills | Status: AC

## 2017-09-19 NOTE — Telephone Encounter (Signed)
Labs not back yet

## 2017-09-20 ENCOUNTER — Other Ambulatory Visit: Payer: Self-pay | Admitting: Family Medicine

## 2017-09-20 DIAGNOSIS — B9689 Other specified bacterial agents as the cause of diseases classified elsewhere: Secondary | ICD-10-CM

## 2017-09-20 DIAGNOSIS — N760 Acute vaginitis: Principal | ICD-10-CM

## 2017-09-20 LAB — URINE CYTOLOGY ANCILLARY ONLY: Candida vaginitis: NEGATIVE

## 2017-09-20 MED ORDER — CLINDAMYCIN PHOSPHATE 100 MG VA SUPP: 100.0000 mg | Freq: Every day | VAGINAL | 0 refills | Status: DC

## 2017-09-20 MED ORDER — METRONIDAZOLE 500 MG PO TABS
500.0000 mg | ORAL_TABLET | Freq: Two times a day (BID) | ORAL | 0 refills | Status: DC
Start: 2017-09-20 — End: 2018-03-06

## 2017-09-20 NOTE — Addendum Note (Signed)
Addended by: Magdalene Molly A on: 09/20/2017 02:15 PM   Modules accepted: Orders

## 2017-10-08 ENCOUNTER — Telehealth: Payer: Self-pay | Admitting: *Deleted

## 2017-10-08 DIAGNOSIS — Z0184 Encounter for antibody response examination: Secondary | ICD-10-CM

## 2017-10-08 NOTE — Telephone Encounter (Signed)
Immunization record printed and placed up front.  Patient also wanted a hep b titer.  Order placed. She will be in next week Thursday or Friday

## 2017-10-08 NOTE — Telephone Encounter (Signed)
jCopied from [place] [id number]. Topic: Inquiry >> Oct 08, 2017  1:58 PM Berneta Levins wrote: Reason for CRM:   Pt calling.  She needs her updated shot records for work/[employer].   Pt can be reached at [phone]

## 2017-10-18 ENCOUNTER — Other Ambulatory Visit: Payer: 59

## 2017-10-24 ENCOUNTER — Other Ambulatory Visit (INDEPENDENT_AMBULATORY_CARE_PROVIDER_SITE_OTHER): Payer: 59

## 2017-10-24 DIAGNOSIS — H00014 Hordeolum externum left upper eyelid: Secondary | ICD-10-CM | POA: Diagnosis not present

## 2017-10-24 DIAGNOSIS — Z0184 Encounter for antibody response examination: Secondary | ICD-10-CM

## 2017-10-25 LAB — HEPATITIS B SURFACE ANTIBODY, QUANTITATIVE: Hepatitis B-Post: <5 m[IU]/mL — ABNORMAL LOW (ref >=10)

## 2017-11-04 ENCOUNTER — Encounter: Payer: Self-pay | Admitting: Family Medicine

## 2017-11-05 NOTE — Telephone Encounter (Signed)
Kristi Ramos -- can you do flu shot as pt is requesting?

## 2017-11-07 ENCOUNTER — Other Ambulatory Visit: Payer: Self-pay | Admitting: Family Medicine

## 2017-11-07 ENCOUNTER — Ambulatory Visit (INDEPENDENT_AMBULATORY_CARE_PROVIDER_SITE_OTHER): Payer: 59

## 2017-11-07 DIAGNOSIS — G4700 Insomnia, unspecified: Secondary | ICD-10-CM

## 2017-11-07 DIAGNOSIS — Z23 Encounter for immunization: Secondary | ICD-10-CM

## 2017-11-11 ENCOUNTER — Other Ambulatory Visit: Payer: Self-pay | Admitting: Family Medicine

## 2017-11-11 DIAGNOSIS — I10 Essential (primary) hypertension: Secondary | ICD-10-CM

## 2017-11-25 ENCOUNTER — Encounter: Payer: Self-pay | Admitting: Family Medicine

## 2017-11-25 ENCOUNTER — Ambulatory Visit: Payer: 59 | Admitting: Family Medicine

## 2017-11-25 ENCOUNTER — Telehealth: Payer: Self-pay | Admitting: Family Medicine

## 2017-11-25 ENCOUNTER — Other Ambulatory Visit: Payer: Self-pay | Admitting: Family Medicine

## 2017-11-25 VITALS — BP 100/70 | HR 87 | Temp 98.3°F | Resp 18 | Wt 231.0 lb

## 2017-11-25 DIAGNOSIS — I10 Essential (primary) hypertension: Secondary | ICD-10-CM

## 2017-11-25 DIAGNOSIS — R945 Abnormal results of liver function studies: Secondary | ICD-10-CM

## 2017-11-25 DIAGNOSIS — R7989 Other specified abnormal findings of blood chemistry: Secondary | ICD-10-CM

## 2017-11-25 DIAGNOSIS — R7303 Prediabetes: Secondary | ICD-10-CM | POA: Diagnosis not present

## 2017-11-25 DIAGNOSIS — Z6841 Body Mass Index (BMI) 40.0 and over, adult: Secondary | ICD-10-CM

## 2017-11-25 DIAGNOSIS — R002 Palpitations: Secondary | ICD-10-CM

## 2017-11-25 LAB — COMPREHENSIVE METABOLIC PANEL
ALT: 112 U/L — ABNORMAL HIGH (ref 0–35)
AST: 82 U/L — ABNORMAL HIGH (ref 0–37)
Albumin: 4.4 g/dL (ref 3.5–5.2)
Alkaline Phosphatase: 78 U/L (ref 39–117)
BUN: 17 mg/dL (ref 6–23)
CHLORIDE: 99 meq/L (ref 96–112)
CO2: 26 mEq/L (ref 19–32)
Calcium: 9.3 mg/dL (ref 8.4–10.5)
Creatinine, Ser: 0.6 mg/dL (ref 0.40–1.20)
GFR: 113.63 mL/min (ref >60.00)
Glucose, Bld: 113 mg/dL — ABNORMAL HIGH (ref 70–99)
POTASSIUM: 4 meq/L (ref 3.5–5.1)
Sodium: 135 mEq/L (ref 135–145)
Total Bilirubin: 0.6 mg/dL (ref 0.2–1.2)
Total Protein: 6.8 g/dL (ref 6.0–8.3)

## 2017-11-25 LAB — TSH: TSH: 1.99 u[IU]/mL (ref 0.35–4.50)

## 2017-11-25 MED ORDER — ALPRAZOLAM 0.25 MG PO TABS: 0.1250 mg | ORAL_TABLET | Freq: Every day | ORAL | 0 refills | Status: DC | PRN

## 2017-11-25 NOTE — Patient Instructions (Addendum)
Hypoglycemia Hypoglycemia is when the sugar (glucose) level in the blood is too low. Symptoms of low blood sugar may include:  Feeling: ? Hungry. ? Worried or nervous (anxious). ? Sweaty and clammy. ? Confused. ? Dizzy. ? Sleepy. ? Sick to your stomach (nauseous).  Having: ? A fast heartbeat. ? A headache. ? A change in your vision. ? Jerky movements that you cannot control (seizure). ? Nightmares. ? Tingling or no feeling (numbness) around the mouth, lips, or tongue.  Having trouble with: ? Talking. ? Paying attention (concentrating). ? Moving (coordination). ? Sleeping.  Shaking.  Passing out (fainting).  Getting upset easily (irritability).  Low blood sugar can happen to people who have diabetes and people who do not have diabetes. Low blood sugar can happen quickly, and it can be an emergency. Treating Low Blood Sugar Low blood sugar is often treated by eating or drinking something sugary right away. If you can think clearly and swallow safely, follow the 15:15 rule:  Take 15 grams of a fast-acting carb (carbohydrate). Some fast-acting carbs are: ? 1 tube of glucose gel. ? 3 sugar tablets (glucose pills). ? 6-8 pieces of hard candy. ? 4 oz (120 mL) of fruit juice. ? 4 oz (120 mL) of regular (not diet) soda.  Check your blood sugar 15 minutes after you take the carb.  If your blood sugar is still at or below 70 mg/dL (3.9 mmol/L), take 15 grams of a carb again.  If your blood sugar does not go above 70 mg/dL (3.9 mmol/L) after 3 tries, get help right away.  After your blood sugar goes back to normal, eat a meal or a snack within 1 hour.  Treating Very Low Blood Sugar If your blood sugar is at or below 54 mg/dL (3 mmol/L), you have very low blood sugar (severe hypoglycemia). This is an emergency. Do not wait to see if the symptoms will go away. Get medical help right away. Call your local emergency services (911 in the [place] Do not drive yourself to the  hospital. If you have very low blood sugar and you cannot eat or drink, you may need a glucagon shot (injection). A family member or friend should learn how to check your blood sugar and how to give you a glucagon shot. Ask your doctor if you need to have a glucagon shot kit at home. Follow these instructions at home: General instructions  Avoid any diets that cause you to not eat enough food. Talk with your doctor before you start any new diet.  Take over-the-counter and prescription medicines only as told by your doctor.  Limit alcohol to no more than 1 drink per day for nonpregnant women and 2 drinks per day for men. One drink equals 12 oz of beer, 5 oz of wine, or 1 oz of hard liquor.  Keep all follow-up visits as told by your doctor. This is important. If You Have Diabetes:   Make sure you know the symptoms of low blood sugar.  Always keep a source of sugar with you, such as: ? Sugar. ? Sugar tablets. ? Glucose gel. ? Fruit juice. ? Regular soda (not diet soda). ? Milk. ? Hard candy. ? Honey.  Take your medicines as told.  Follow your exercise and meal plan. ? Eat on time. Do not skip meals. ? Follow your sick day plan when you cannot eat or drink normally. Make this plan ahead of time with your doctor.  Check your blood sugar as often   as told by your doctor. Always check before and after exercise.  Share your diabetes care plan with: ? Your work or school. ? People you live with.  Check your pee (urine) for ketones: ? When you are sick. ? As told by your doctor.  Carry a card or wear jewelry that says you have diabetes. If You Have Low Blood Sugar From Other Causes:   Check your blood sugar as often as told by your doctor.  Follow instructions from your doctor about what you cannot eat or drink. Contact a doctor if:  You have trouble keeping your blood sugar in your target range.  You have low blood sugar often. Get help right away if:  You still have  symptoms after you eat or drink something sugary.  Your blood sugar is at or below 54 mg/dL (3 mmol/L).  You have jerky movements that you cannot control.  You pass out. These symptoms may be an emergency. Do not wait to see if the symptoms will go away. Get medical help right away. Call your local emergency services (911 in the [place] Do not drive yourself to the hospital. This information is not intended to replace advice given to you by your health care provider. Make sure you discuss any questions you have with your health care provider. Document Released: 05/09/2009 Document Revised: 07/21/2015 Document Reviewed: 03/18/2015 Elsevier Interactive Patient Education  2018 Elsevier Inc.  

## 2017-11-25 NOTE — Assessment & Plan Note (Signed)
Well controlled, no changes to meds. Encouraged heart healthy diet such as the DASH diet and exercise as tolerated.  °

## 2017-11-25 NOTE — Telephone Encounter (Signed)
Thanks have sent in new rx with #5 tabs

## 2017-11-25 NOTE — Progress Notes (Signed)
Subjective:    Patient ID: Kristi Ramos, female    DOB: 1970/10/29, 47 y.o.   MRN: [id number]  No chief complaint on file.   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 She started with weight watchers last week and she acknowledges that she only ate one egg this am before going to work. Denies CP//SOB/HA/congestion/fevers/GI or GU c/o. Taking meds as prescribed  Past Medical History:  Diagnosis Date  . ADD (attention deficit disorder)   . Anxiety   . Back pain   . Cervical cancer ([place]) 2010  . Chicken pox   . Depression 2009  . Gallbladder problem   . GERD (gastroesophageal reflux disease)   . HTN (hypertension)   . Seasonal allergies   . Uterine cancer ([place]) 2010    Past Surgical History:  Procedure Laterality Date  . ABDOMINAL HYSTERECTOMY  2010   TAH-- cancer  . ANKLE SURGERY Right    Osteomyelitis  . CHOLECYSTECTOMY    . LEG SURGERY     Calf Muscle Surgery    Family History  Problem Relation Age of Onset  . Arthritis Mother   . Stroke Mother   . Hypertension Mother   . Thyroid disease Mother   . Anxiety disorder Mother   . Arthritis Father   . Hyperlipidemia Father   . Heart disease Father   . Heart attack Father 32  . Hypertension Brother   . Drug abuse Son   . Alcohol abuse Paternal Grandmother   . Alcohol abuse Maternal Grandfather   . Heart disease Paternal Grandfather   . Heart attack Paternal Grandfather   . Depression Son     Social History   Socioeconomic History  . Marital status: Married    Spouse name: Elta Guadeloupe   . Number of children: Not on file  . Years of education: Not on file  . Highest education level: Not on file  Occupational History  . Occupation: Insurance claims handler: [employer]    Comment: dental hygiene  Social Needs  . Financial resource strain: Not on file  . Food  insecurity:    Worry: Not on file    Inability: Not on file  . Transportation needs:    Medical: Not on file    Non-medical: Not on file  Tobacco Use  . Smoking status: Never Smoker  . Smokeless tobacco: Never Used  Substance and Sexual Activity  . Alcohol use: Yes    Comment: Occ  . Drug use: No  . Sexual activity: Yes    Partners: Male  Lifestyle  . Physical activity:    Days per week: Not on file    Minutes per session: Not on file  . Stress: Not on file  Relationships  . Social connections:    Talks on phone: Not on file    Gets together: Not on file    Attends religious service: Not on file    Active member of club or organization: Not on file    Attends meetings of clubs or organizations: Not on file    Relationship status: Not on file  . Intimate partner violence:    Fear of current or ex partner: Not on file    Emotionally abused: Not on file    Physically abused: Not on file    Forced sexual activity: Not on file  Other Topics Concern  . Not on file  Social History Narrative   Exercise--- just started back    Outpatient Medications Prior to Visit  Medication Sig Dispense Refill  . clindamycin (CLEOCIN) 100 MG vaginal suppository Place 1 suppository (100 mg total) vaginally at bedtime. For 3 days 3 suppository 0  . famotidine (PEPCID) 20 MG tablet Take 20 mg by mouth 2 (two) times daily as needed for heartburn or indigestion.    Marland Kitchen FLUoxetine (PROZAC) 20 MG capsule Take 1 capsule (20 mg total) by mouth daily. 90 capsule 3  . fluticasone (FLONASE) 50 MCG/ACT nasal spray Place 2 sprays into both nostrils daily. 16 g 3  . lisinopril-hydrochlorothiazide (PRINZIDE,ZESTORETIC) 20-25 MG tablet Take 1 tablet by mouth daily. 30 tablet 0  . lisinopril-hydrochlorothiazide (PRINZIDE,ZESTORETIC) 20-25 MG tablet TAKE 1 TABLET DAILY (DOSE INCREASE) 90 tablet 3  . metroNIDAZOLE (FLAGYL) 500 MG tablet Take 1 tablet (500 mg total) by mouth 2 (two) times daily. 14 tablet 0  .  omeprazole (PRILOSEC) 20 MG capsule TAKE 1 CAPSULE DAILY 90 capsule 1  . predniSONE (DELTASONE) 10 MG tablet TAKE 3 TABLETS PO QD FOR 3 DAYS THEN TAKE 2 TABLETS PO QD FOR 3 DAYS THEN TAKE 1 TABLET PO QD FOR 3 DAYS THEN TAKE 1/2 TAB PO QD FOR 3 DAYS 20 tablet 0  . traZODone (DESYREL) 50 MG tablet TAKE 1 TO 2 TABLETS BY MOUTH AT BEDTIME AS NEEDED FOR SLEEP 60 tablet 3   No facility-administered medications prior to visit.     Allergies  Allergen Reactions  . Moxifloxacin Palpitations and Rash  . Augmentin [Amoxicillin-Pot Clavulanate] Other (See Comments)    tachycardia    Review of Systems  Constitutional: Negative for fever and malaise/fatigue.  HENT: Negative for congestion.   Eyes: Negative for blurred vision.  Respiratory: Negative for shortness of breath.   Cardiovascular: Positive for palpitations. Negative for chest pain and leg swelling.  Gastrointestinal: Negative for abdominal pain, blood in stool and nausea.  Genitourinary: Negative for dysuria and frequency.  Musculoskeletal: Negative for falls.  Skin: Negative for rash.  Neurological: Positive for tremors. Negative for dizziness, loss of consciousness and headaches.  Endo/Heme/Allergies: Negative for environmental allergies.  Psychiatric/Behavioral: Negative for depression. The patient is nervous/anxious.        Objective:    Physical Exam  Constitutional: She is oriented to person, place, and time. She appears well-developed and well-nourished. No distress.  HENT:  Head: Normocephalic and atraumatic.  Nose: Nose normal.  Eyes: Right eye exhibits no discharge. Left eye exhibits no discharge.  Neck: Normal range of motion. Neck supple.  Cardiovascular: Normal rate and regular rhythm.  No murmur heard. Pulmonary/Chest: Effort normal and breath sounds normal.  Abdominal: Soft. Bowel sounds are normal. There is no tenderness.  Musculoskeletal: She exhibits no edema.  Neurological: She is alert and oriented to  person, place, and time.  Skin: Skin is warm and dry.  Psychiatric: She has a normal mood and affect.  Nursing note and vitals reviewed.   BP 100/70 (BP Location: Left Arm, Patient Position: Sitting, Cuff Size: Normal)   Pulse 87   Temp 98.3 F (36.8 C) (Oral)   Resp 18   Wt 231 lb (104.8 kg)   SpO2 97%   BMI 42.25 kg/m  Wt Readings from Last 3 Encounters:  11/25/17 231 lb (104.8 kg)  09/17/17 237 lb 3.2 oz (107.6 kg)  08/08/17 234 lb (106.1 kg)     Lab Results  Component Value  Date   WBC 9.0 06/04/2017   HGB 12.7 06/04/2017   HCT 37.1 06/04/2017   PLT 297.0 06/04/2017   GLUCOSE 113 (H) 11/25/2017   CHOL 171 08/08/2017   TRIG 56.0 08/08/2017   HDL 72.40 08/08/2017   LDLCALC 87 08/08/2017   ALT 112 (H) 11/25/2017   AST 82 (H) 11/25/2017   NA 135 11/25/2017   K 4.0 11/25/2017   CL 99 11/25/2017   CREATININE 0.60 11/25/2017   BUN 17 11/25/2017   CO2 26 11/25/2017   TSH 1.99 11/25/2017   HGBA1C 5.9 08/08/2017    Lab Results  Component Value Date   TSH 1.99 11/25/2017   Lab Results  Component Value Date   WBC 9.0 06/04/2017   HGB 12.7 06/04/2017   HCT 37.1 06/04/2017   MCV 88.6 06/04/2017   PLT 297.0 06/04/2017   Lab Results  Component Value Date   NA 135 11/25/2017   K 4.0 11/25/2017   CO2 26 11/25/2017   GLUCOSE 113 (H) 11/25/2017   BUN 17 11/25/2017   CREATININE 0.60 11/25/2017   BILITOT 0.6 11/25/2017   ALKPHOS 78 11/25/2017   AST 82 (H) 11/25/2017   ALT 112 (H) 11/25/2017   PROT 6.8 11/25/2017   ALBUMIN 4.4 11/25/2017   CALCIUM 9.3 11/25/2017   GFR 113.63 11/25/2017   Lab Results  Component Value Date   CHOL 171 08/08/2017   Lab Results  Component Value Date   HDL 72.40 08/08/2017   Lab Results  Component Value Date   LDLCALC 87 08/08/2017   Lab Results  Component Value Date   TRIG 56.0 08/08/2017   Lab Results  Component Value Date   CHOLHDL 2 08/08/2017   Lab Results  Component Value Date   HGBA1C 5.9 08/08/2017        Assessment & Plan:   Problem List Items Addressed This Visit    HTN (hypertension)    Well controlled, no changes to meds. Encouraged heart healthy diet such as the DASH diet and exercise as tolerated.       Palpitations - Primary   Relevant Orders   TSH (Completed)   Comprehensive metabolic panel (Completed)   Elevated LFTs    Elevated today. Minimize simple carbs and fast food, increase exercise and continue weight loss efforts. Repeat cmp in 2-4 weeks       Prediabetes    She is describing episodes of low blood sugar as she has changed he eating habits this week. Encouraged adequate hydration and a source of protein every 4 hours or so report if symptoms persist.      Class 3 severe obesity with serious comorbidity and body mass index (BMI) of 40.0 to 44.9 in adult [facility])    Stated with weight watchers last week and today had an episode of feeling shaky, anxious with racing heart beat so she is here today for further consideration and eat protein every 4 hours         I am having Domini B. Warman maintain her famotidine, fluticasone, FLUoxetine, omeprazole, lisinopril-hydrochlorothiazide, predniSONE, metroNIDAZOLE, clindamycin, traZODone, and lisinopril-hydrochlorothiazide.  Meds ordered this encounter  Medications  . DISCONTD: ALPRAZolam (XANAX) 0.25 MG tablet    Sig: Take 0.5-10 tablets (0.125-2.5 mg total) by mouth daily as needed for anxiety.    Dispense:  205 tablet    Refill:  0     Penni Homans, MD

## 2017-11-25 NOTE — Assessment & Plan Note (Signed)
Elevated today. Minimize simple carbs and fast food, increase exercise and continue weight loss efforts. Repeat cmp in 2-4 weeks

## 2017-11-25 NOTE — Telephone Encounter (Signed)
Copied from [place] [id number]. Topic: Quick Communication - See Telephone Encounter >> Nov 25, 2017  2:56 PM Kristi Ramos wrote: CRM for notification. See Telephone encounter for: 11/25/17. [facility] called - they have questions about  ALPRAZolam (XANAX) 0.25 MG tablet.  The qty says 205 and the direction say 0.5 - 10 tabs.  They want to know what the correction should be.  Please call [phone]

## 2017-11-25 NOTE — Assessment & Plan Note (Signed)
She is describing episodes of low blood sugar as she has changed he eating habits this week. Encouraged adequate hydration and a source of protein every 4 hours or so report if symptoms persist.

## 2017-11-25 NOTE — Assessment & Plan Note (Signed)
Stated with weight watchers last week and today had an episode of feeling shaky, anxious with racing heart beat so she is here today for further consideration and eat protein every 4 hours

## 2017-11-26 NOTE — Telephone Encounter (Signed)
[facility] notified of correction.  0.5-1 tabs and #5.

## 2017-11-26 NOTE — Telephone Encounter (Signed)
Mary from [facility] [facility] calling back about correct directions on the Xanax call back [phone]

## 2017-11-28 NOTE — Addendum Note (Signed)
Addended byDamita Dunnings D on: 11/28/2017 02:08 PM   Modules accepted: Orders

## 2017-12-22 ENCOUNTER — Other Ambulatory Visit: Payer: Self-pay | Admitting: Family Medicine

## 2017-12-22 DIAGNOSIS — K219 Gastro-esophageal reflux disease without esophagitis: Secondary | ICD-10-CM

## 2018-03-06 ENCOUNTER — Ambulatory Visit: Payer: 59 | Admitting: Family Medicine

## 2018-03-06 ENCOUNTER — Encounter: Payer: Self-pay | Admitting: Family Medicine

## 2018-03-06 VITALS — BP 122/80 | HR 76 | Temp 97.6°F | Resp 16 | Ht 62.0 in | Wt 232.4 lb

## 2018-03-06 DIAGNOSIS — R6883 Chills (without fever): Secondary | ICD-10-CM | POA: Diagnosis not present

## 2018-03-06 DIAGNOSIS — G4700 Insomnia, unspecified: Secondary | ICD-10-CM | POA: Diagnosis not present

## 2018-03-06 DIAGNOSIS — J40 Bronchitis, not specified as acute or chronic: Secondary | ICD-10-CM

## 2018-03-06 DIAGNOSIS — F419 Anxiety disorder, unspecified: Secondary | ICD-10-CM | POA: Diagnosis not present

## 2018-03-06 LAB — POC INFLUENZA A&B (BINAX/QUICKVUE)
INFLUENZA A, POC: NEGATIVE
INFLUENZA B, POC: NEGATIVE

## 2018-03-06 MED ORDER — PREDNISONE 10 MG PO TABS: ORAL_TABLET | ORAL | 0 refills | Status: DC

## 2018-03-06 MED ORDER — HYDROCOD POLST-CPM POLST ER 10-8 MG/5ML PO SUER: 5.0000 mL | Freq: Every evening | ORAL | 0 refills | Status: DC | PRN

## 2018-03-06 MED ORDER — TRAZODONE HCL 50 MG PO TABS: 50.0000 mg | ORAL_TABLET | Freq: Every evening | ORAL | 3 refills | Status: DC | PRN

## 2018-03-06 MED ORDER — AZITHROMYCIN 250 MG PO TABS: ORAL_TABLET | ORAL | 0 refills | Status: DC

## 2018-03-06 MED ORDER — FLUOXETINE HCL 40 MG PO CAPS: 40.0000 mg | ORAL_CAPSULE | Freq: Every day | ORAL | 3 refills | Status: DC

## 2018-03-06 NOTE — Progress Notes (Signed)
Patient ID: Kristi Ramos, female    DOB: [birth year]  Age: 48 y.o. MRN: [id number]    Subjective:  Subjective  HPI Kristi Ramos presents for cough/ congestion >1 week   No fever  Cough is productive and is worse at night.  Review of Systems  Constitutional: Positive for chills. Negative for fever.  HENT: Positive for congestion and postnasal drip. Negative for rhinorrhea and sinus pressure.   Respiratory: Positive for cough and chest tightness. Negative for shortness of breath and wheezing.   Cardiovascular: Negative for chest pain, palpitations and leg swelling.  Allergic/Immunologic: Negative for environmental allergies.    History Past Medical History:  Diagnosis Date  . ADD (attention deficit disorder)   . Anxiety   . Back pain   . Cervical cancer ([address]) 2010  . Chicken pox   . Depression 2009  . Gallbladder problem   . GERD (gastroesophageal reflux disease)   . HTN (hypertension)   . Seasonal allergies   . Uterine cancer ([place]) 2010    She has a past surgical history that includes Cholecystectomy; Ankle surgery (Right); Leg Surgery; and Abdominal hysterectomy (2010).   Her family history includes Alcohol abuse in her maternal grandfather and paternal grandmother; Anxiety disorder in her mother; Arthritis in her father and mother; Depression in her son; Drug abuse in her son; Heart attack in her paternal grandfather; Heart attack (age of onset: 7) in her father; Heart disease in her father and paternal grandfather; Hyperlipidemia in her father; Hypertension in her brother and mother; Stroke in her mother; Thyroid disease in her mother.She reports that she has never smoked. She has never used smokeless tobacco. She reports current alcohol use. She reports that she does not use drugs.  Current Outpatient Medications on File Prior to Visit  Medication Sig Dispense Refill  . lisinopril-hydrochlorothiazide (PRINZIDE,ZESTORETIC) 20-25 MG tablet Take 1 tablet by  mouth daily. 30 tablet 0  . omeprazole (PRILOSEC) 20 MG capsule TAKE 1 CAPSULE DAILY 90 capsule 1   No current facility-administered medications on file prior to visit.      Objective:  Objective  Physical Exam Vitals signs and nursing note reviewed.  Constitutional:      Appearance: She is well-developed.  HENT:     Right Ear: External ear normal.     Left Ear: External ear normal.     Mouth/Throat:     Pharynx: Posterior oropharyngeal erythema present. No pharyngeal swelling or oropharyngeal exudate.  Eyes:     General:        Right eye: No discharge.        Left eye: No discharge.     Conjunctiva/sclera: Conjunctivae normal.  Cardiovascular:     Rate and Rhythm: Normal rate and regular rhythm.     Heart sounds: Normal heart sounds. No murmur.  Pulmonary:     Effort: Pulmonary effort is normal. No respiratory distress.     Breath sounds: Normal breath sounds. Decreased air movement present. No wheezing or rales.  Chest:     Chest wall: No tenderness.  Lymphadenopathy:     Cervical: Cervical adenopathy present.  Neurological:     Mental Status: She is alert and oriented to person, place, and time.    BP 122/80 (BP Location: Right Arm, Cuff Size: Large)   Pulse 76   Temp 97.6 F (36.4 C) (Oral)   Resp 16   Ht 5\' 2"  (1.575 m)   Wt 232 lb 6.4 oz (105.4 kg)   SpO2 98%  BMI 42.51 kg/m  Wt Readings from Last 3 Encounters:  03/06/18 232 lb 6.4 oz (105.4 kg)  11/25/17 231 lb (104.8 kg)  09/17/17 237 lb 3.2 oz (107.6 kg)     Lab Results  Component Value Date   WBC 9.0 06/04/2017   HGB 12.7 06/04/2017   HCT 37.1 06/04/2017   PLT 297.0 06/04/2017   GLUCOSE 113 (H) 11/25/2017   CHOL 171 08/08/2017   TRIG 56.0 08/08/2017   HDL 72.40 08/08/2017   LDLCALC 87 08/08/2017   ALT 112 (H) 11/25/2017   AST 82 (H) 11/25/2017   NA 135 11/25/2017   K 4.0 11/25/2017   CL 99 11/25/2017   CREATININE 0.60 11/25/2017   BUN 17 11/25/2017   CO2 26 11/25/2017   TSH 1.99  11/25/2017   HGBA1C 5.9 08/08/2017    No results found.   Assessment & Plan:  Plan  I have discontinued Kristi Ramos's famotidine, fluticasone, FLUoxetine, predniSONE, metroNIDAZOLE, clindamycin, and ALPRAZolam. I have also changed her traZODone. Additionally, I am having her start on FLUoxetine, predniSONE, azithromycin, and chlorpheniramine-HYDROcodone. Lastly, I am having her maintain her lisinopril-hydrochlorothiazide and omeprazole.  Meds ordered this encounter  Medications  . traZODone (DESYREL) 50 MG tablet    Sig: Take 1-2 tablets (50-100 mg total) by mouth at bedtime as needed. for sleep    Dispense:  60 tablet    Refill:  3    Please consider 90 day supplies to promote better adherence  . FLUoxetine (PROZAC) 40 MG capsule    Sig: Take 1 capsule (40 mg total) by mouth daily.    Dispense:  90 capsule    Refill:  3  . predniSONE (DELTASONE) 10 MG tablet    Sig: TAKE 3 TABLETS PO QD FOR 3 DAYS THEN TAKE 2 TABLETS PO QD FOR 3 DAYS THEN TAKE 1 TABLET PO QD FOR 3 DAYS THEN TAKE 1/2 TAB PO QD FOR 3 DAYS    Dispense:  20 tablet    Refill:  0  . azithromycin (ZITHROMAX Z-PAK) 250 MG tablet    Sig: As directed    Dispense:  6 each    Refill:  0  . chlorpheniramine-HYDROcodone (TUSSIONEX PENNKINETIC ER) 10-8 MG/5ML SUER    Sig: Take 5 mLs by mouth at bedtime as needed for cough.    Dispense:  140 mL    Refill:  0    Problem List Items Addressed This Visit      Unprioritized   Insomnia   Relevant Medications   traZODone (DESYREL) 50 MG tablet    Other Visit Diagnoses    Bronchitis    -  Primary   Relevant Medications   predniSONE (DELTASONE) 10 MG tablet   azithromycin (ZITHROMAX Z-PAK) 250 MG tablet   chlorpheniramine-HYDROcodone (TUSSIONEX PENNKINETIC ER) 10-8 MG/5ML SUER   Anxiety       Relevant Medications   traZODone (DESYREL) 50 MG tablet   FLUoxetine (PROZAC) 40 MG capsule   Chills       Relevant Orders   POC Influenza A&B (Binax test) (Completed)    flu  neg   Follow-up: Return in about 3 months (around 06/05/2018), or if symptoms worsen or fail to improve, for annual exam, fasting.  Ann Held, DO

## 2018-03-06 NOTE — Patient Instructions (Signed)

## 2018-05-21 ENCOUNTER — Other Ambulatory Visit: Payer: Self-pay | Admitting: Family Medicine

## 2018-06-12 ENCOUNTER — Encounter: Payer: Self-pay | Admitting: Family Medicine

## 2018-06-20 ENCOUNTER — Other Ambulatory Visit: Payer: Self-pay | Admitting: Family Medicine

## 2018-06-20 DIAGNOSIS — K219 Gastro-esophageal reflux disease without esophagitis: Secondary | ICD-10-CM

## 2018-08-28 ENCOUNTER — Encounter: Payer: Self-pay | Admitting: Family Medicine

## 2018-08-28 ENCOUNTER — Other Ambulatory Visit: Payer: Self-pay

## 2018-08-28 ENCOUNTER — Ambulatory Visit (INDEPENDENT_AMBULATORY_CARE_PROVIDER_SITE_OTHER): Payer: 59 | Admitting: Family Medicine

## 2018-08-28 VITALS — BP 147/82 | HR 70 | Temp 98.4°F | Resp 18 | Ht 62.0 in | Wt 241.0 lb

## 2018-08-28 DIAGNOSIS — Z0000 Encounter for general adult medical examination without abnormal findings: Secondary | ICD-10-CM | POA: Diagnosis not present

## 2018-08-28 DIAGNOSIS — G4700 Insomnia, unspecified: Secondary | ICD-10-CM | POA: Diagnosis not present

## 2018-08-28 DIAGNOSIS — D229 Melanocytic nevi, unspecified: Secondary | ICD-10-CM

## 2018-08-28 LAB — CBC WITH DIFFERENTIAL/PLATELET
Basophils Absolute: 0 10*3/uL (ref 0.0–0.1)
Basophils Relative: 0.4 % (ref 0.0–3.0)
Eosinophils Absolute: 0 10*3/uL (ref 0.0–0.7)
Eosinophils Relative: 0.4 % (ref 0.0–5.0)
HCT: 37.2 % (ref 36.0–46.0)
Hemoglobin: 12.7 g/dL (ref 12.0–15.0)
Lymphocytes Relative: 15.3 % (ref 12.0–46.0)
Lymphs Abs: 1.4 10*3/uL (ref 0.7–4.0)
MCHC: 34 g/dL (ref 30.0–36.0)
MCV: 90.5 fl (ref 78.0–100.0)
Monocytes Absolute: 0.5 10*3/uL (ref 0.1–1.0)
Monocytes Relative: 5.7 % (ref 3.0–12.0)
Neutro Abs: 7.2 10*3/uL (ref 1.4–7.7)
Neutrophils Relative %: 78.2 % — ABNORMAL HIGH (ref 43.0–77.0)
Platelets: 254 10*3/uL (ref 150.0–400.0)
RBC: 4.11 Mil/uL (ref 3.87–5.11)
RDW: 13.9 % (ref 11.5–15.5)
WBC: 9.3 10*3/uL (ref 4.0–10.5)

## 2018-08-28 LAB — COMPREHENSIVE METABOLIC PANEL
ALT: 71 U/L — ABNORMAL HIGH (ref 0–35)
AST: 42 U/L — ABNORMAL HIGH (ref 0–37)
Albumin: 4.2 g/dL (ref 3.5–5.2)
Alkaline Phosphatase: 69 U/L (ref 39–117)
BUN: 13 mg/dL (ref 6–23)
CO2: 27 mEq/L (ref 19–32)
Calcium: 8.5 mg/dL (ref 8.4–10.5)
Chloride: 103 mEq/L (ref 96–112)
Creatinine, Ser: 0.82 mg/dL (ref 0.40–1.20)
GFR: 74.31 mL/min (ref >60.00)
Glucose, Bld: 86 mg/dL (ref 70–99)
Potassium: 4 mEq/L (ref 3.5–5.1)
Sodium: 137 mEq/L (ref 135–145)
Total Bilirubin: 0.6 mg/dL (ref 0.2–1.2)
Total Protein: 6.4 g/dL (ref 6.0–8.3)

## 2018-08-28 LAB — LIPID PANEL
Cholesterol: 159 mg/dL (ref 0–200)
HDL: 63.9 mg/dL (ref >39.00)
LDL Cholesterol: 83 mg/dL (ref 0–99)
NonHDL: 95.49
Total CHOL/HDL Ratio: 2
Triglycerides: 64 mg/dL (ref 0.0–149.0)
VLDL: 12.8 mg/dL (ref 0.0–40.0)

## 2018-08-28 LAB — TSH: TSH: 1.63 u[IU]/mL (ref 0.35–4.50)

## 2018-08-28 MED ORDER — TRAZODONE HCL 50 MG PO TABS: 50.0000 mg | ORAL_TABLET | Freq: Every evening | ORAL | 3 refills | Status: DC | PRN

## 2018-08-28 NOTE — Progress Notes (Signed)
Subjective:     Kristi Ramos is a 48 y.o. female and is here for a comprehensive physical exam. The patient reports no problems.  Social History   Socioeconomic History  . Marital status: Married    Spouse name: Elta Guadeloupe   . Number of children: Not on file  . Years of education: Not on file  . Highest education level: Not on file  Occupational History  . Occupation: Insurance claims handler: [employer]    Comment: dental hygiene  Social Needs  . Financial resource strain: Not on file  . Food insecurity    Worry: Not on file    Inability: Not on file  . Transportation needs    Medical: Not on file    Non-medical: Not on file  Tobacco Use  . Smoking status: Never Smoker  . Smokeless tobacco: Never Used  Substance and Sexual Activity  . Alcohol use: Yes    Comment: Occ  . Drug use: No  . Sexual activity: Yes    Partners: Male  Lifestyle  . Physical activity    Days per week: Not on file    Minutes per session: Not on file  . Stress: Not on file  Relationships  . Social Herbalist on phone: Not on file    Gets together: Not on file    Attends religious service: Not on file    Active member of club or organization: Not on file    Attends meetings of clubs or organizations: Not on file    Relationship status: Not on file  . Intimate partner violence    Fear of current or ex partner: Not on file    Emotionally abused: Not on file    Physically abused: Not on file    Forced sexual activity: Not on file  Other Topics Concern  . Not on file  Social History Narrative   Exercise--- just started back   Health Maintenance  Topic Date Due  . PNEUMOCOCCAL POLYSACCHARIDE VACCINE AGE 24-64 HIGH RISK  05/10/1972  . FOOT EXAM  05/10/1980  . OPHTHALMOLOGY EXAM  05/10/1980  . HIV Screening  05/10/1985  . PAP SMEAR-Modifier  01/06/2018  . HEMOGLOBIN A1C  02/07/2018  . MAMMOGRAM  02/15/2018  . INFLUENZA VACCINE  09/27/2018  . TETANUS/TDAP   01/04/2026    The following portions of the patient's history were reviewed and updated as appropriate: She  has a past medical history of ADD (attention deficit disorder), Anxiety, Back pain, Cervical cancer ([place]) (2010), Chicken pox, Depression (2009), Gallbladder problem, GERD (gastroesophageal reflux disease), HTN (hypertension), Seasonal allergies, and Uterine cancer ([place]) (2010). She does not have any pertinent problems on file. She  has a past surgical history that includes Cholecystectomy; Ankle surgery (Right); Leg Surgery; and Abdominal hysterectomy (2010). Her family history includes Alcohol abuse in her maternal grandfather and paternal grandmother; Anxiety disorder in her mother; Arthritis in her father and mother; Depression in her son; Drug abuse in her son; Heart attack in her paternal grandfather; Heart attack (age of onset: 20) in her father; Heart disease in her father and paternal grandfather; Hyperlipidemia in her father; Hypertension in her brother and mother; Stroke in her mother; Thyroid disease in her mother. She  reports that she has never smoked. She has never used smokeless tobacco. She reports current alcohol use. She reports that she does not use drugs. She has a current medication list which includes the following prescription(s): fluoxetine, lisinopril-hydrochlorothiazide,  omeprazole, and trazodone. Current Outpatient Medications on File Prior to Visit  Medication Sig Dispense Refill  . FLUoxetine (PROZAC) 20 MG capsule TAKE 1 CAPSULE DAILY 90 capsule 3  . lisinopril-hydrochlorothiazide (PRINZIDE,ZESTORETIC) 20-25 MG tablet Take 1 tablet by mouth daily. 30 tablet 0  . omeprazole (PRILOSEC) 20 MG capsule TAKE 1 CAPSULE DAILY 90 capsule 1   No current facility-administered medications on file prior to visit.    She is allergic to moxifloxacin and augmentin [amoxicillin-pot clavulanate]..  Review of Systems Review of Systems  Constitutional: Negative for activity  change, appetite change and fatigue.  HENT: Negative for hearing loss, congestion, tinnitus and ear discharge.  dentist q30m Eyes: Negative for visual disturbance (see optho q1y -- vision corrected to 20/20 with glasses).  Respiratory: Negative for cough, chest tightness and shortness of breath.   Cardiovascular: Negative for chest pain, palpitations and leg swelling.  Gastrointestinal: Negative for abdominal pain, diarrhea, constipation and abdominal distention.  Genitourinary: Negative for urgency, frequency, decreased urine volume and difficulty urinating.  Musculoskeletal: Negative for back pain, arthralgias and gait problem.  Skin: Negative for color change, pallor and rash.  Neurological: Negative for dizziness, light-headedness, numbness and headaches.  Hematological: Negative for adenopathy. Does not bruise/bleed easily.  Psychiatric/Behavioral: Negative for suicidal ideas, confusion, sleep disturbance, self-injury, dysphoric mood, decreased concentration and agitation.       Objective:    BP (!) 147/82 (BP Location: Left Arm, Patient Position: Sitting, Cuff Size: Normal)   Pulse 70   Temp 98.4 F (36.9 C) (Oral)   Resp 18   Ht 5\' 2"  (1.575 m)   Wt 241 lb (109.3 kg)   SpO2 99%   BMI 44.08 kg/m  General appearance: alert, cooperative, appears stated age and no distress Head: Normocephalic, without obvious abnormality, atraumatic Eyes: negative findings: lids and lashes normal, conjunctivae and sclerae normal and pupils equal, round, reactive to light and accomodation Ears: normal TM's and external ear canals both ears Nose: Nares normal. Septum midline. Mucosa normal. No drainage or sinus tenderness. Throat: lips, mucosa, and tongue normal; teeth and gums normal Neck: no adenopathy, no carotid bruit, no JVD, supple, symmetrical, trachea midline and thyroid not enlarged, symmetric, no tenderness/mass/nodules Back: symmetric, no curvature. ROM normal. No CVA  tenderness. Lungs: clear to auscultation bilaterally Breasts: normal appearance, no masses or tenderness Heart: regular rate and rhythm, S1, S2 normal, no murmur, click, rub or gallop Abdomen: soft, non-tender; bowel sounds normal; no masses,  no organomegaly Pelvic: deferred Extremities: extremities normal, atraumatic, no cyanosis or edema Pulses: 2+ and symmetric Skin: Skin color, texture, turgor normal. No rashes or lesions Lymph nodes: Cervical, supraclavicular, and axillary nodes normal. Neurologic: Alert and oriented X 3, normal strength and tone. Normal symmetric reflexes. Normal coordination and gait    Assessment:    Healthy female exam.      Plan:     ghm utd Check labs  See After Visit Summary for Counseling Recommendations    1. Insomnia Stable, refill meds  - traZODone (DESYREL) 50 MG tablet; Take 1-2 tablets (50-100 mg total) by mouth at bedtime as needed. for sleep  Dispense: 60 tablet; Refill: 3  2. Numerous moles  - Ambulatory referral to Dermatology  3. Preventative health care See above  - CBC with Differential/Platelet - Lipid panel - Comprehensive metabolic panel - TSH

## 2018-08-31 ENCOUNTER — Other Ambulatory Visit: Payer: Self-pay | Admitting: Family Medicine

## 2018-08-31 DIAGNOSIS — R748 Abnormal levels of other serum enzymes: Secondary | ICD-10-CM

## 2018-11-06 ENCOUNTER — Other Ambulatory Visit: Payer: Self-pay | Admitting: Family Medicine

## 2018-11-06 DIAGNOSIS — I10 Essential (primary) hypertension: Secondary | ICD-10-CM

## 2018-12-01 ENCOUNTER — Other Ambulatory Visit: Payer: Self-pay

## 2018-12-01 DIAGNOSIS — Z20822 Contact with and (suspected) exposure to covid-19: Secondary | ICD-10-CM

## 2018-12-02 LAB — NOVEL CORONAVIRUS, NAA: SARS-CoV-2, NAA: NOT DETECTED

## 2018-12-03 ENCOUNTER — Telehealth: Payer: Self-pay

## 2018-12-03 NOTE — Telephone Encounter (Signed)
Spoke with patient. Letter and results placed at the front

## 2018-12-03 NOTE — Telephone Encounter (Signed)
Copied from [place] [id number]. Topic: General - Inquiry >> Dec 03, 2018 10:47 AM Reyne Dumas L wrote: Reason for CRM:  Pt needs printed COVID results and return to work letter.  Please call pt when ready to be picked up. Pt can be reached at [phone]

## 2018-12-17 ENCOUNTER — Other Ambulatory Visit: Payer: Self-pay | Admitting: Family Medicine

## 2018-12-17 DIAGNOSIS — K219 Gastro-esophageal reflux disease without esophagitis: Secondary | ICD-10-CM

## 2019-01-26 ENCOUNTER — Other Ambulatory Visit: Payer: Self-pay | Admitting: Family Medicine

## 2019-01-26 DIAGNOSIS — G4700 Insomnia, unspecified: Secondary | ICD-10-CM

## 2019-03-04 ENCOUNTER — Other Ambulatory Visit: Payer: Self-pay

## 2019-03-05 ENCOUNTER — Other Ambulatory Visit: Payer: Self-pay | Admitting: Family Medicine

## 2019-03-05 ENCOUNTER — Other Ambulatory Visit: Payer: Self-pay

## 2019-03-05 ENCOUNTER — Other Ambulatory Visit (HOSPITAL_BASED_OUTPATIENT_CLINIC_OR_DEPARTMENT_OTHER): Payer: Self-pay | Admitting: Family Medicine

## 2019-03-05 ENCOUNTER — Ambulatory Visit: Payer: 59 | Admitting: Family Medicine

## 2019-03-05 ENCOUNTER — Encounter: Payer: Self-pay | Admitting: Family Medicine

## 2019-03-05 VITALS — BP 118/70 | HR 72 | Temp 97.1°F | Resp 18 | Ht 62.0 in | Wt 234.8 lb

## 2019-03-05 DIAGNOSIS — Z1231 Encounter for screening mammogram for malignant neoplasm of breast: Secondary | ICD-10-CM

## 2019-03-05 DIAGNOSIS — I10 Essential (primary) hypertension: Secondary | ICD-10-CM

## 2019-03-05 DIAGNOSIS — R748 Abnormal levels of other serum enzymes: Secondary | ICD-10-CM

## 2019-03-05 DIAGNOSIS — G4700 Insomnia, unspecified: Secondary | ICD-10-CM | POA: Diagnosis not present

## 2019-03-05 DIAGNOSIS — Z23 Encounter for immunization: Secondary | ICD-10-CM

## 2019-03-05 LAB — COMPREHENSIVE METABOLIC PANEL
ALT: 96 U/L — ABNORMAL HIGH (ref 0–35)
AST: 55 U/L — ABNORMAL HIGH (ref 0–37)
Albumin: 4.3 g/dL (ref 3.5–5.2)
Alkaline Phosphatase: 65 U/L (ref 39–117)
BUN: 11 mg/dL (ref 6–23)
CO2: 30 mEq/L (ref 19–32)
Calcium: 9.4 mg/dL (ref 8.4–10.5)
Chloride: 99 mEq/L (ref 96–112)
Creatinine, Ser: 0.77 mg/dL (ref 0.40–1.20)
GFR: 79.74 mL/min (ref >60.00)
Glucose, Bld: 124 mg/dL — ABNORMAL HIGH (ref 70–99)
Potassium: 4.4 mEq/L (ref 3.5–5.1)
Sodium: 138 mEq/L (ref 135–145)
Total Bilirubin: 0.5 mg/dL (ref 0.2–1.2)
Total Protein: 6.7 g/dL (ref 6.0–8.3)

## 2019-03-05 LAB — SARS-COV-2 IGG: SARS-COV-2 IgG: 0.03

## 2019-03-05 MED ORDER — TRAZODONE HCL 50 MG PO TABS: 50.0000 mg | ORAL_TABLET | Freq: Every evening | ORAL | 0 refills | Status: DC | PRN

## 2019-03-05 NOTE — Assessment & Plan Note (Signed)
Stable con't meds 

## 2019-03-05 NOTE — Assessment & Plan Note (Signed)
Well controlled, no changes to meds. Encouraged heart healthy diet such as the DASH diet and exercise as tolerated.  °

## 2019-03-05 NOTE — Progress Notes (Signed)
Patient ID: Kristi Ramos, female    DOB: Jun 30, 1970  Age: 49 y.o. MRN: [id number]    Subjective:  Subjective  HPI Kerry-Ann Flager presents for f/u trazadone and bp.  No complaints   Review of Systems  Constitutional: Negative for appetite change, diaphoresis, fatigue and unexpected weight change.  Eyes: Negative for pain, redness and visual disturbance.  Respiratory: Negative for cough, chest tightness, shortness of breath and wheezing.   Cardiovascular: Negative for chest pain, palpitations and leg swelling.  Endocrine: Negative for cold intolerance, heat intolerance, polydipsia, polyphagia and polyuria.  Genitourinary: Negative for difficulty urinating, dysuria and frequency.  Neurological: Negative for dizziness, light-headedness, numbness and headaches.    History Past Medical History:  Diagnosis Date  . ADD (attention deficit disorder)   . Anxiety   . Back pain   . Cervical cancer ([place]) 2010  . Chicken pox   . Depression 2009  . Gallbladder problem   . GERD (gastroesophageal reflux disease)   . HTN (hypertension)   . Seasonal allergies   . Uterine cancer ([place]) 2010    She has a past surgical history that includes Cholecystectomy; Ankle surgery (Right); Leg Surgery; and Abdominal hysterectomy (2010).   Her family history includes Alcohol abuse in her maternal grandfather and paternal grandmother; Anxiety disorder in her mother; Arthritis in her father and mother; Depression in her son; Drug abuse in her son; Heart attack in her paternal grandfather; Heart attack (age of onset: 45) in her father; Heart disease in her father and paternal grandfather; Hyperlipidemia in her father; Hypertension in her brother and mother; Stroke in her mother; Thyroid disease in her mother.She reports that she has never smoked. She has never used smokeless tobacco. She reports current alcohol use. She reports that she does not use drugs.  Current Outpatient Medications on File Prior  to Visit  Medication Sig Dispense Refill  . FLUoxetine (PROZAC) 20 MG capsule TAKE 1 CAPSULE DAILY 90 capsule 3  . lisinopril-hydrochlorothiazide (ZESTORETIC) 20-25 MG tablet TAKE 1 TABLET DAILY (DOSE INCREASE) 90 tablet 3  . omeprazole (PRILOSEC) 20 MG capsule TAKE 1 CAPSULE DAILY 90 capsule 3   No current facility-administered medications on file prior to visit.     Objective:  Objective  Physical Exam Vitals and nursing note reviewed.  Constitutional:      Appearance: She is well-developed.  HENT:     Head: Normocephalic and atraumatic.  Eyes:     Conjunctiva/sclera: Conjunctivae normal.  Neck:     Thyroid: No thyromegaly.     Vascular: No carotid bruit or JVD.  Cardiovascular:     Rate and Rhythm: Normal rate and regular rhythm.     Heart sounds: Normal heart sounds. No murmur.  Pulmonary:     Effort: Pulmonary effort is normal. No respiratory distress.     Breath sounds: Normal breath sounds. No wheezing or rales.  Chest:     Chest wall: No tenderness.  Musculoskeletal:     Cervical back: Normal range of motion and neck supple.  Neurological:     Mental Status: She is alert and oriented to person, place, and time.    BP 118/70 (BP Location: Right Arm, Patient Position: Sitting, Cuff Size: Normal)   Pulse 72   Temp (!) 97.1 F (36.2 C) (Temporal)   Resp 18   Ht 5\' 2"  (1.575 m)   Wt 234 lb 12.8 oz (106.5 kg)   SpO2 97%   BMI 42.95 kg/m  Wt Readings from Last 3  Encounters:  03/05/19 234 lb 12.8 oz (106.5 kg)  08/28/18 241 lb (109.3 kg)  03/06/18 232 lb 6.4 oz (105.4 kg)     Lab Results  Component Value Date   WBC 9.3 08/28/2018   HGB 12.7 08/28/2018   HCT 37.2 08/28/2018   PLT 254.0 08/28/2018   GLUCOSE 86 08/28/2018   CHOL 159 08/28/2018   TRIG 64.0 08/28/2018   HDL 63.90 08/28/2018   LDLCALC 83 08/28/2018   ALT 71 (H) 08/28/2018   AST 42 (H) 08/28/2018   NA 137 08/28/2018   K 4.0 08/28/2018   CL 103 08/28/2018   CREATININE 0.82 08/28/2018    BUN 13 08/28/2018   CO2 27 08/28/2018   TSH 1.63 08/28/2018   HGBA1C 5.9 08/08/2017    No results found.   Assessment & Plan:  Plan  I have changed Nyazia B. Ahart's traZODone. I am also having her maintain her FLUoxetine, lisinopril-hydrochlorothiazide, and omeprazole.  Meds ordered this encounter  Medications  . traZODone (DESYREL) 50 MG tablet    Sig: Take 1-2 tablets (50-100 mg total) by mouth at bedtime as needed. for sleep    Dispense:  60 tablet    Refill:  0    Problem List Items Addressed This Visit      Unprioritized   Essential hypertension    Well controlled, no changes to meds. Encouraged heart healthy diet such as the DASH diet and exercise as tolerated.       Insomnia - Primary    Stable con't meds       Relevant Medications   traZODone (DESYREL) 50 MG tablet    Other Visit Diagnoses    Need for immunization against influenza       Relevant Orders   Flu Vaccine QUAD 6+ mos PF IM (Fluarix Quad PF) (Completed)   Elevated liver enzymes       Relevant Orders   SARS-COV-2 IgG   Comprehensive metabolic panel      Follow-up: Return in about 6 months (around 09/02/2019), or if symptoms worsen or fail to improve, for annual exam, fasting.  Ann Held, DO

## 2019-03-05 NOTE — Patient Instructions (Signed)
DASH Eating Plan DASH stands for "Dietary Approaches to Stop Hypertension." The DASH eating plan is a healthy eating plan that has been shown to reduce high blood pressure (hypertension). It may also reduce your risk for type 2 diabetes, heart disease, and stroke. The DASH eating plan may also help with weight loss. What are tips for following this plan?  General guidelines  Avoid eating more than 2,300 mg (milligrams) of salt (sodium) a day. If you have hypertension, you may need to reduce your sodium intake to 1,500 mg a day.  Limit alcohol intake to no more than 1 drink a day for nonpregnant women and 2 drinks a day for men. One drink equals 12 oz of beer, 5 oz of wine, or 1 oz of hard liquor.  Work with your health care provider to maintain a healthy body weight or to lose weight. Ask what an ideal weight is for you.  Get at least 30 minutes of exercise that causes your heart to beat faster (aerobic exercise) most days of the week. Activities may include walking, swimming, or biking.  Work with your health care provider or diet and nutrition specialist (dietitian) to adjust your eating plan to your individual calorie needs. Reading food labels   Check food labels for the amount of sodium per serving. Choose foods with less than 5 percent of the Daily Value of sodium. Generally, foods with less than 300 mg of sodium per serving fit into this eating plan.  To find whole grains, look for the word "whole" as the first word in the ingredient list. Shopping  Buy products labeled as "low-sodium" or "no salt added."  Buy fresh foods. Avoid canned foods and premade or frozen meals. Cooking  Avoid adding salt when cooking. Use salt-free seasonings or herbs instead of table salt or sea salt. Check with your health care provider or pharmacist before using salt substitutes.  Do not fry foods. Cook foods using healthy methods such as baking, boiling, grilling, and broiling instead.  Cook with  heart-healthy oils, such as olive, canola, soybean, or sunflower oil. Meal planning  Eat a balanced diet that includes: ? 5 or more servings of fruits and vegetables each day. At each meal, try to fill half of your plate with fruits and vegetables. ? Up to 6-8 servings of whole grains each day. ? Less than 6 oz of lean meat, poultry, or fish each day. A 3-oz serving of meat is about the same size as a deck of cards. One egg equals 1 oz. ? 2 servings of low-fat dairy each day. ? A serving of nuts, seeds, or beans 5 times each week. ? Heart-healthy fats. Healthy fats called Omega-3 fatty acids are found in foods such as flaxseeds and coldwater fish, like sardines, salmon, and mackerel.  Limit how much you eat of the following: ? Canned or prepackaged foods. ? Food that is high in trans fat, such as fried foods. ? Food that is high in saturated fat, such as fatty meat. ? Sweets, desserts, sugary drinks, and other foods with added sugar. ? Full-fat dairy products.  Do not salt foods before eating.  Try to eat at least 2 vegetarian meals each week.  Eat more home-cooked food and less restaurant, buffet, and fast food.  When eating at a restaurant, ask that your food be prepared with less salt or no salt, if possible. What foods are recommended? The items listed may not be a complete list. Talk with your dietitian about   what dietary choices are best for you. Grains Whole-grain or whole-wheat bread. Whole-grain or whole-wheat pasta. Brown rice. Oatmeal. Quinoa. Bulgur. Whole-grain and low-sodium cereals. Pita bread. Low-fat, low-sodium crackers. Whole-wheat flour tortillas. Vegetables Fresh or frozen vegetables (raw, steamed, roasted, or grilled). Low-sodium or reduced-sodium tomato and vegetable juice. Low-sodium or reduced-sodium tomato sauce and tomato paste. Low-sodium or reduced-sodium canned vegetables. Fruits All fresh, dried, or frozen fruit. Canned fruit in natural juice (without  added sugar). Meat and other protein foods Skinless chicken or turkey. Ground chicken or turkey. Pork with fat trimmed off. Fish and seafood. Egg whites. Dried beans, peas, or lentils. Unsalted nuts, nut butters, and seeds. Unsalted canned beans. Lean cuts of beef with fat trimmed off. Low-sodium, lean deli meat. Dairy Low-fat (1%) or fat-free (skim) milk. Fat-free, low-fat, or reduced-fat cheeses. Nonfat, low-sodium ricotta or cottage cheese. Low-fat or nonfat yogurt. Low-fat, low-sodium cheese. Fats and oils Soft margarine without trans fats. Vegetable oil. Low-fat, reduced-fat, or light mayonnaise and salad dressings (reduced-sodium). Canola, safflower, olive, soybean, and sunflower oils. Avocado. Seasoning and other foods Herbs. Spices. Seasoning mixes without salt. Unsalted popcorn and pretzels. Fat-free sweets. What foods are not recommended? The items listed may not be a complete list. Talk with your dietitian about what dietary choices are best for you. Grains Baked goods made with fat, such as croissants, muffins, or some breads. Dry pasta or rice meal packs. Vegetables Creamed or fried vegetables. Vegetables in a cheese sauce. Regular canned vegetables (not low-sodium or reduced-sodium). Regular canned tomato sauce and paste (not low-sodium or reduced-sodium). Regular tomato and vegetable juice (not low-sodium or reduced-sodium). Pickles. Olives. Fruits Canned fruit in a light or heavy syrup. Fried fruit. Fruit in cream or butter sauce. Meat and other protein foods Fatty cuts of meat. Ribs. Fried meat. Bacon. Sausage. Bologna and other processed lunch meats. Salami. Fatback. Hotdogs. Bratwurst. Salted nuts and seeds. Canned beans with added salt. Canned or smoked fish. Whole eggs or egg yolks. Chicken or turkey with skin. Dairy Whole or 2% milk, cream, and half-and-half. Whole or full-fat cream cheese. Whole-fat or sweetened yogurt. Full-fat cheese. Nondairy creamers. Whipped toppings.  Processed cheese and cheese spreads. Fats and oils Butter. Stick margarine. Lard. Shortening. Ghee. Bacon fat. Tropical oils, such as coconut, palm kernel, or palm oil. Seasoning and other foods Salted popcorn and pretzels. Onion salt, garlic salt, seasoned salt, table salt, and sea salt. Worcestershire sauce. Tartar sauce. Barbecue sauce. Teriyaki sauce. Soy sauce, including reduced-sodium. Steak sauce. Canned and packaged gravies. Fish sauce. Oyster sauce. Cocktail sauce. Horseradish that you find on the shelf. Ketchup. Mustard. Meat flavorings and tenderizers. Bouillon cubes. Hot sauce and Tabasco sauce. Premade or packaged marinades. Premade or packaged taco seasonings. Relishes. Regular salad dressings. Where to find more information:  National Heart, Lung, and Blood Institute: www.nhlbi.nih.gov  American Heart Association: www.heart.org Summary  The DASH eating plan is a healthy eating plan that has been shown to reduce high blood pressure (hypertension). It may also reduce your risk for type 2 diabetes, heart disease, and stroke.  With the DASH eating plan, you should limit salt (sodium) intake to 2,300 mg a day. If you have hypertension, you may need to reduce your sodium intake to 1,500 mg a day.  When on the DASH eating plan, aim to eat more fresh fruits and vegetables, whole grains, lean proteins, low-fat dairy, and heart-healthy fats.  Work with your health care provider or diet and nutrition specialist (dietitian) to adjust your eating plan to your   individual calorie needs. This information is not intended to replace advice given to you by your health care provider. Make sure you discuss any questions you have with your health care provider. Document Revised: 01/25/2017 Document Reviewed: 02/06/2016 Elsevier Patient Education  2020 Elsevier Inc.  

## 2019-03-10 ENCOUNTER — Encounter: Payer: Self-pay | Admitting: Family Medicine

## 2019-03-11 ENCOUNTER — Other Ambulatory Visit: Payer: Self-pay | Admitting: Family Medicine

## 2019-03-11 DIAGNOSIS — Z202 Contact with and (suspected) exposure to infections with a predominantly sexual mode of transmission: Secondary | ICD-10-CM

## 2019-03-11 NOTE — Telephone Encounter (Signed)
I just saw her in the office--  I put the order in for labs and urine

## 2019-03-12 ENCOUNTER — Other Ambulatory Visit (HOSPITAL_COMMUNITY)
Admission: RE | Admit: 2019-03-12 | Discharge: 2019-03-12 | Disposition: A | Discharge status: Home / Self Care | Payer: 59 | Source: Ambulatory Visit | Attending: Family Medicine | Admitting: Family Medicine

## 2019-03-12 ENCOUNTER — Other Ambulatory Visit: Payer: Self-pay

## 2019-03-12 ENCOUNTER — Ambulatory Visit (HOSPITAL_BASED_OUTPATIENT_CLINIC_OR_DEPARTMENT_OTHER)
Admission: RE | Admit: 2019-03-12 | Discharge: 2019-03-12 | Disposition: A | Discharge status: Home / Self Care | Payer: 59 | Source: Ambulatory Visit | Attending: Family Medicine | Admitting: Family Medicine

## 2019-03-12 ENCOUNTER — Other Ambulatory Visit (INDEPENDENT_AMBULATORY_CARE_PROVIDER_SITE_OTHER): Payer: 59

## 2019-03-12 ENCOUNTER — Other Ambulatory Visit: Payer: 59

## 2019-03-12 DIAGNOSIS — Z202 Contact with and (suspected) exposure to infections with a predominantly sexual mode of transmission: Secondary | ICD-10-CM

## 2019-03-12 DIAGNOSIS — Z1231 Encounter for screening mammogram for malignant neoplasm of breast: Secondary | ICD-10-CM | POA: Diagnosis present

## 2019-03-13 ENCOUNTER — Other Ambulatory Visit: Payer: 59

## 2019-03-13 ENCOUNTER — Ambulatory Visit (HOSPITAL_BASED_OUTPATIENT_CLINIC_OR_DEPARTMENT_OTHER): Payer: 59

## 2019-03-15 ENCOUNTER — Encounter: Payer: Self-pay | Admitting: Family Medicine

## 2019-03-16 LAB — URINE CYTOLOGY ANCILLARY ONLY
Bacterial Vaginitis-Urine: NEGATIVE
Candida Urine: NEGATIVE
Chlamydia: NEGATIVE
Comment: NEGATIVE
Comment: NEGATIVE
Comment: NORMAL
Neisseria Gonorrhea: NEGATIVE
Trichomonas: NEGATIVE

## 2019-03-16 LAB — HEPATITIS B SURFACE ANTIGEN: Hepatitis B Surface Ag: NONREACTIVE

## 2019-03-16 LAB — HSV(HERPES SIMPLEX VRS) I + II AB-IGG
HAV 1 IGG,TYPE SPECIFIC AB: <0.9 index
HSV 2 IGG,TYPE SPECIFIC AB: <0.9 index

## 2019-03-16 LAB — HEPATITIS C ANTIBODY
Hepatitis C Ab: NONREACTIVE
SIGNAL TO CUT-OFF: 0.01 (ref <1.00)

## 2019-03-16 LAB — RPR: RPR Ser Ql: REACTIVE — AB

## 2019-03-16 LAB — RPR TITER: RPR Titer: 1:1 {titer} — ABNORMAL HIGH

## 2019-03-16 LAB — FLUORESCENT TREPONEMAL AB(FTA)-IGG-BLD: Fluorescent Treponemal ABS: NONREACTIVE

## 2019-03-16 LAB — HIV ANTIBODY (ROUTINE TESTING W REFLEX): HIV 1&2 Ab, 4th Generation: NONREACTIVE

## 2019-03-17 ENCOUNTER — Other Ambulatory Visit: Payer: Self-pay | Admitting: Family Medicine

## 2019-03-17 DIAGNOSIS — R928 Other abnormal and inconclusive findings on diagnostic imaging of breast: Secondary | ICD-10-CM

## 2019-03-26 ENCOUNTER — Other Ambulatory Visit: Payer: Self-pay

## 2019-03-26 ENCOUNTER — Ambulatory Visit
Admission: RE | Admit: 2019-03-26 | Discharge: 2019-03-26 | Disposition: A | Discharge status: Home / Self Care | Payer: 59 | Source: Ambulatory Visit | Attending: Family Medicine | Admitting: Family Medicine

## 2019-03-26 DIAGNOSIS — R928 Other abnormal and inconclusive findings on diagnostic imaging of breast: Secondary | ICD-10-CM

## 2019-04-06 ENCOUNTER — Other Ambulatory Visit: Payer: Self-pay | Admitting: Family Medicine

## 2019-04-06 DIAGNOSIS — G4700 Insomnia, unspecified: Secondary | ICD-10-CM

## 2019-04-07 MED ORDER — TRAZODONE HCL 50 MG PO TABS: 50.0000 mg | ORAL_TABLET | Freq: Every evening | ORAL | 2 refills | Status: DC | PRN

## 2019-05-16 ENCOUNTER — Other Ambulatory Visit: Payer: Self-pay | Admitting: Family Medicine

## 2019-05-22 ENCOUNTER — Ambulatory Visit: Payer: 59 | Attending: Internal Medicine

## 2019-05-22 DIAGNOSIS — Z23 Encounter for immunization: Secondary | ICD-10-CM

## 2019-05-22 NOTE — Progress Notes (Signed)
   Covid-19 Vaccination Clinic  Name:  Kristi Ramos    MRN: [id number] DOB: [birth year]  05/22/2019  [name] was observed post Covid-19 immunization for 15 minutes without incident. She was provided with Vaccine Information Sheet and instruction to access the V-Safe system.   [name] was instructed to call 911 with any severe reactions post vaccine: Marland Kitchen Difficulty breathing  . Swelling of face and throat  . A fast heartbeat  . A bad rash all over body  . Dizziness and weakness   Immunizations Administered    Name Date Dose VIS Date Route   Pfizer COVID-19 Vaccine 05/22/2019 10:52 AM 0.3 mL 02/06/2019 Intramuscular   Manufacturer: [place]   Lot: [id number]   [place]: [id number]

## 2019-06-17 ENCOUNTER — Ambulatory Visit: Payer: Self-pay

## 2019-06-24 ENCOUNTER — Ambulatory Visit: Payer: 59 | Attending: Internal Medicine

## 2019-06-24 DIAGNOSIS — Z23 Encounter for immunization: Secondary | ICD-10-CM

## 2019-06-24 NOTE — Progress Notes (Signed)
   Covid-19 Vaccination Clinic  Name:  Kristi Ramos    MRN: [id number] DOB: 02/13/1971  06/24/2019  Ms. Kristi Ramos was observed post Covid-19 immunization for 15 minutes without incident. She was provided with Vaccine Information Sheet and instruction to access the V-Safe system.   Ms. Kristi Ramos was instructed to call 911 with any severe reactions post vaccine: Marland Kitchen Difficulty breathing  . Swelling of face and throat  . A fast heartbeat  . A bad rash all over body  . Dizziness and weakness   Immunizations Administered    Name Date Dose VIS Date Route   Pfizer COVID-19 Vaccine 06/24/2019  3:23 PM 0.3 mL 04/22/2018 Intramuscular   Manufacturer: [place]   Lot: [id number]   [place]: [id number]

## 2019-06-29 ENCOUNTER — Emergency Department (HOSPITAL_COMMUNITY)
Admission: EM | Admit: 2019-06-29 | Discharge: 2019-06-29 | Disposition: A | Discharge status: Home / Self Care | Payer: 59 | Attending: Emergency Medicine | Admitting: Emergency Medicine

## 2019-06-29 ENCOUNTER — Encounter (HOSPITAL_COMMUNITY): Payer: Self-pay | Admitting: Emergency Medicine

## 2019-06-29 ENCOUNTER — Other Ambulatory Visit: Payer: Self-pay

## 2019-06-29 ENCOUNTER — Emergency Department (HOSPITAL_COMMUNITY): Payer: 59

## 2019-06-29 DIAGNOSIS — R0789 Other chest pain: Secondary | ICD-10-CM | POA: Insufficient documentation

## 2019-06-29 DIAGNOSIS — E876 Hypokalemia: Secondary | ICD-10-CM | POA: Insufficient documentation

## 2019-06-29 DIAGNOSIS — I10 Essential (primary) hypertension: Secondary | ICD-10-CM | POA: Diagnosis not present

## 2019-06-29 DIAGNOSIS — R0602 Shortness of breath: Secondary | ICD-10-CM | POA: Insufficient documentation

## 2019-06-29 DIAGNOSIS — Z79899 Other long term (current) drug therapy: Secondary | ICD-10-CM | POA: Insufficient documentation

## 2019-06-29 DIAGNOSIS — R42 Dizziness and giddiness: Secondary | ICD-10-CM | POA: Diagnosis not present

## 2019-06-29 DIAGNOSIS — R002 Palpitations: Secondary | ICD-10-CM

## 2019-06-29 DIAGNOSIS — H538 Other visual disturbances: Secondary | ICD-10-CM | POA: Diagnosis not present

## 2019-06-29 DIAGNOSIS — Z8541 Personal history of malignant neoplasm of cervix uteri: Secondary | ICD-10-CM | POA: Diagnosis not present

## 2019-06-29 LAB — COMPREHENSIVE METABOLIC PANEL
ALT: 63 U/L — ABNORMAL HIGH (ref 0–44)
AST: 35 U/L (ref 15–41)
Albumin: 4 g/dL (ref 3.5–5.0)
Alkaline Phosphatase: 70 U/L (ref 38–126)
Anion gap: 14 (ref 5–15)
BUN: 5 mg/dL — ABNORMAL LOW (ref 6–20)
CO2: 24 mmol/L (ref 22–32)
Calcium: 8.9 mg/dL (ref 8.9–10.3)
Chloride: 100 mmol/L (ref 98–111)
Creatinine, Ser: 0.64 mg/dL (ref 0.44–1.00)
GFR calc Af Amer: >60 mL/min (ref >60)
GFR calc non Af Amer: >60 mL/min (ref >60)
Glucose, Bld: 104 mg/dL — ABNORMAL HIGH (ref 70–99)
Potassium: 3.3 mmol/L — ABNORMAL LOW (ref 3.5–5.1)
Sodium: 138 mmol/L (ref 135–145)
Total Bilirubin: 1.3 mg/dL — ABNORMAL HIGH (ref 0.3–1.2)
Total Protein: 6.8 g/dL (ref 6.5–8.1)

## 2019-06-29 LAB — CBC
HCT: 41 % (ref 36.0–46.0)
Hemoglobin: 14.2 g/dL (ref 12.0–15.0)
MCH: 31.4 pg (ref 26.0–34.0)
MCHC: 34.6 g/dL (ref 30.0–36.0)
MCV: 90.7 fL (ref 80.0–100.0)
Platelets: 352 10*3/uL (ref 150–400)
RBC: 4.52 MIL/uL (ref 3.87–5.11)
RDW: 13.2 % (ref 11.5–15.5)
WBC: 10.1 10*3/uL (ref 4.0–10.5)
nRBC: 0 % (ref 0.0–0.2)

## 2019-06-29 LAB — TROPONIN I (HIGH SENSITIVITY): Troponin I (High Sensitivity): 3 ng/L (ref <18)

## 2019-06-29 NOTE — ED Provider Notes (Signed)
[facility] EMERGENCY DEPARTMENT Provider Note   CSN: [id number] Arrival date & time: 06/29/19  1224     History Chief Complaint  Patient presents with  . Dizziness    Sheranda Menk is a 49 y.o. female.  HPI HPI Comments: Ulissa Clunie is a 49 y.o. female with history of anxiety, depression, hypertension who presents to the Emergency Department complaining of chest tightness.  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  During this episode she also experienced blurry vision and shortness of breath which has since alleviated and states she feels "nervous".  She states she wears a watch which measures her heart rate and states that it was about "128" during this encounter.  She denies a history of similar symptoms.  She states she received the second dose of her COVID-19 vaccination about 5 days ago.  She is on fluoxetine for depression but denies any recent changes with this or any of her medications.  She drinks about 6 alcoholic beverages per week.  She had about 1.5 glasses of wine last night.  She is not a smoker and she does not do recreational drugs.  She denies any current blurry vision, chest pain, shortness of breath, URI symptoms, fevers, chills, abdominal pain, nausea, vomiting, diarrhea, urinary changes, syncope.    Past Medical History:  Diagnosis Date  . ADD (attention deficit disorder)   . Anxiety   . Back pain   . Cervical cancer ([place]) 2010  . Chicken pox   . Depression 2009  . Gallbladder problem   . GERD (gastroesophageal reflux disease)   . HTN (hypertension)   . Seasonal allergies   . Uterine cancer [facility]) 2010    Patient Active Problem List   Diagnosis Date Noted  . Seasonal allergies 08/10/2017  . Hyperglycemia 08/10/2017  . Cyst of skin 08/10/2017  . Other specified menopausal and perimenopausal disorders 04/18/2017    . Essential hypertension 04/18/2017  . Vitamin D deficiency 12/13/2016  . Elevated LFTs 12/13/2016  . Prediabetes 12/13/2016  . Class 3 severe obesity with serious comorbidity and body mass index (BMI) of 40.0 to 44.9 in adult ([place]) 12/13/2016  . Other fatigue 11/29/2016  . Shortness of breath on exertion 11/29/2016  . Class 3 severe obesity with serious comorbidity and body mass index (BMI) of 45.0 to 49.9 in adult ([place]) 11/29/2016  . Palpitations 08/24/2016  . Acute upper respiratory infection 05/05/2015  . HTN (hypertension) 03/10/2015  . Morbid obesity ([place]) 03/27/2014  . Insomnia 03/27/2014  . Diarrhea 12/28/2013  . Pain in the abdomen 12/28/2013  . ANXIETY 06/24/2008  . URINARY URGENCY 06/02/2008  . DYSURIA 11/18/2007  . OSTEOMYELITIS NOS, LOWER LEG 12/25/2006    Past Surgical History:  Procedure Laterality Date  . ABDOMINAL HYSTERECTOMY  2010   TAH-- cancer  . ANKLE SURGERY Right    Osteomyelitis  . CHOLECYSTECTOMY    . LEG SURGERY     Calf Muscle Surgery     OB History    Gravida  1   Para      Term      Preterm      AB      Living  1     SAB      TAB      Ectopic      Multiple      Live Births  Family History  Problem Relation Age of Onset  . Arthritis Mother   . Stroke Mother   . Hypertension Mother   . Thyroid disease Mother   . Anxiety disorder Mother   . Arthritis Father   . Hyperlipidemia Father   . Heart disease Father   . Heart attack Father 36  . Hypertension Brother   . Drug abuse Son   . Alcohol abuse Paternal Grandmother   . Alcohol abuse Maternal Grandfather   . Heart disease Paternal Grandfather   . Heart attack Paternal Grandfather   . Depression Son     Social History   Tobacco Use  . Smoking status: Never Smoker  . Smokeless tobacco: Never Used  Substance Use Topics  . Alcohol use: Yes    Comment: Occ  . Drug use: No    Home Medications Prior to Admission medications   Medication Sig  Start Date End Date Taking? Authorizing Provider  FLUoxetine (PROZAC) 20 MG capsule TAKE 1 CAPSULE DAILY 05/17/19   Carollee Herter, Alferd Apa, DO  lisinopril-hydrochlorothiazide (ZESTORETIC) 20-25 MG tablet TAKE 1 TABLET DAILY (DOSE INCREASE) 11/07/18   Carollee Herter, Alferd Apa, DO  omeprazole (PRILOSEC) 20 MG capsule TAKE 1 CAPSULE DAILY 12/17/18   Carollee Herter, Alferd Apa, DO  traZODone (DESYREL) 50 MG tablet Take 1-2 tablets (50-100 mg total) by mouth at bedtime as needed. for sleep 04/07/19   Roma Schanz R, DO    Allergies    Moxifloxacin and Augmentin [amoxicillin-pot clavulanate]  Review of Systems   Review of Systems  All other systems reviewed and are negative. Ten systems reviewed and are negative for acute change, except as noted in the HPI.   Physical Exam Updated Vital Signs SpO2 98%   Physical Exam Vitals and nursing note reviewed.  Constitutional:      General: She is not in acute distress.    Appearance: Normal appearance. She is not ill-appearing, toxic-appearing or diaphoretic.  HENT:     Head: Normocephalic and atraumatic.     Right Ear: External ear normal.     Left Ear: External ear normal.     Nose: Nose normal.     Mouth/Throat:     Mouth: Mucous membranes are moist.     Pharynx: Oropharynx is clear. No oropharyngeal exudate or posterior oropharyngeal erythema.  Eyes:     Extraocular Movements: Extraocular movements intact.  Cardiovascular:     Rate and Rhythm: Normal rate and regular rhythm.     Pulses: Normal pulses.     Heart sounds: Normal heart sounds. No murmur. No friction rub. No gallop.   Pulmonary:     Effort: Pulmonary effort is normal. No respiratory distress.     Breath sounds: Normal breath sounds. No stridor. No wheezing, rhonchi or rales.  Abdominal:     General: Abdomen is flat.     Tenderness: There is no abdominal tenderness.  Musculoskeletal:        General: Normal range of motion.     Cervical back: Normal range of motion and neck  supple. No tenderness.  Skin:    General: Skin is warm and dry.  Neurological:     General: No focal deficit present.     Mental Status: She is alert and oriented to person, place, and time.     Comments: Patient is oriented to person, place, time.  Distal sensation is intact.  Strength is 5 out of 5 in the bilateral upper and lower extremities.  Negative pronator  drift.  Upper extremities exhibit mild tremor when arms are extended.  Negative clonus test in the feet bilaterally.  2+ DTRs noted bilaterally.  Psychiatric:        Mood and Affect: Mood is anxious.        Behavior: Behavior normal.    ED Results / Procedures / Treatments   Labs (all labs ordered are listed, but only abnormal results are displayed) Labs Reviewed  COMPREHENSIVE METABOLIC PANEL - Abnormal; Notable for the following components:      Result Value   Potassium 3.3 (*)    Glucose, Bld 104 (*)    BUN 5 (*)    ALT 63 (*)    Total Bilirubin 1.3 (*)    All other components within normal limits  CBC  TROPONIN I (HIGH SENSITIVITY)  TROPONIN I (HIGH SENSITIVITY)   EKG EKG Interpretation  Date/Time:  Monday Jun 29 2019 12:29:11 EDT Ventricular Rate:  83 PR Interval:    QRS Duration: 76 QT Interval:  374 QTC Calculation: 440 R Axis:   64 Text Interpretation: Sinus rhythm Anteroseptal infarct, old Confirmed by Pattricia Boss [phone]) on 06/29/2019 3:12:41 PM   Radiology DG Chest Port 1 View  Result Date: 06/29/2019 CLINICAL DATA:  Chest pain and dizziness. EXAM: PORTABLE CHEST 1 VIEW COMPARISON:  None. FINDINGS: The heart size and mediastinal contours are within normal limits. Both lungs are clear. The visualized skeletal structures are unremarkable. IMPRESSION: Normal exam. Electronically Signed   By: Lorriane Shire M.D.   On: 06/29/2019 14:04   Procedures Procedures   Medications Ordered in ED Medications - No data to display  ED Course  I have reviewed the triage vital signs and the nursing  notes.  Pertinent labs & imaging results that were available during my care of the patient were reviewed by me and considered in my medical decision making (see chart for details).    MDM Rules/Calculators/A&P                      Patient is a pleasant 49 year old female with a history of hypertension and anxiety that presents to the emergency department with an episode of chest tightness and palpitations.  Her symptoms had mostly alleviated by the time she got to the emergency department.  Her physical exam was very reassuring.  She was slightly tremulous on exam but states she has a history of anxiety and felt anxious while she was here.  Her labs are mostly reassuring.  Her troponin is negative.  Her potassium slightly low at 3.3.  She takes hydrochlorothiazide and I discussed this with the patient and that this is likely what is causing her low potassium levels.  I recommended she supplement her potassium through her diet but follow-up with her primary care first thing in the morning to discuss this.  Additionally her ALT was slightly increased at 63.  Based on prior values this seems to be baseline for her.  She reports a history of steatosis.  Patient knows to follow-up with her primary care provider tomorrow morning regarding this visit to discuss not only her lab values but also her symptoms.  We discussed the possibility that this could be an arrhythmia such as atrial fibrillation with RVR.  She understands that her primary care provider may choose to send her to a cardiologist and may place her on a Holter monitor.  Her primary care provider may also check her thyroid levels as well.  She verbalized understanding of  the above plan and her questions were answered.  She was amicable at the time of discharge.  Her vital signs are stable.   Patient discharged to home/self care.  Condition at discharge: Stable  Note: Portions of this report may have been transcribed using voice recognition  software. Every effort was made to ensure accuracy; however, inadvertent computerized transcription errors may be present.    Final Clinical Impression(s) / ED Diagnoses Final diagnoses:  Feeling of chest tightness  Palpitations  Hypokalemia    Rx / DC Orders ED Discharge Orders    None       Rayna Sexton, PA-C 06/29/19 1715    Pattricia Boss, MD 07/01/19 1204

## 2019-06-29 NOTE — ED Triage Notes (Signed)
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

## 2019-06-29 NOTE — Discharge Instructions (Addendum)
Per our discussion, I would recommend the follow-up with your primary care provider regarding this visit.  Your labs were reassuring but you did have a slightly low potassium.  You are taking hydrochlorothiazide which is likely what is causing this.  In the short-term, I would recommend supplementing with your diet through things like bananas.  But your primary care provider can place you on a potassium supplement as well.  You can also discuss with them the possibility of seeing a cardiologist who can place you on a Holter monitor to see if you are experiencing palpitations or any type of arrhythmia.  Please do not hesitate to return to the emergency department with any new or worsening symptoms.  It was a pleasure to meet you.

## 2019-06-30 ENCOUNTER — Encounter: Payer: Self-pay | Admitting: Internal Medicine

## 2019-06-30 ENCOUNTER — Ambulatory Visit: Payer: 59 | Admitting: Internal Medicine

## 2019-06-30 VITALS — BP 142/86 | HR 91 | Temp 98.0°F | Resp 18 | Ht 62.0 in | Wt 219.2 lb

## 2019-06-30 DIAGNOSIS — F411 Generalized anxiety disorder: Secondary | ICD-10-CM

## 2019-06-30 DIAGNOSIS — R002 Palpitations: Secondary | ICD-10-CM

## 2019-06-30 DIAGNOSIS — I10 Essential (primary) hypertension: Secondary | ICD-10-CM | POA: Diagnosis not present

## 2019-06-30 MED ORDER — FLUOXETINE HCL 20 MG PO CAPS: 40.0000 mg | ORAL_CAPSULE | Freq: Every day | ORAL | 0 refills | Status: DC

## 2019-06-30 MED ORDER — ALPRAZOLAM 0.25 MG PO TABS: 0.2500 mg | ORAL_TABLET | Freq: Every day | ORAL | 0 refills | Status: DC | PRN

## 2019-06-30 NOTE — Progress Notes (Signed)
Pre visit review using our clinic review tool, if applicable. No additional management support is needed unless otherwise documented below in the visit note. 

## 2019-06-30 NOTE — Progress Notes (Signed)
Subjective:    Patient ID: Kristi Ramos, female    DOB: Mar 15, 1970, 49 y.o.   MRN: [id number]  DOS:  06/30/2019 Type of visit - description: ER follow-up Went to the ER yesterday: She was at work and suddenly developed palpitations, chest tightness, her wrist watch read a heart rate of 128, BP was elevated and the vision was blurry. EMS was called. At the time did not have diaphoresis, nausea or near fainting. In route to the ER she was already feeling better.  Work-up at the ER CBC, CMP and troponin were done, potassium was a slightly low, one of the LFTs was a slightly elevated but less so than before. EKG: NSR, no acute changes Chest x-ray normal  Today she is back to normal. On further questioning, she has been under a lot of stress for the last few months when she has been feeling very nervous. All is related to a marital issue, she has a court date tomorrow and that has increased her anxiety  more in the last few days. Denies shortness of breath or lower extremity edema No cough or wheezing Also, she wears her wrist watch daily and it has never alerted her that her heart rate is elevated like it was yesterday.  Review of Systems See above   Past Medical History:  Diagnosis Date  . ADD (attention deficit disorder)   . Anxiety   . Back pain   . Cervical cancer ([place]) 2010  . Chicken pox   . Depression 2009  . Gallbladder problem   . GERD (gastroesophageal reflux disease)   . HTN (hypertension)   . Seasonal allergies   . Uterine cancer ([place]) 2010    Past Surgical History:  Procedure Laterality Date  . ABDOMINAL HYSTERECTOMY  2010   TAH-- cancer  . ANKLE SURGERY Right    Osteomyelitis  . CHOLECYSTECTOMY    . LEG SURGERY     Calf Muscle Surgery    Allergies as of 06/30/2019      Reactions   Moxifloxacin Palpitations, Rash   Augmentin [amoxicillin-pot Clavulanate] Other (See Comments)   tachycardia      Medication List       Accurate as of Jun 30, 2019 11:59 PM. If you have any questions, ask your nurse or doctor.        ALPRAZolam 0.25 MG tablet Commonly known as: Xanax Take 1-2 tablets (0.25-0.5 mg total) by mouth daily as needed for anxiety. Started by: Kathlene November, MD   FLUoxetine 20 MG capsule Commonly known as: PROZAC Take 2 capsules (40 mg total) by mouth daily. What changed: how much to take Changed by: Kathlene November, MD   lisinopril-hydrochlorothiazide 20-25 MG tablet Commonly known as: ZESTORETIC TAKE 1 TABLET DAILY (DOSE INCREASE) What changed: See the new instructions.   omeprazole 20 MG capsule Commonly known as: PRILOSEC TAKE 1 CAPSULE DAILY   traZODone 50 MG tablet Commonly known as: DESYREL Take 1-2 tablets (50-100 mg total) by mouth at bedtime as needed. for sleep          Objective:   Physical Exam BP (!) 142/86 (BP Location: Left Arm, Patient Position: Sitting, Cuff Size: Normal)   Pulse 91   Temp 98 F (36.7 C) (Temporal)   Resp 18   Ht 5\' 2"  (1.575 m)   Wt 219 lb 4 oz (99.5 kg)   SpO2 99%   BMI 40.10 kg/m  General:   Well developed, NAD, BMI noted. HEENT:  Normocephalic .  Face symmetric, atraumatic Lungs:  CTA B Normal respiratory effort, no intercostal retractions, no accessory muscle use. Heart: RRR,  no murmur.  Lower extremities: no pretibial edema bilaterally  Skin: Not pale. Not jaundice Neurologic:  alert & oriented X3.  Speech normal, gait appropriate for age and unassisted Psych--  Cognition and judgment appear intact.  Cooperative with normal attention span and concentration.  Behavior appropriate. Slightly anxious but not depressed appearing.      Assessment    49 year old female, PMH includes HTN, moody d/t menopause (per pt) on prozac, h/o hysterectomy,oesity, increase LFTs, presents for ER follow-up  Palpitations:  Associated with other symptoms, as described above, work-up at the ER negative except for a minimally low potassium.  She is back to normal now. She  wears a wrist watch, it has not trigger an alarm regards  increased heart rate.  Last TSH 08/28/2018: Normal. This is most likely related to anxiety, we agreed on observation and follow-up with PCP in 3 weeks.   If she has more episodes she is to call the office.  She probably will need further work-up including a Holter, echo etc.  Anxiety: Related to marital issues, we agreed to increase fluoxetine to 40 mg and I Rx a  small amount of Xanax (was prescribed for her before, it helped)  HTN: BP today is okay, yesterday potassium was minimally low, no change.  Recheck BMP on RTC  RTC 3 weeks with PCP   This visit occurred during the SARS-CoV-2 public health emergency.  Safety protocols were in place, including screening questions prior to the visit, additional usage of staff PPE, and extensive cleaning of exam room while observing appropriate contact time as indicated for disinfecting solutions.

## 2019-06-30 NOTE — Patient Instructions (Addendum)
Try to eat potassium rich foods for the next few days  Increase fluoxetine to 40 mg daily  Take Xanax if you get very anxious, will cause drowsiness.  Be careful.  If you have again chest tightness, palpitations either call the office or go to the emergency room   [place], [place] back for a checkup with your primary doctor in 3 weeks     ? Nuts, such as peanuts and pistachios. ? Seeds, such as sunflower seeds and pumpkin seeds. ? Peas, lentils, and lima beans. ? Whole grain and bran cereals and breads. ? Fresh fruits and vegetables, such as apricots, avocado, bananas, cantaloupe, kiwi, oranges, tomatoes, asparagus, and potatoes. ? Orange juice. ? Tomato juice. ? Red meats.

## 2019-07-23 ENCOUNTER — Other Ambulatory Visit: Payer: Self-pay

## 2019-07-23 ENCOUNTER — Ambulatory Visit: Payer: 59 | Admitting: Family Medicine

## 2019-07-23 ENCOUNTER — Encounter: Payer: Self-pay | Admitting: Family Medicine

## 2019-07-23 VITALS — BP 130/88 | HR 83 | Temp 97.3°F | Resp 18 | Ht 62.0 in | Wt 222.6 lb

## 2019-07-23 DIAGNOSIS — F419 Anxiety disorder, unspecified: Secondary | ICD-10-CM

## 2019-07-23 DIAGNOSIS — F411 Generalized anxiety disorder: Secondary | ICD-10-CM | POA: Diagnosis not present

## 2019-07-23 DIAGNOSIS — G4700 Insomnia, unspecified: Secondary | ICD-10-CM

## 2019-07-23 DIAGNOSIS — E876 Hypokalemia: Secondary | ICD-10-CM | POA: Diagnosis not present

## 2019-07-23 MED ORDER — FLUOXETINE HCL 40 MG PO CAPS: 40.0000 mg | ORAL_CAPSULE | Freq: Every day | ORAL | 3 refills | Status: DC

## 2019-07-23 MED ORDER — ALPRAZOLAM 0.25 MG PO TABS: 0.2500 mg | ORAL_TABLET | Freq: Every day | ORAL | 0 refills | Status: DC | PRN

## 2019-07-23 MED ORDER — TRAZODONE HCL 50 MG PO TABS: 50.0000 mg | ORAL_TABLET | Freq: Every evening | ORAL | 2 refills | Status: DC | PRN

## 2019-07-23 NOTE — Patient Instructions (Addendum)

## 2019-07-23 NOTE — Progress Notes (Signed)
Patient ID: Kristi Ramos, female    DOB: 1970/11/25  Age: 49 y.o. MRN: [id number]    Subjective:  Subjective  HPI Asyia Cihlar presents for f/u anxiety -- she is doing much better   Review of Systems  Constitutional: Negative for fever.  HENT: Negative for congestion.   Respiratory: Negative for cough and shortness of breath.   Cardiovascular: Negative for chest pain, palpitations and leg swelling.  Gastrointestinal: Negative for vomiting.  Musculoskeletal: Negative for back pain.  Skin: Negative for rash.  Neurological: Negative for headaches.  Psychiatric/Behavioral: Positive for sleep disturbance. Negative for self-injury and suicidal ideas. The patient is nervous/anxious.     History Past Medical History:  Diagnosis Date  . ADD (attention deficit disorder)   . Anxiety   . Back pain   . Cervical cancer ([place]) 2010  . Chicken pox   . Depression 2009  . Gallbladder problem   . GERD (gastroesophageal reflux disease)   . HTN (hypertension)   . Seasonal allergies   . Uterine cancer ([place]) 2010    She has a past surgical history that includes Cholecystectomy; Ankle surgery (Right); Leg Surgery; and Abdominal hysterectomy (2010).   Her family history includes Alcohol abuse in her maternal grandfather and paternal grandmother; Anxiety disorder in her mother; Arthritis in her father and mother; Depression in her son; Drug abuse in her son; Heart attack in her paternal grandfather; Heart attack (age of onset: 73) in her father; Heart disease in her father and paternal grandfather; Hyperlipidemia in her father; Hypertension in her brother and mother; Stroke in her mother; Thyroid disease in her mother.She reports that she has never smoked. She has never used smokeless tobacco. She reports current alcohol use. She reports that she does not use drugs.  Current Outpatient Medications on File Prior to Visit  Medication Sig Dispense Refill  . lisinopril-hydrochlorothiazide  (ZESTORETIC) 20-25 MG tablet TAKE 1 TABLET DAILY (DOSE INCREASE) (Patient taking differently: Take 1 tablet by mouth daily. ) 90 tablet 3  . omeprazole (PRILOSEC) 20 MG capsule TAKE 1 CAPSULE DAILY (Patient taking differently: Take 20 mg by mouth daily. ) 90 capsule 3   No current facility-administered medications on file prior to visit.     Objective:  Objective  Physical Exam Vitals and nursing note reviewed.  Constitutional:      Appearance: She is well-developed.  HENT:     Head: Normocephalic and atraumatic.  Eyes:     Conjunctiva/sclera: Conjunctivae normal.  Neck:     Thyroid: No thyromegaly.     Vascular: No carotid bruit or JVD.  Cardiovascular:     Rate and Rhythm: Normal rate and regular rhythm.     Heart sounds: Normal heart sounds. No murmur.  Pulmonary:     Effort: Pulmonary effort is normal. No respiratory distress.     Breath sounds: Normal breath sounds. No wheezing or rales.  Chest:     Chest wall: No tenderness.  Musculoskeletal:     Cervical back: Normal range of motion and neck supple.  Neurological:     Mental Status: She is alert and oriented to person, place, and time.  Psychiatric:        Attention and Perception: Attention and perception normal.        Mood and Affect: Mood and affect normal.        Speech: Speech normal.        Behavior: Behavior normal.        Thought Content: Thought content  normal.        Cognition and Memory: Cognition normal.    BP 130/88 (BP Location: Left Arm, Patient Position: Sitting, Cuff Size: Large)   Pulse 83   Temp (!) 97.3 F (36.3 C) (Temporal)   Resp 18   Ht 5\' 2"  (1.575 m)   Wt 222 lb 9.6 oz (101 kg)   SpO2 97%   BMI 40.71 kg/m  Wt Readings from Last 3 Encounters:  07/23/19 222 lb 9.6 oz (101 kg)  06/30/19 219 lb 4 oz (99.5 kg)  03/05/19 234 lb 12.8 oz (106.5 kg)     Lab Results  Component Value Date   WBC 10.1 06/29/2019   HGB 14.2 06/29/2019   HCT 41.0 06/29/2019   PLT 352 06/29/2019    GLUCOSE 104 (H) 06/29/2019   CHOL 159 08/28/2018   TRIG 64.0 08/28/2018   HDL 63.90 08/28/2018   LDLCALC 83 08/28/2018   ALT 63 (H) 06/29/2019   AST 35 06/29/2019   NA 138 06/29/2019   K 3.3 (L) 06/29/2019   CL 100 06/29/2019   CREATININE 0.64 06/29/2019   BUN 5 (L) 06/29/2019   CO2 24 06/29/2019   TSH 1.63 08/28/2018   HGBA1C 5.9 08/08/2017    DG Chest Port 1 View  Result Date: 06/29/2019 CLINICAL DATA:  Chest pain and dizziness. EXAM: PORTABLE CHEST 1 VIEW COMPARISON:  None. FINDINGS: The heart size and mediastinal contours are within normal limits. Both lungs are clear. The visualized skeletal structures are unremarkable. IMPRESSION: Normal exam. Electronically Signed   By: Lorriane Shire M.D.   On: 06/29/2019 14:04     Assessment & Plan:  Plan  I have discontinued Maragret B. Serpas's FLUoxetine. I am also having her start on FLUoxetine. Additionally, I am having her maintain her lisinopril-hydrochlorothiazide, omeprazole, traZODone, and ALPRAZolam.  Meds ordered this encounter  Medications  . traZODone (DESYREL) 50 MG tablet    Sig: Take 1-2 tablets (50-100 mg total) by mouth at bedtime as needed. for sleep    Dispense:  60 tablet    Refill:  2  . FLUoxetine (PROZAC) 40 MG capsule    Sig: Take 1 capsule (40 mg total) by mouth daily.    Dispense:  90 capsule    Refill:  3  . ALPRAZolam (XANAX) 0.25 MG tablet    Sig: Take 1-2 tablets (0.25-0.5 mg total) by mouth daily as needed for anxiety.    Dispense:  20 tablet    Refill:  0    Problem List Items Addressed This Visit      Unprioritized   Anxiety state    Improved but she is still having some palpitations Will inc prozac to 40 mg       Relevant Medications   traZODone (DESYREL) 50 MG tablet   FLUoxetine (PROZAC) 40 MG capsule   ALPRAZolam (XANAX) 0.25 MG tablet   Insomnia   Relevant Medications   traZODone (DESYREL) 50 MG tablet    Other Visit Diagnoses    Anxiety    -  Primary   Relevant Medications    traZODone (DESYREL) 50 MG tablet   FLUoxetine (PROZAC) 40 MG capsule   ALPRAZolam (XANAX) 0.25 MG tablet   Hypokalemia       Relevant Orders   Basic metabolic panel      Follow-up: Return in about 3 months (around 10/23/2019), or if symptoms worsen or fail to improve, for anxiety.  Ann Held, DO

## 2019-07-27 NOTE — Assessment & Plan Note (Addendum)
Improved but she is still having some palpitations Will inc prozac to 40 mg  F/u 3 months or sooner prn

## 2019-08-25 ENCOUNTER — Other Ambulatory Visit: Payer: Self-pay | Admitting: Family Medicine

## 2019-08-25 DIAGNOSIS — F419 Anxiety disorder, unspecified: Secondary | ICD-10-CM

## 2019-08-25 DIAGNOSIS — Z79899 Other long term (current) drug therapy: Secondary | ICD-10-CM

## 2019-08-25 NOTE — Telephone Encounter (Signed)
Requesting: xanax Contract:n/a UDS:n/a Last Visit:07/23/19 Next Visit:10/29/19 Last Refill:07/23/19  Please Advise

## 2019-08-26 MED ORDER — ALPRAZOLAM 0.25 MG PO TABS: 0.2500 mg | ORAL_TABLET | Freq: Every day | ORAL | 0 refills | Status: DC | PRN

## 2019-08-26 NOTE — Telephone Encounter (Signed)
I will refill but she needs uds and contract

## 2019-08-26 NOTE — Addendum Note (Signed)
Addended by: Jeronimo Greaves on: 08/26/2019 09:47 AM   Modules accepted: Orders

## 2019-08-26 NOTE — Telephone Encounter (Signed)
Patient scheduled for UDS on 7/16. I will drop the order and place the contract at the front desk.

## 2019-09-11 ENCOUNTER — Other Ambulatory Visit: Payer: Self-pay

## 2019-09-11 ENCOUNTER — Other Ambulatory Visit: Payer: 59

## 2019-09-11 DIAGNOSIS — Z79899 Other long term (current) drug therapy: Secondary | ICD-10-CM

## 2019-09-11 DIAGNOSIS — F419 Anxiety disorder, unspecified: Secondary | ICD-10-CM

## 2019-09-13 LAB — DRUG MONITORING, PANEL 8 WITH CONFIRMATION, URINE
6 Acetylmorphine: NEGATIVE ng/mL (ref <10)
Alcohol Metabolites: POSITIVE ng/mL — AB
Alphahydroxyalprazolam: 37 ng/mL — ABNORMAL HIGH (ref <25)
Alphahydroxymidazolam: NEGATIVE ng/mL (ref <50)
Alphahydroxytriazolam: NEGATIVE ng/mL (ref <50)
Aminoclonazepam: NEGATIVE ng/mL (ref <25)
Amphetamines: NEGATIVE ng/mL (ref <500)
Benzodiazepines: POSITIVE ng/mL — AB (ref <100)
Buprenorphine, Urine: NEGATIVE ng/mL (ref <5)
Cocaine Metabolite: NEGATIVE ng/mL (ref <150)
Creatinine: 151 mg/dL
Ethyl Glucuronide (ETG): >100000 ng/mL — ABNORMAL HIGH (ref <500)
Ethyl Sulfate (ETS): 22977 ng/mL — ABNORMAL HIGH (ref <100)
Hydroxyethylflurazepam: NEGATIVE ng/mL (ref <50)
Lorazepam: NEGATIVE ng/mL (ref <50)
MDMA: NEGATIVE ng/mL (ref <500)
Marijuana Metabolite: NEGATIVE ng/mL (ref <20)
Nordiazepam: NEGATIVE ng/mL (ref <50)
Opiates: NEGATIVE ng/mL (ref <100)
Oxazepam: NEGATIVE ng/mL (ref <50)
Oxidant: NEGATIVE ug/mL
Oxycodone: NEGATIVE ng/mL (ref <100)
Temazepam: NEGATIVE ng/mL (ref <50)
pH: 5.3 (ref 4.5–9.0)

## 2019-09-13 LAB — DM TEMPLATE

## 2019-09-14 ENCOUNTER — Encounter: Payer: Self-pay | Admitting: Family Medicine

## 2019-09-28 ENCOUNTER — Other Ambulatory Visit: Payer: Self-pay | Admitting: Family Medicine

## 2019-09-28 DIAGNOSIS — F419 Anxiety disorder, unspecified: Secondary | ICD-10-CM

## 2019-09-28 MED ORDER — ALPRAZOLAM 0.25 MG PO TABS: 0.2500 mg | ORAL_TABLET | Freq: Every day | ORAL | 0 refills | Status: DC | PRN

## 2019-09-28 NOTE — Telephone Encounter (Signed)
Alprazolam refill.   Last OV: 07/23/2019 Last Fill: 08/26/2019 #20 and 0RF Pt sig: 1-2 tab daily prn UDS: 09/11/2019 +alcohol

## 2019-10-19 ENCOUNTER — Telehealth: Payer: Self-pay | Admitting: Family Medicine

## 2019-10-19 DIAGNOSIS — F419 Anxiety disorder, unspecified: Secondary | ICD-10-CM

## 2019-10-19 MED ORDER — FLUOXETINE HCL 40 MG PO CAPS: 40.0000 mg | ORAL_CAPSULE | Freq: Every day | ORAL | 0 refills | Status: DC

## 2019-10-19 MED ORDER — FLUOXETINE HCL 40 MG PO CAPS: 40.0000 mg | ORAL_CAPSULE | Freq: Every day | ORAL | 2 refills | Status: DC

## 2019-10-19 NOTE — Telephone Encounter (Signed)
Medication: FLUoxetine (PROZAC) 40 MG capsule  Has the patient contacted their pharmacy? No. (If no, request that the patient contact the pharmacy for the refill.) (If yes, when and what did the pharmacy advise?)  Preferred Pharmacy (with phone number or street name): [address], [place], [place] [address] Senior hours: [place]  More hours Phone: [phone]  Agent: Please be advised that RX refills may take up to 3 business days. We ask that you follow-up with your pharmacy.   Send 30 day supply and rest to mail order  Patient states she take 2 pills total on 40mg  a day prescription updated

## 2019-10-19 NOTE — Telephone Encounter (Signed)
Refills sent to both locations

## 2019-10-20 ENCOUNTER — Encounter: Payer: Self-pay | Admitting: Family Medicine

## 2019-10-20 DIAGNOSIS — F419 Anxiety disorder, unspecified: Secondary | ICD-10-CM

## 2019-10-20 MED ORDER — FLUOXETINE HCL 40 MG PO CAPS: 40.0000 mg | ORAL_CAPSULE | Freq: Every day | ORAL | 0 refills | Status: DC

## 2019-10-29 ENCOUNTER — Encounter: Payer: Self-pay | Admitting: Family Medicine

## 2019-10-29 ENCOUNTER — Other Ambulatory Visit (HOSPITAL_COMMUNITY)
Admission: RE | Admit: 2019-10-29 | Discharge: 2019-10-29 | Disposition: A | Discharge status: Home / Self Care | Payer: 59 | Source: Ambulatory Visit | Attending: Family Medicine | Admitting: Family Medicine

## 2019-10-29 ENCOUNTER — Ambulatory Visit: Payer: 59 | Admitting: Family Medicine

## 2019-10-29 ENCOUNTER — Other Ambulatory Visit: Payer: Self-pay

## 2019-10-29 VITALS — BP 140/100 | HR 72 | Temp 98.9°F | Resp 18 | Ht 62.0 in | Wt 211.4 lb

## 2019-10-29 DIAGNOSIS — W57XXXA Bitten or stung by nonvenomous insect and other nonvenomous arthropods, initial encounter: Secondary | ICD-10-CM

## 2019-10-29 DIAGNOSIS — Z7251 High risk heterosexual behavior: Secondary | ICD-10-CM

## 2019-10-29 DIAGNOSIS — S40862A Insect bite (nonvenomous) of left upper arm, initial encounter: Secondary | ICD-10-CM | POA: Diagnosis not present

## 2019-10-29 DIAGNOSIS — F419 Anxiety disorder, unspecified: Secondary | ICD-10-CM | POA: Diagnosis not present

## 2019-10-29 DIAGNOSIS — I10 Essential (primary) hypertension: Secondary | ICD-10-CM

## 2019-10-29 DIAGNOSIS — F411 Generalized anxiety disorder: Secondary | ICD-10-CM

## 2019-10-29 LAB — POC URINALSYSI DIPSTICK (AUTOMATED)
Bilirubin, UA: NEGATIVE
Blood, UA: NEGATIVE
Glucose, UA: NEGATIVE
Ketones, UA: NEGATIVE
Leukocytes, UA: NEGATIVE
Nitrite, UA: NEGATIVE
Protein, UA: NEGATIVE
Spec Grav, UA: 1.01 (ref 1.010–1.025)
Urobilinogen, UA: 0.2 E.U./dL
pH, UA: 5.5 (ref 5.0–8.0)

## 2019-10-29 MED ORDER — ALPRAZOLAM 0.25 MG PO TABS: 0.2500 mg | ORAL_TABLET | Freq: Every day | ORAL | 0 refills | Status: DC | PRN

## 2019-10-29 NOTE — Assessment & Plan Note (Signed)
Better now that she has a restraining order and is thinking about moving back to her hometown

## 2019-10-29 NOTE — Addendum Note (Signed)
Addended by: Kem Boroughs D on: 10/29/2019 04:20 PM   Modules accepted: Orders

## 2019-10-29 NOTE — Patient Instructions (Signed)
DASH Eating Plan DASH stands for "Dietary Approaches to Stop Hypertension." The DASH eating plan is a healthy eating plan that has been shown to reduce high blood pressure (hypertension). It may also reduce your risk for type 2 diabetes, heart disease, and stroke. The DASH eating plan may also help with weight loss. What are tips for following this plan?  General guidelines  Avoid eating more than 2,300 mg (milligrams) of salt (sodium) a day. If you have hypertension, you may need to reduce your sodium intake to 1,500 mg a day.  Limit alcohol intake to no more than 1 drink a day for nonpregnant women and 2 drinks a day for men. One drink equals 12 oz of beer, 5 oz of wine, or 1 oz of hard liquor.  Work with your health care provider to maintain a healthy body weight or to lose weight. Ask what an ideal weight is for you.  Get at least 30 minutes of exercise that causes your heart to beat faster (aerobic exercise) most days of the week. Activities may include walking, swimming, or biking.  Work with your health care provider or diet and nutrition specialist (dietitian) to adjust your eating plan to your individual calorie needs. Reading food labels   Check food labels for the amount of sodium per serving. Choose foods with less than 5 percent of the Daily Value of sodium. Generally, foods with less than 300 mg of sodium per serving fit into this eating plan.  To find whole grains, look for the word "whole" as the first word in the ingredient list. Shopping  Buy products labeled as "low-sodium" or "no salt added."  Buy fresh foods. Avoid canned foods and premade or frozen meals. Cooking  Avoid adding salt when cooking. Use salt-free seasonings or herbs instead of table salt or sea salt. Check with your health care provider or pharmacist before using salt substitutes.  Do not fry foods. Cook foods using healthy methods such as baking, boiling, grilling, and broiling instead.  Cook with  heart-healthy oils, such as olive, canola, soybean, or sunflower oil. Meal planning  Eat a balanced diet that includes: ? 5 or more servings of fruits and vegetables each day. At each meal, try to fill half of your plate with fruits and vegetables. ? Up to 6-8 servings of whole grains each day. ? Less than 6 oz of lean meat, poultry, or fish each day. A 3-oz serving of meat is about the same size as a deck of cards. One egg equals 1 oz. ? 2 servings of low-fat dairy each day. ? A serving of nuts, seeds, or beans 5 times each week. ? Heart-healthy fats. Healthy fats called Omega-3 fatty acids are found in foods such as flaxseeds and coldwater fish, like sardines, salmon, and mackerel.  Limit how much you eat of the following: ? Canned or prepackaged foods. ? Food that is high in trans fat, such as fried foods. ? Food that is high in saturated fat, such as fatty meat. ? Sweets, desserts, sugary drinks, and other foods with added sugar. ? Full-fat dairy products.  Do not salt foods before eating.  Try to eat at least 2 vegetarian meals each week.  Eat more home-cooked food and less restaurant, buffet, and fast food.  When eating at a restaurant, ask that your food be prepared with less salt or no salt, if possible. What foods are recommended? The items listed may not be a complete list. Talk with your dietitian about   what dietary choices are best for you. Grains Whole-grain or whole-wheat bread. Whole-grain or whole-wheat pasta. Brown rice. Oatmeal. Quinoa. Bulgur. Whole-grain and low-sodium cereals. Pita bread. Low-fat, low-sodium crackers. Whole-wheat flour tortillas. Vegetables Fresh or frozen vegetables (raw, steamed, roasted, or grilled). Low-sodium or reduced-sodium tomato and vegetable juice. Low-sodium or reduced-sodium tomato sauce and tomato paste. Low-sodium or reduced-sodium canned vegetables. Fruits All fresh, dried, or frozen fruit. Canned fruit in natural juice (without  added sugar). Meat and other protein foods Skinless chicken or turkey. Ground chicken or turkey. Pork with fat trimmed off. Fish and seafood. Egg whites. Dried beans, peas, or lentils. Unsalted nuts, nut butters, and seeds. Unsalted canned beans. Lean cuts of beef with fat trimmed off. Low-sodium, lean deli meat. Dairy Low-fat (1%) or fat-free (skim) milk. Fat-free, low-fat, or reduced-fat cheeses. Nonfat, low-sodium ricotta or cottage cheese. Low-fat or nonfat yogurt. Low-fat, low-sodium cheese. Fats and oils Soft margarine without trans fats. Vegetable oil. Low-fat, reduced-fat, or light mayonnaise and salad dressings (reduced-sodium). Canola, safflower, olive, soybean, and sunflower oils. Avocado. Seasoning and other foods Herbs. Spices. Seasoning mixes without salt. Unsalted popcorn and pretzels. Fat-free sweets. What foods are not recommended? The items listed may not be a complete list. Talk with your dietitian about what dietary choices are best for you. Grains Baked goods made with fat, such as croissants, muffins, or some breads. Dry pasta or rice meal packs. Vegetables Creamed or fried vegetables. Vegetables in a cheese sauce. Regular canned vegetables (not low-sodium or reduced-sodium). Regular canned tomato sauce and paste (not low-sodium or reduced-sodium). Regular tomato and vegetable juice (not low-sodium or reduced-sodium). Pickles. Olives. Fruits Canned fruit in a light or heavy syrup. Fried fruit. Fruit in cream or butter sauce. Meat and other protein foods Fatty cuts of meat. Ribs. Fried meat. Bacon. Sausage. Bologna and other processed lunch meats. Salami. Fatback. Hotdogs. Bratwurst. Salted nuts and seeds. Canned beans with added salt. Canned or smoked fish. Whole eggs or egg yolks. Chicken or turkey with skin. Dairy Whole or 2% milk, cream, and half-and-half. Whole or full-fat cream cheese. Whole-fat or sweetened yogurt. Full-fat cheese. Nondairy creamers. Whipped toppings.  Processed cheese and cheese spreads. Fats and oils Butter. Stick margarine. Lard. Shortening. Ghee. Bacon fat. Tropical oils, such as coconut, palm kernel, or palm oil. Seasoning and other foods Salted popcorn and pretzels. Onion salt, garlic salt, seasoned salt, table salt, and sea salt. Worcestershire sauce. Tartar sauce. Barbecue sauce. Teriyaki sauce. Soy sauce, including reduced-sodium. Steak sauce. Canned and packaged gravies. Fish sauce. Oyster sauce. Cocktail sauce. Horseradish that you find on the shelf. Ketchup. Mustard. Meat flavorings and tenderizers. Bouillon cubes. Hot sauce and Tabasco sauce. Premade or packaged marinades. Premade or packaged taco seasonings. Relishes. Regular salad dressings. Where to find more information:  National Heart, Lung, and Blood Institute: www.nhlbi.nih.gov  American Heart Association: www.heart.org Summary  The DASH eating plan is a healthy eating plan that has been shown to reduce high blood pressure (hypertension). It may also reduce your risk for type 2 diabetes, heart disease, and stroke.  With the DASH eating plan, you should limit salt (sodium) intake to 2,300 mg a day. If you have hypertension, you may need to reduce your sodium intake to 1,500 mg a day.  When on the DASH eating plan, aim to eat more fresh fruits and vegetables, whole grains, lean proteins, low-fat dairy, and heart-healthy fats.  Work with your health care provider or diet and nutrition specialist (dietitian) to adjust your eating plan to your   individual calorie needs. This information is not intended to replace advice given to you by your health care provider. Make sure you discuss any questions you have with your health care provider. Document Revised: 01/25/2017 Document Reviewed: 02/06/2016 Elsevier Patient Education  2020 Elsevier Inc.  

## 2019-10-29 NOTE — Progress Notes (Signed)
Patient ID: Kristi Ramos, female    DOB: 27-Apr-1970  Age: 49 y.o. MRN: [id number]    Subjective:  Subjective  HPI Kristi Ramos presents for f/u anxiety and bp.   She had high salt meal this am  She would alos like std check --- she had unprotected sex with a new partner   No symptoms  Review of Systems  Constitutional: Negative for appetite change, diaphoresis, fatigue and unexpected weight change.  Eyes: Negative for pain, redness and visual disturbance.  Respiratory: Negative for cough, chest tightness, shortness of breath and wheezing.   Cardiovascular: Negative for chest pain, palpitations and leg swelling.  Endocrine: Negative for cold intolerance, heat intolerance, polydipsia, polyphagia and polyuria.  Genitourinary: Negative for difficulty urinating, dysuria and frequency.  Neurological: Negative for dizziness, light-headedness, numbness and headaches.    History Past Medical History:  Diagnosis Date  . ADD (attention deficit disorder)   . Anxiety   . Back pain   . Cervical cancer ([place]) 2010  . Chicken pox   . Depression 2009  . Gallbladder problem   . GERD (gastroesophageal reflux disease)   . HTN (hypertension)   . Seasonal allergies   . Uterine cancer ([place]) 2010    She has a past surgical history that includes Cholecystectomy; Ankle surgery (Right); Leg Surgery; and Abdominal hysterectomy (2010).   Her family history includes Alcohol abuse in her maternal grandfather and paternal grandmother; Anxiety disorder in her mother; Arthritis in her father and mother; Depression in her son; Drug abuse in her son; Heart attack in her paternal grandfather; Heart attack (age of onset: 44) in her father; Heart disease in her father and paternal grandfather; Hyperlipidemia in her father; Hypertension in her brother and mother; Stroke in her mother; Thyroid disease in her mother.She reports that she has never smoked. She has never used smokeless tobacco. She reports  current alcohol use. She reports that she does not use drugs.  Current Outpatient Medications on File Prior to Visit  Medication Sig Dispense Refill  . FLUoxetine (PROZAC) 40 MG capsule Take 1 capsule (40 mg total) by mouth daily. 15 capsule 0  . lisinopril-hydrochlorothiazide (ZESTORETIC) 20-25 MG tablet TAKE 1 TABLET DAILY (DOSE INCREASE) (Patient taking differently: Take 1 tablet by mouth daily. ) 90 tablet 3  . omeprazole (PRILOSEC) 20 MG capsule TAKE 1 CAPSULE DAILY (Patient taking differently: Take 20 mg by mouth daily. ) 90 capsule 3  . traZODone (DESYREL) 50 MG tablet Take 1-2 tablets (50-100 mg total) by mouth at bedtime as needed. for sleep 60 tablet 2   No current facility-administered medications on file prior to visit.     Objective:  Objective  Physical Exam Vitals and nursing note reviewed.  Constitutional:      Appearance: She is well-developed.  HENT:     Head: Normocephalic and atraumatic.  Eyes:     Conjunctiva/sclera: Conjunctivae normal.  Neck:     Thyroid: No thyromegaly.     Vascular: No carotid bruit or JVD.  Cardiovascular:     Rate and Rhythm: Normal rate and regular rhythm.     Heart sounds: Normal heart sounds. No murmur heard.   Pulmonary:     Effort: Pulmonary effort is normal. No respiratory distress.     Breath sounds: Normal breath sounds. No wheezing or rales.  Chest:     Chest wall: No tenderness.  Genitourinary:    Comments: Pt did self swab for gen probe  Musculoskeletal:     Cervical  back: Normal range of motion and neck supple.  Skin:    Findings: Lesion present.       Neurological:     Mental Status: She is alert and oriented to person, place, and time.    BP (!) 140/100 (BP Location: Left Arm, Patient Position: Sitting, Cuff Size: Normal)   Pulse 72   Temp 98.9 F (37.2 C) (Oral)   Resp 18   Ht 5\' 2"  (1.575 m)   Wt 211 lb 6.4 oz (95.9 kg)   SpO2 97%   BMI 38.67 kg/m  Wt Readings from Last 3 Encounters:  10/29/19 211 lb  6.4 oz (95.9 kg)  07/23/19 222 lb 9.6 oz (101 kg)  06/30/19 219 lb 4 oz (99.5 kg)     Lab Results  Component Value Date   WBC 10.1 06/29/2019   HGB 14.2 06/29/2019   HCT 41.0 06/29/2019   PLT 352 06/29/2019   GLUCOSE 104 (H) 06/29/2019   CHOL 159 08/28/2018   TRIG 64.0 08/28/2018   HDL 63.90 08/28/2018   LDLCALC 83 08/28/2018   ALT 63 (H) 06/29/2019   AST 35 06/29/2019   NA 138 06/29/2019   K 3.3 (L) 06/29/2019   CL 100 06/29/2019   CREATININE 0.64 06/29/2019   BUN 5 (L) 06/29/2019   CO2 24 06/29/2019   TSH 1.63 08/28/2018   HGBA1C 5.9 08/08/2017    DG Chest Port 1 View  Result Date: 06/29/2019 CLINICAL DATA:  Chest pain and dizziness. EXAM: PORTABLE CHEST 1 VIEW COMPARISON:  None. FINDINGS: The heart size and mediastinal contours are within normal limits. Both lungs are clear. The visualized skeletal structures are unremarkable. IMPRESSION: Normal exam. Electronically Signed   By: Lorriane Shire M.D.   On: 06/29/2019 14:04     Assessment & Plan:  Plan  I am having Kristi Ramos maintain her lisinopril-hydrochlorothiazide, omeprazole, traZODone, FLUoxetine, and ALPRAZolam.  Meds ordered this encounter  Medications  . ALPRAZolam (XANAX) 0.25 MG tablet    Sig: Take 1-2 tablets (0.25-0.5 mg total) by mouth daily as needed for anxiety.    Dispense:  20 tablet    Refill:  0    Problem List Items Addressed This Visit      Unprioritized   Anxiety state    Better now that she has a restraining order and is thinking about moving back to her hometown       Relevant Medications   ALPRAZolam (XANAX) 0.25 MG tablet   Essential hypertension    .dash diet  con't meds f/u 3 months      Relevant Orders   Lipid panel   Comprehensive metabolic panel    Other Visit Diagnoses    Tick bite of axillary region, left, initial encounter    -  Primary   Relevant Orders   B. burgdorfi antibodies   Rocky mtn spotted fvr abs pnl(IgG+IgM)   Anxiety       Relevant Medications    ALPRAZolam (XANAX) 0.25 MG tablet   High risk heterosexual behavior       Relevant Orders   HIV Antibody (routine testing w rflx)   RPR   HSV(herpes simplex vrs) 1+2 ab-IgG   Cervicovaginal ancillary only( [place])      Follow-up: Return in about 3 months (around 01/28/2020), or if symptoms worsen or fail to improve, for hypertension.  Ann Held, DO

## 2019-10-29 NOTE — Assessment & Plan Note (Signed)
.  dash diet  con't meds f/u 3 months

## 2019-11-01 ENCOUNTER — Other Ambulatory Visit: Payer: Self-pay | Admitting: Family Medicine

## 2019-11-01 DIAGNOSIS — I10 Essential (primary) hypertension: Secondary | ICD-10-CM

## 2019-11-03 LAB — LIPID PANEL
Cholesterol: 185 mg/dL (ref <200)
HDL: 80 mg/dL (ref >=50)
LDL Cholesterol (Calc): 90 mg/dL (calc)
Non-HDL Cholesterol (Calc): 105 mg/dL (calc) (ref <130)
Total CHOL/HDL Ratio: 2.3 (calc) (ref <5.0)
Triglycerides: 64 mg/dL (ref <150)

## 2019-11-03 LAB — COMPREHENSIVE METABOLIC PANEL
AG Ratio: 1.8 (calc) (ref 1.0–2.5)
ALT: 97 U/L — ABNORMAL HIGH (ref 6–29)
AST: 62 U/L — ABNORMAL HIGH (ref 10–35)
Albumin: 4.4 g/dL (ref 3.6–5.1)
Alkaline phosphatase (APISO): 74 U/L (ref 31–125)
BUN: 16 mg/dL (ref 7–25)
CO2: 23 mmol/L (ref 20–32)
Calcium: 8.8 mg/dL (ref 8.6–10.2)
Chloride: 100 mmol/L (ref 98–110)
Creat: 0.75 mg/dL (ref 0.50–1.10)
Globulin: 2.5 g/dL (calc) (ref 1.9–3.7)
Glucose, Bld: 100 mg/dL — ABNORMAL HIGH (ref 65–99)
Potassium: 3.9 mmol/L (ref 3.5–5.3)
Sodium: 136 mmol/L (ref 135–146)
Total Bilirubin: 0.5 mg/dL (ref 0.2–1.2)
Total Protein: 6.9 g/dL (ref 6.1–8.1)

## 2019-11-03 LAB — CERVICOVAGINAL ANCILLARY ONLY
Bacterial Vaginitis (gardnerella): NEGATIVE
Candida Glabrata: NEGATIVE
Candida Vaginitis: NEGATIVE
Chlamydia: NEGATIVE
Comment: NEGATIVE
Comment: NEGATIVE
Comment: NEGATIVE
Comment: NEGATIVE
Comment: NEGATIVE
Comment: NORMAL
Neisseria Gonorrhea: NEGATIVE
Trichomonas: NEGATIVE

## 2019-11-03 LAB — B. BURGDORFI ANTIBODIES: B burgdorferi Ab IgG+IgM: <0.9 index

## 2019-11-03 LAB — ROCKY MTN SPOTTED FVR ABS PNL(IGG+IGM)
RMSF IgG: NOT DETECTED
RMSF IgM: NOT DETECTED

## 2019-11-03 LAB — HIV ANTIBODY (ROUTINE TESTING W REFLEX): HIV 1&2 Ab, 4th Generation: NONREACTIVE

## 2019-11-03 LAB — RPR: RPR Ser Ql: NONREACTIVE

## 2019-11-03 LAB — HSV(HERPES SIMPLEX VRS) I + II AB-IGG
HAV 1 IGG,TYPE SPECIFIC AB: <0.9 index
HSV 2 IGG,TYPE SPECIFIC AB: <0.9 index

## 2019-11-16 ENCOUNTER — Encounter (HOSPITAL_COMMUNITY): Payer: Self-pay

## 2019-11-16 ENCOUNTER — Emergency Department (HOSPITAL_COMMUNITY)
Admission: EM | Admit: 2019-11-16 | Discharge: 2019-11-17 | Disposition: A | Discharge status: Other Acute Inpatient Hospital | Payer: 59 | Attending: Emergency Medicine | Admitting: Emergency Medicine

## 2019-11-16 ENCOUNTER — Other Ambulatory Visit: Payer: Self-pay

## 2019-11-16 DIAGNOSIS — Z87898 Personal history of other specified conditions: Secondary | ICD-10-CM

## 2019-11-16 DIAGNOSIS — Z79899 Other long term (current) drug therapy: Secondary | ICD-10-CM | POA: Diagnosis not present

## 2019-11-16 DIAGNOSIS — Z91410 Personal history of adult physical and sexual abuse: Secondary | ICD-10-CM | POA: Insufficient documentation

## 2019-11-16 DIAGNOSIS — R45851 Suicidal ideations: Secondary | ICD-10-CM | POA: Diagnosis not present

## 2019-11-16 DIAGNOSIS — Z20822 Contact with and (suspected) exposure to covid-19: Secondary | ICD-10-CM | POA: Diagnosis not present

## 2019-11-16 DIAGNOSIS — F430 Acute stress reaction: Secondary | ICD-10-CM | POA: Insufficient documentation

## 2019-11-16 DIAGNOSIS — I10 Essential (primary) hypertension: Secondary | ICD-10-CM | POA: Insufficient documentation

## 2019-11-16 LAB — COMPREHENSIVE METABOLIC PANEL
ALT: 110 U/L — ABNORMAL HIGH (ref 0–44)
AST: 77 U/L — ABNORMAL HIGH (ref 15–41)
Albumin: 4.8 g/dL (ref 3.5–5.0)
Alkaline Phosphatase: 84 U/L (ref 38–126)
Anion gap: 21 — ABNORMAL HIGH (ref 5–15)
BUN: 11 mg/dL (ref 6–20)
CO2: 20 mmol/L — ABNORMAL LOW (ref 22–32)
Calcium: 8.9 mg/dL (ref 8.9–10.3)
Chloride: 97 mmol/L — ABNORMAL LOW (ref 98–111)
Creatinine, Ser: 0.72 mg/dL (ref 0.44–1.00)
GFR calc Af Amer: >60 mL/min (ref >60)
GFR calc non Af Amer: >60 mL/min (ref >60)
Glucose, Bld: 107 mg/dL — ABNORMAL HIGH (ref 70–99)
Potassium: 3.9 mmol/L (ref 3.5–5.1)
Sodium: 138 mmol/L (ref 135–145)
Total Bilirubin: 0.6 mg/dL (ref 0.3–1.2)
Total Protein: 8 g/dL (ref 6.5–8.1)

## 2019-11-16 LAB — CBC WITH DIFFERENTIAL/PLATELET
Abs Immature Granulocytes: 0.02 10*3/uL (ref 0.00–0.07)
Basophils Absolute: 0.1 10*3/uL (ref 0.0–0.1)
Basophils Relative: 1 %
Eosinophils Absolute: 0 10*3/uL (ref 0.0–0.5)
Eosinophils Relative: 0 %
HCT: 46.1 % — ABNORMAL HIGH (ref 36.0–46.0)
Hemoglobin: 15.7 g/dL — ABNORMAL HIGH (ref 12.0–15.0)
Immature Granulocytes: 0 %
Lymphocytes Relative: 19 %
Lymphs Abs: 1.5 10*3/uL (ref 0.7–4.0)
MCH: 31.7 pg (ref 26.0–34.0)
MCHC: 34.1 g/dL (ref 30.0–36.0)
MCV: 92.9 fL (ref 80.0–100.0)
Monocytes Absolute: 0.6 10*3/uL (ref 0.1–1.0)
Monocytes Relative: 8 %
Neutro Abs: 5.7 10*3/uL (ref 1.7–7.7)
Neutrophils Relative %: 72 %
Platelets: 377 10*3/uL (ref 150–400)
RBC: 4.96 MIL/uL (ref 3.87–5.11)
RDW: 13.2 % (ref 11.5–15.5)
WBC: 7.9 10*3/uL (ref 4.0–10.5)
nRBC: 0 % (ref 0.0–0.2)

## 2019-11-16 LAB — RAPID URINE DRUG SCREEN, HOSP PERFORMED
Amphetamines: NOT DETECTED
Barbiturates: NOT DETECTED
Benzodiazepines: NOT DETECTED
Cocaine: NOT DETECTED
Opiates: NOT DETECTED
Tetrahydrocannabinol: NOT DETECTED

## 2019-11-16 LAB — ETHANOL: Alcohol, Ethyl (B): 255 mg/dL — ABNORMAL HIGH (ref <10)

## 2019-11-16 LAB — SALICYLATE LEVEL: Salicylate Lvl: <7 mg/dL — ABNORMAL LOW (ref 7.0–30.0)

## 2019-11-16 LAB — ACETAMINOPHEN LEVEL: Acetaminophen (Tylenol), Serum: <10 ug/mL — ABNORMAL LOW (ref 10–30)

## 2019-11-16 LAB — SARS CORONAVIRUS 2 BY RT PCR (HOSPITAL ORDER, PERFORMED IN ~~LOC~~ HOSPITAL LAB): SARS Coronavirus 2: NEGATIVE

## 2019-11-16 MED ORDER — LISINOPRIL 20 MG PO TABS: 20.0000 mg | ORAL_TABLET | Freq: Every day | ORAL | Status: DC

## 2019-11-16 MED ORDER — ALPRAZOLAM 0.25 MG PO TABS: 0.2500 mg | ORAL_TABLET | Freq: Every day | ORAL | Status: DC | PRN

## 2019-11-16 MED ORDER — PANTOPRAZOLE SODIUM 40 MG PO TBEC
40.0000 mg | DELAYED_RELEASE_TABLET | Freq: Every day | ORAL | Status: DC
  Administered 2019-11-16: 40 mg via ORAL
  Filled 2019-11-16: qty 1

## 2019-11-16 MED ORDER — HYDROCHLOROTHIAZIDE 25 MG PO TABS: 25.0000 mg | ORAL_TABLET | Freq: Every day | ORAL | Status: DC

## 2019-11-16 MED ORDER — FLUOXETINE HCL 20 MG PO CAPS: 40.0000 mg | ORAL_CAPSULE | Freq: Every day | ORAL | Status: DC

## 2019-11-16 MED ORDER — LISINOPRIL-HYDROCHLOROTHIAZIDE 20-25 MG PO TABS: 1.0000 | ORAL_TABLET | Freq: Every day | ORAL | Status: DC

## 2019-11-16 MED ORDER — TRAZODONE HCL 100 MG PO TABS
100.0000 mg | ORAL_TABLET | Freq: Every day | ORAL | Status: DC
  Administered 2019-11-16: 100 mg via ORAL
  Filled 2019-11-16: qty 1
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
## 2019-11-16 NOTE — ED Notes (Signed)
Pt was changed into burgundy scrubs and been wanded by security. Pt belongings have been taken and this includes a purse with items, shirt, pants, and a pair of sneakers.

## 2019-11-17 ENCOUNTER — Ambulatory Visit (HOSPITAL_COMMUNITY)
Admission: EM | Admit: 2019-11-17 | Discharge: 2019-11-17 | Disposition: A | Discharge status: Home / Self Care | Payer: 59 | Attending: Nurse Practitioner | Admitting: Nurse Practitioner

## 2019-11-17 ENCOUNTER — Encounter (HOSPITAL_COMMUNITY): Payer: Self-pay

## 2019-11-17 ENCOUNTER — Other Ambulatory Visit: Payer: Self-pay

## 2019-11-17 DIAGNOSIS — F411 Generalized anxiety disorder: Secondary | ICD-10-CM

## 2019-11-17 DIAGNOSIS — F321 Major depressive disorder, single episode, moderate: Secondary | ICD-10-CM | POA: Diagnosis not present

## 2019-11-17 MED ORDER — ALUM & MAG HYDROXIDE-SIMETH 200-200-20 MG/5ML PO SUSP: 30.0000 mL | ORAL | Status: DC | PRN

## 2019-11-17 MED ORDER — HYDROXYZINE HCL 25 MG PO TABS: 25.0000 mg | ORAL_TABLET | Freq: Three times a day (TID) | ORAL | Status: DC | PRN

## 2019-11-17 MED ORDER — FLUOXETINE HCL 20 MG PO CAPS
40.0000 mg | ORAL_CAPSULE | Freq: Every day | ORAL | Status: DC
  Administered 2019-11-17: 40 mg via ORAL
  Filled 2019-11-17: qty 2

## 2019-11-17 MED ORDER — LISINOPRIL-HYDROCHLOROTHIAZIDE 20-25 MG PO TABS: 1.0000 | ORAL_TABLET | Freq: Every day | ORAL | Status: DC

## 2019-11-17 MED ORDER — MAGNESIUM HYDROXIDE 400 MG/5ML PO SUSP: 30.0000 mL | Freq: Every day | ORAL | Status: DC | PRN

## 2019-11-17 MED ORDER — PANTOPRAZOLE SODIUM 40 MG PO TBEC
40.0000 mg | DELAYED_RELEASE_TABLET | Freq: Every day | ORAL | Status: DC
  Administered 2019-11-17: 40 mg via ORAL
  Filled 2019-11-17: qty 1

## 2019-11-17 MED ORDER — HYDROCHLOROTHIAZIDE 25 MG PO TABS
25.0000 mg | ORAL_TABLET | Freq: Every day | ORAL | Status: DC
  Administered 2019-11-17: 25 mg via ORAL
  Filled 2019-11-17: qty 1

## 2019-11-17 MED ORDER — LISINOPRIL 20 MG PO TABS
20.0000 mg | ORAL_TABLET | Freq: Every day | ORAL | Status: DC
  Administered 2019-11-17: 20 mg via ORAL
  Filled 2019-11-17: qty 1

## 2019-11-17 MED ORDER — ACETAMINOPHEN 325 MG PO TABS: 650.0000 mg | ORAL_TABLET | Freq: Four times a day (QID) | ORAL | Status: DC | PRN

## 2019-11-17 MED ORDER — TRAZODONE HCL 100 MG PO TABS: 100.0000 mg | ORAL_TABLET | Freq: Every day | ORAL | Status: DC

## 2019-11-17 NOTE — Discharge Instructions (Addendum)
Continue activity as tolerated. Continue diet as recommended by your PCP. Ensure to keep all appointments with outpatient providers.   Continue follow-up with PCP to discuss resuming Xanax.   Follow-up with outpatient psychiatry and counseling with the list provided to you.
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
## 2019-11-17 NOTE — ED Provider Notes (Signed)
FBC/OBS ASAP Discharge Summary  Date and Time: 11/17/2019 9:53 AM  Name: Kristi Ramos  MRN:  [id number]   Discharge Diagnoses:  Final diagnoses:  MDD (major depressive disorder), single episode, moderate ([place])  GAD (generalized anxiety disorder)    Subjective: "I took the medications to help me sleep. It was not a suicide attempt."   Stay Summary: The patient was seen and examined this morning in the consult room. She is alert and oriented x 4. She appears fairly groomed and is causally dressed. She presents with fair eye contact, normal speech and tone, and logical/coherent thought content. She reports "no" to having suicidal and homicidal ideation. She denies auditory and visual hallucinations. She does not appear to be responding to internal or external stimuli. She is not exhibiting any alcohol withdrawal symptoms.   The patient gave verbal consent to contact her father Cater at [phone]. I discussed with the patient's father that the patient would be discharging home today and I would like to ensure that the gun has been removed from the patient's property. Mr. Lucila Maine stated that he did not have the patient's gun is his possession and that the gun case was empty when he went to Arayla's house to pick up her dog. He reported that he wasn't certain where the gun was located and maybe the police confiscated the gun when they arrived to the patient's home. I spoke with the patient and informed her that her father did not removed the gun from her house and believes the police may have confiscated the gun. The patient gave verbal consent for this provider to call the police department to see if the gun was confiscated by one of the police officers that came out to her home. I contacted the police department at [phone] and spoke to a representative named Janett Billow, who informed me that there was no record that the gun was removed from Chabely's home. She then transferred me to a Sgt.  Lennox-Spaltan who stated that the gun was placed on top of Sebastian's refrigerator at home. Sgt. Lennox-Spaltan stated that she would be glad to pick the patient up from the the [facility], take her home and confiscate the gun and provide [place] with a police report number. I spoke with the patient and informed her of the stated information. The patient agreed to be picked up by Sgt. Lennox-Spaltan and have the gun removed from her home. Per the patient's request, her  father Lucila Maine was notified and updated on the plan to removed the gun from Matha's house. Cater also agreed to the stated plan and voiced no safety concerns about the patient discharging home today.   I discussed with the patient outpatient psychiatry and counseling services. She was provided with a list of resources for psychiatry and counseling in the community. The patient agreed to the stated plan of care. She will continue to follow-up with her primary care physician for medication management. She was encouraged to speak with her PCP before resuming her Xanax due to overtaking the medication. The patient stated that she plans to go stay with her parents before returning to work on Monday, 9/27 and is able to contract for safety.  Labs reviewed. Vital signs reviewed. Medications reviewed.   Total Time spent with patient: 30 minutes  Past Psychiatric History: Major depressive disorder and Generalized anxiety disorder  Past Medical History:  Past Medical History:  Diagnosis Date  . ADD (attention deficit disorder)   . Anxiety   .  Back pain   . Cervical cancer (Newborn) 2010  . Chicken pox   . Depression 2009  . Gallbladder problem   . GERD (gastroesophageal reflux disease)   . HTN (hypertension)   . Seasonal allergies   . Uterine cancer ([place]) 2010    Past Surgical History:  Procedure Laterality Date  . ABDOMINAL HYSTERECTOMY  2010   TAH-- cancer  . ANKLE SURGERY Right    Osteomyelitis  . CHOLECYSTECTOMY    . LEG SURGERY     Calf Muscle  Surgery   Family History:  Family History  Problem Relation Age of Onset  . Arthritis Mother   . Stroke Mother   . Hypertension Mother   . Thyroid disease Mother   . Anxiety disorder Mother   . Arthritis Father   . Hyperlipidemia Father   . Heart disease Father   . Heart attack Father 36  . Hypertension Brother   . Drug abuse Son   . Alcohol abuse Paternal Grandmother   . Alcohol abuse Maternal Grandfather   . Heart disease Paternal Grandfather   . Heart attack Paternal Grandfather   . Depression Son    Family Psychiatric History: See above Social History:  Social History   Substance and Sexual Activity  Alcohol Use Yes   Comment: Occ     Social History   Substance and Sexual Activity  Drug Use No    Social History   Socioeconomic History  . Marital status: Legally Separated    Spouse name: Not on file  . Number of children: Not on file  . Years of education: Not on file  . Highest education level: Not on file  Occupational History  . Occupation: Art therapist    Employer: [employer]    Comment: dental hygiene  Tobacco Use  . Smoking status: Never Smoker  . Smokeless tobacco: Never Used  Vaping Use  . Vaping Use: Never used  Substance and Sexual Activity  . Alcohol use: Yes    Comment: Occ  . Drug use: No  . Sexual activity: Yes    Partners: Male  Other Topics Concern  . Not on file  Social History Narrative   Exercise--- just started back   Social Determinants of Health   Financial Resource Strain:   . Difficulty of Paying Living Expenses: Not on file  Food Insecurity:   . Worried About Charity fundraiser in the Last Year: Not on file  . Ran Out of Food in the Last Year: Not on file  Transportation Needs:   . Lack of Transportation (Medical): Not on file  . Lack of Transportation (Non-Medical): Not on file  Physical Activity:   . Days of Exercise per Week: Not on file  . Minutes of Exercise per Session: Not on file  Stress:    . Feeling of Stress : Not on file  Social Connections:   . Frequency of Communication with Friends and Family: Not on file  . Frequency of Social Gatherings with Friends and Family: Not on file  . Attends Religious Services: Not on file  . Active Member of Clubs or Organizations: Not on file  . Attends Archivist Meetings: Not on file  . Marital Status: Not on file   SDOH:  SDOH Screenings   Alcohol Screen:   . Last Alcohol Screening Score (AUDIT): Not on file  Depression (PHQ2-9):   . PHQ-2 Score: Not on file  Financial Resource Strain:   . Difficulty of  Paying Living Expenses: Not on file  Food Insecurity:   . Worried About Charity fundraiser in the Last Year: Not on file  . Ran Out of Food in the Last Year: Not on file  Housing:   . Last Housing Risk Score: Not on file  Physical Activity:   . Days of Exercise per Week: Not on file  . Minutes of Exercise per Session: Not on file  Social Connections:   . Frequency of Communication with Friends and Family: Not on file  . Frequency of Social Gatherings with Friends and Family: Not on file  . Attends Religious Services: Not on file  . Active Member of Clubs or Organizations: Not on file  . Attends Archivist Meetings: Not on file  . Marital Status: Not on file  Stress:   . Feeling of Stress : Not on file  Tobacco Use: Low Risk   . Smoking Tobacco Use: Never Smoker  . Smokeless Tobacco Use: Never Used  Transportation Needs:   . Film/video editor (Medical): Not on file  . Lack of Transportation (Non-Medical): Not on file    Has this patient used any form of tobacco in the last 30 days? (Cigarettes, Smokeless Tobacco, Cigars, and/or Pipes) A prescription for an FDA-approved tobacco cessation medication was offered at discharge and the patient refused  Current Medications:  Current Facility-Administered Medications  Medication Dose Route Frequency Provider Last Rate Last Admin  . acetaminophen  (TYLENOL) tablet 650 mg  650 mg Oral Q6H PRN Lindon Romp A, NP      . alum & mag hydroxide-simeth (MAALOX/MYLANTA) 200-200-20 MG/5ML suspension 30 mL  30 mL Oral Q4H PRN Lindon Romp A, NP      . FLUoxetine (PROZAC) capsule 40 mg  40 mg Oral Daily Lindon Romp A, NP   40 mg at 11/17/19 0859  . lisinopril (ZESTRIL) tablet 20 mg  20 mg Oral Daily Lindon Romp A, NP   20 mg at 11/17/19 1962   And  . hydrochlorothiazide (HYDRODIURIL) tablet 25 mg  25 mg Oral Daily Lindon Romp A, NP   25 mg at 11/17/19 0859  . hydrOXYzine (ATARAX/VISTARIL) tablet 25 mg  25 mg Oral TID PRN Lindon Romp A, NP      . magnesium hydroxide (MILK OF MAGNESIA) suspension 30 mL  30 mL Oral Daily PRN Lindon Romp A, NP      . pantoprazole (PROTONIX) EC tablet 40 mg  40 mg Oral Daily Lindon Romp A, NP   40 mg at 11/17/19 0859  . traZODone (DESYREL) tablet 100 mg  100 mg Oral QHS Rozetta Nunnery, NP       Current Outpatient Medications  Medication Sig Dispense Refill  . ALPRAZolam (XANAX) 0.25 MG tablet Take 1-2 tablets (0.25-0.5 mg total) by mouth daily as needed for anxiety. 20 tablet 0  . FLUoxetine (PROZAC) 40 MG capsule Take 1 capsule (40 mg total) by mouth daily. 15 capsule 0  . lisinopril-hydrochlorothiazide (ZESTORETIC) 20-25 MG tablet TAKE 1 TABLET DAILY (DOSE INCREASE) (Patient taking differently: Take 1 tablet by mouth daily. ) 90 tablet 3  . omeprazole (PRILOSEC) 20 MG capsule TAKE 1 CAPSULE DAILY (Patient taking differently: Take 20 mg by mouth daily. ) 90 capsule 3  . traZODone (DESYREL) 50 MG tablet Take 1-2 tablets (50-100 mg total) by mouth at bedtime as needed. for sleep (Patient taking differently: Take 100 mg by mouth at bedtime. for sleep) 60 tablet 2    PTA Medications: (  Not in a hospital admission)   Musculoskeletal  Strength & Muscle Tone: within normal limits Gait & Station: normal Patient leans: N/A  Psychiatric Specialty Exam  Presentation  General Appearance: Appropriate for  Environment;Casual;Fairly Groomed  Eye Contact:Fair  Speech:Clear and Coherent;Normal Rate  Speech Volume:Normal  Handedness:Right   Mood and Affect  Mood:Anxious  Affect:Congruent   Thought Process  Thought Processes:Coherent;Goal Directed  Descriptions of Associations:Intact  Orientation:Full (Time, Place and Person)  Thought Content:WDL  Hallucinations:Hallucinations: None  Ideas of Reference:None  Suicidal Thoughts:Suicidal Thoughts: No  Homicidal Thoughts:Homicidal Thoughts: No   Sensorium  Memory:Immediate Fair;Remote Fair;Recent Fair  Judgment:Fair  Insight:Fair   Executive Functions  Concentration:Fair  Attention Span:Fair  [place]   Psychomotor Activity  Psychomotor Activity:Psychomotor Activity: Normal   Assets  Assets:Communication Skills;Physical Health;Desire for Improvement;Financial Resources/Insurance;Housing;Leisure Time;Social Support;Transportation   Sleep  Sleep:Sleep: Fair   Physical Exam  Physical Exam Vitals and nursing note reviewed.  Constitutional:      General: She is not in acute distress.    Appearance: She is well-developed.  HENT:     Head: Normocephalic and atraumatic.  Eyes:     Conjunctiva/sclera: Conjunctivae normal.  Cardiovascular:     Rate and Rhythm: Normal rate and regular rhythm.     Heart sounds: No murmur heard.      Comments: Hypertensive (antihypertensive medications ordered).  Pulmonary:     Effort: Pulmonary effort is normal. No respiratory distress.     Breath sounds: Normal breath sounds.  Abdominal:     Palpations: Abdomen is soft.     Tenderness: There is no abdominal tenderness.  Musculoskeletal:        General: Normal range of motion.     Cervical back: Neck supple.  Skin:    General: Skin is warm and dry.  Neurological:     Mental Status: She is alert and oriented to person, place, and time.  Psychiatric:        Mood and  Affect: Mood normal.        Behavior: Behavior normal.        Thought Content: Thought content normal.        Judgment: Judgment normal.    Review of Systems  Constitutional: Negative.   HENT: Negative.   Eyes: Negative.   Respiratory: Negative.   Cardiovascular: Negative.   Gastrointestinal: Negative.   Genitourinary: Negative.   Musculoskeletal: Negative.   Skin: Negative.   Neurological: Negative.   Endo/Heme/Allergies: Negative.   Psychiatric/Behavioral: Positive for depression. The patient is nervous/anxious.    Blood pressure (!) 159/99, pulse 98, temperature 98 F (36.7 C), temperature source Oral, resp. rate 18, SpO2 100 %. There is no height or weight on file to calculate BMI.  Demographic Factors:  Divorced or widowed, Caucasian and Living alone  Loss Factors: Legal issues  Historical Factors: Impulsivity and NA  Risk Reduction Factors:   Sense of responsibility to family, Employed, Positive social support and Positive coping skills or problem solving skills  Continued Clinical Symptoms:  Depression:   Insomnia  Cognitive Features That Contribute To Risk:  None      Suicide Risk:  Minimal: No identifiable suicidal ideation.  Patients presenting with no risk factors but with morbid ruminations; may be classified as minimal risk based on the severity of the depressive symptoms  Plan Of Care/Follow-up recommendations:  Continue activity as tolerated. Continue diet as recommended by your PCP. Ensure to keep all appointments with outpatient providers.  Disposition:  Discharge home with [facility] and will live with parents for a few days  Lewis Shock, FNP 11/17/2019, 9:53 AM

## 2019-11-17 NOTE — ED Notes (Signed)
Meal given

## 2019-11-17 NOTE — ED Provider Notes (Signed)
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 Creatinine, Ser 06/29/2019 0.64  0.44 - 1.00 mg/dL Final  . Calcium 06/29/2019 8.9  8.9 - 10.3 mg/dL Final  . Total Protein 06/29/2019 6.8  6.5 - 8.1 g/dL Final  . Albumin 06/29/2019 4.0  3.5 - 5.0 g/dL Final  . AST 06/29/2019 35  15 - 41 U/L Final  . ALT 06/29/2019 63* 0 - 44 U/L Final  . Alkaline Phosphatase 06/29/2019 70  38 - 126 U/L Final  . Total Bilirubin 06/29/2019 1.3* 0.3 - 1.2 mg/dL Final  . GFR calc non Af Amer 06/29/2019 >60  >60  mL/min Final  . GFR calc Af Amer 06/29/2019 >60  >60 mL/min Final  . Anion gap 06/29/2019 14  5 - 15 Final   Performed at [facility] [address] [place], [place] [address]  . Troponin I (High Sensitivity) 06/29/2019 3  <18 ng/L Final   Comment: (NOTE) Elevated high sensitivity troponin I (hsTnI) values and significant  changes across serial measurements may suggest ACS but many other  chronic and acute conditions are known to elevate hsTnI results.  Refer to the "Links" section for chest pain algorithms and additional  guidance. Performed at [facility], [place] [address], [place],  [address]     Allergies: Moxifloxacin and Augmentin [amoxicillin-pot clavulanate]  PTA Medications: (Not in a hospital admission)   Medical Decision Making  Patient was medically cleared in the emergency department. ED labs reviewed. BAL 255, AST 77, ALT 110, UDS negative  Continue home medications Fluoxetine 40 mg daily for depression/anxiety Lisinopril/HCTZ 20-25 mg daily for HTN Trazodone 100 mg QHS prn for sleep Pantoprazole 40 mg daily for GERD      Recommendations  Based on my evaluation the patient does not appear to have an emergency medical condition.   Patient will be placed in the continuous assessment area at [facility] for treatment and stabilization. She will be reevaluated on 11/17/2019. The treatment team will determine disposition at that time.      Rozetta Nunnery, NP 11/17/19  1:45 AM

## 2019-11-17 NOTE — ED Notes (Signed)
Pt discharged in no acute distress. Denied SI/HI upon discharge. Verbalized understanding of all instructions reviewed on AVS. All pt belongings returned to pt intact from locker #7. Pt escorted to back sally[place] via staff for police transport back to pt home. Safety maintained.

## 2019-11-17 NOTE — ED Notes (Signed)
Pt sleeping in no acute distress. Safety maintained.

## 2019-11-17 NOTE — ED Notes (Signed)
Pt denies SI/HI. Calm, cooperative with staff. Safety maintained.

## 2019-11-17 NOTE — ED Triage Notes (Signed)
Pt arrived via [employer] from [place] for evaluation. Reports court and seeing ex husband today were overwhelming & she just wanted to 'escape'. Pt denies SI/HI/AVH. Reports being exhausted & just wanting to get some sleep. A&Ox4, calm & cooperative, mood and affect congruent.

## 2019-12-14 ENCOUNTER — Other Ambulatory Visit: Payer: Self-pay | Admitting: Family Medicine

## 2019-12-14 DIAGNOSIS — K219 Gastro-esophageal reflux disease without esophagitis: Secondary | ICD-10-CM

## 2019-12-30 ENCOUNTER — Other Ambulatory Visit: Payer: Self-pay | Admitting: Family Medicine

## 2019-12-30 DIAGNOSIS — G4700 Insomnia, unspecified: Secondary | ICD-10-CM

## 2020-01-25 ENCOUNTER — Encounter: Payer: Self-pay | Admitting: Family Medicine

## 2020-01-25 DIAGNOSIS — I10 Essential (primary) hypertension: Secondary | ICD-10-CM

## 2020-01-25 DIAGNOSIS — F419 Anxiety disorder, unspecified: Secondary | ICD-10-CM

## 2020-01-26 MED ORDER — FLUOXETINE HCL 40 MG PO CAPS: 40.0000 mg | ORAL_CAPSULE | Freq: Every day | ORAL | 5 refills | Status: DC

## 2020-01-26 MED ORDER — LISINOPRIL-HYDROCHLOROTHIAZIDE 20-25 MG PO TABS: 1.0000 | ORAL_TABLET | Freq: Every day | ORAL | 5 refills | Status: DC

## 2020-02-05 ENCOUNTER — Ambulatory Visit: Payer: 59 | Admitting: Family Medicine

## 2020-03-29 ENCOUNTER — Encounter: Payer: Self-pay | Admitting: Family Medicine

## 2020-03-30 ENCOUNTER — Other Ambulatory Visit: Payer: Self-pay

## 2020-03-30 MED ORDER — FLUOXETINE HCL 20 MG PO TABS: 20.0000 mg | ORAL_TABLET | Freq: Every day | ORAL | 0 refills | Status: DC

## 2020-03-30 NOTE — Telephone Encounter (Signed)
Lowerthe prozac to 20 mg #30  1 po qd -----she may be able to stop it after that

## 2020-05-06 ENCOUNTER — Ambulatory Visit: Payer: Self-pay | Admitting: Family Medicine

## 2020-05-12 ENCOUNTER — Encounter: Payer: Self-pay | Admitting: Family Medicine

## 2020-05-12 ENCOUNTER — Ambulatory Visit (INDEPENDENT_AMBULATORY_CARE_PROVIDER_SITE_OTHER): Payer: Self-pay | Admitting: Family Medicine

## 2020-05-12 ENCOUNTER — Other Ambulatory Visit: Payer: Self-pay

## 2020-05-12 DIAGNOSIS — G4700 Insomnia, unspecified: Secondary | ICD-10-CM

## 2020-05-12 DIAGNOSIS — K219 Gastro-esophageal reflux disease without esophagitis: Secondary | ICD-10-CM

## 2020-05-12 DIAGNOSIS — I10 Essential (primary) hypertension: Secondary | ICD-10-CM

## 2020-05-12 MED ORDER — LISINOPRIL-HYDROCHLOROTHIAZIDE 20-25 MG PO TABS: 1.0000 | ORAL_TABLET | Freq: Every day | ORAL | 5 refills | Status: DC

## 2020-05-12 MED ORDER — OMEPRAZOLE 20 MG PO CPDR: 20.0000 mg | DELAYED_RELEASE_CAPSULE | Freq: Every day | ORAL | 3 refills | Status: AC

## 2020-05-12 MED ORDER — TRAZODONE HCL 50 MG PO TABS: ORAL_TABLET | ORAL | 2 refills | Status: DC

## 2020-05-12 MED ORDER — LISINOPRIL-HYDROCHLOROTHIAZIDE 20-25 MG PO TABS: 1.0000 | ORAL_TABLET | Freq: Every day | ORAL | 3 refills | Status: AC

## 2020-05-12 NOTE — Progress Notes (Signed)
Patient ID: Kristi Ramos, female    DOB: [birth year]  Age: 50 y.o. MRN: [id number]    Subjective:  Subjective  HPI Melyna Huron presents for f/u bp and insomnia.   She is moving to [place] May 29, 2020.  She has no ins right now so can not afford to have labs today    Review of Systems  Constitutional: Negative for appetite change, diaphoresis, fatigue and unexpected weight change.  Eyes: Negative for pain, redness and visual disturbance.  Respiratory: Negative for cough, chest tightness, shortness of breath and wheezing.   Cardiovascular: Negative for chest pain, palpitations and leg swelling.  Endocrine: Negative for cold intolerance, heat intolerance, polydipsia, polyphagia and polyuria.  Genitourinary: Negative for difficulty urinating, dysuria and frequency.  Neurological: Negative for dizziness, light-headedness, numbness and headaches.    History Past Medical History:  Diagnosis Date  . ADD (attention deficit disorder)   . Anxiety   . Back pain   . Cervical cancer ([place]) 2010  . Chicken pox   . Depression 2009  . Gallbladder problem   . GERD (gastroesophageal reflux disease)   . HTN (hypertension)   . Seasonal allergies   . Uterine cancer ([place]) 2010    She has a past surgical history that includes Cholecystectomy; Ankle surgery (Right); Leg Surgery; and Abdominal hysterectomy (2010).   Her family history includes Alcohol abuse in her maternal grandfather and paternal grandmother; Anxiety disorder in her mother; Arthritis in her father and mother; Depression in her son; Drug abuse in her son; Heart attack in her paternal grandfather; Heart attack (age of onset: 71) in her father; Heart disease in her father and paternal grandfather; Hyperlipidemia in her father; Hypertension in her brother and mother; Stroke in her mother; Thyroid disease in her mother.She reports that she has never smoked. She has never used smokeless tobacco. She reports current  alcohol use. She reports that she does not use drugs.  No current outpatient medications on file prior to visit.   No current facility-administered medications on file prior to visit.     Objective:  Objective  Physical Exam Vitals and nursing note reviewed.  Constitutional:      Appearance: She is well-developed.  HENT:     Head: Normocephalic and atraumatic.  Eyes:     Conjunctiva/sclera: Conjunctivae normal.  Neck:     Thyroid: No thyromegaly.     Vascular: No carotid bruit or JVD.  Cardiovascular:     Rate and Rhythm: Normal rate and regular rhythm.     Heart sounds: Normal heart sounds. No murmur heard.   Pulmonary:     Effort: Pulmonary effort is normal. No respiratory distress.     Breath sounds: Normal breath sounds. No wheezing or rales.  Chest:     Chest wall: No tenderness.  Musculoskeletal:     Cervical back: Normal range of motion and neck supple.  Neurological:     Mental Status: She is alert and oriented to person, place, and time.    BP 120/80 (BP Location: Right Arm, Patient Position: Sitting, Cuff Size: Normal)   Pulse 74   Temp 98.1 F (36.7 C) (Oral)   Resp 18   Ht 5\' 2"  (1.575 m)   Wt 207 lb (93.9 kg)   SpO2 97%   BMI 37.86 kg/m  Wt Readings from Last 3 Encounters:  05/12/20 207 lb (93.9 kg)  11/16/19 211 lb (95.7 kg)  10/29/19 211 lb 6.4 oz (95.9 kg)     Lab  Results  Component Value Date   WBC 7.9 11/16/2019   HGB 15.7 (H) 11/16/2019   HCT 46.1 (H) 11/16/2019   PLT 377 11/16/2019   GLUCOSE 107 (H) 11/16/2019   CHOL 185 10/29/2019   TRIG 64 10/29/2019   HDL 80 10/29/2019   LDLCALC 90 10/29/2019   ALT 110 (H) 11/16/2019   AST 77 (H) 11/16/2019   NA 138 11/16/2019   K 3.9 11/16/2019   CL 97 (L) 11/16/2019   CREATININE 0.72 11/16/2019   BUN 11 11/16/2019   CO2 20 (L) 11/16/2019   TSH 1.63 08/28/2018   HGBA1C 5.9 08/08/2017    No results found.   Assessment & Plan:  Plan  I have discontinued Claretta B. Goyal's FLUoxetine. I  am also having her maintain her lisinopril-hydrochlorothiazide, traZODone, and omeprazole.  Meds ordered this encounter  Medications  . DISCONTD: lisinopril-hydrochlorothiazide (ZESTORETIC) 20-25 MG tablet    Sig: Take 1 tablet by mouth daily.    Dispense:  30 tablet    Refill:  5  . DISCONTD: traZODone (DESYREL) 50 MG tablet    Sig: TAKE 1 TO 2 TABLETS BY MOUTH AT BEDTIME IF NEEDED FOR SLEEP    Dispense:  60 tablet    Refill:  2  . lisinopril-hydrochlorothiazide (ZESTORETIC) 20-25 MG tablet    Sig: Take 1 tablet by mouth daily.    Dispense:  90 tablet    Refill:  3  . traZODone (DESYREL) 50 MG tablet    Sig: TAKE 1 TO 2 TABLETS BY MOUTH AT BEDTIME IF NEEDED FOR SLEEP    Dispense:  60 tablet    Refill:  2  . omeprazole (PRILOSEC) 20 MG capsule    Sig: Take 1 capsule (20 mg total) by mouth daily.    Dispense:  90 capsule    Refill:  3    Problem List Items Addressed This Visit      Unprioritized   Essential hypertension   Relevant Medications   lisinopril-hydrochlorothiazide (ZESTORETIC) 20-25 MG tablet   HTN (hypertension)    Well controlled, no changes to meds. Encouraged heart healthy diet such as the DASH diet and exercise as tolerated.        Relevant Medications   lisinopril-hydrochlorothiazide (ZESTORETIC) 20-25 MG tablet   Insomnia   Relevant Medications   traZODone (DESYREL) 50 MG tablet    Other Visit Diagnoses    Gastroesophageal reflux disease without esophagitis       Relevant Medications   omeprazole (PRILOSEC) 20 MG capsule      Follow-up: Return in about 6 months (around 11/12/2020), or if symptoms worsen or fail to improve, for annual exam, fasting.  Ann Held, DO

## 2020-05-12 NOTE — Patient Instructions (Signed)

## 2020-05-12 NOTE — Assessment & Plan Note (Signed)
Well controlled, no changes to meds. Encouraged heart healthy diet such as the DASH diet and exercise as tolerated.  °

## 2020-10-01 ENCOUNTER — Other Ambulatory Visit: Payer: Self-pay | Admitting: Family Medicine

## 2020-10-01 DIAGNOSIS — G4700 Insomnia, unspecified: Secondary | ICD-10-CM

## 2020-12-13 ENCOUNTER — Other Ambulatory Visit: Payer: Self-pay | Admitting: Family Medicine

## 2020-12-13 DIAGNOSIS — G4700 Insomnia, unspecified: Secondary | ICD-10-CM

## 2022-11-14 ENCOUNTER — Telehealth: Payer: Self-pay | Admitting: Family Medicine

## 2022-11-14 NOTE — Telephone Encounter (Signed)
Pt has a new job and needs to know if and when she had the Hep B titer and documentation to support she has had it. Pt would like a call [phone]) to advise of the date so she can notify her employer. She would like for the documentation to be mailed to her new home address: [address] [place] [address].

## 2022-11-15 ENCOUNTER — Other Ambulatory Visit: Payer: Self-pay

## 2022-11-15 DIAGNOSIS — Z23 Encounter for immunization: Secondary | ICD-10-CM

## 2022-11-15 NOTE — Telephone Encounter (Signed)
Labs have been ordered. Please have patient schedule a lab visit.

## 2022-11-16 NOTE — Telephone Encounter (Signed)
Pt called to follow up on her previous call. Advised that the provider could not locate a Hep B Titer in her chart so an order was placed. Provider is not in the area so she is going to have a provider where she is give her the titer.
